# Patient Record
Sex: Female | Born: 1950 | Race: White | Hispanic: No | Marital: Married | Smoking: Former smoker
Health system: Southern US, Community
[De-identification: ages and names within clinical notes are randomized; demographics above are authoritative.]

## PROBLEM LIST (undated history)

## (undated) DIAGNOSIS — J309 Allergic rhinitis, unspecified: Secondary | ICD-10-CM

## (undated) DIAGNOSIS — Z87448 Personal history of other diseases of urinary system: Secondary | ICD-10-CM

## (undated) DIAGNOSIS — N393 Stress incontinence (female) (male): Secondary | ICD-10-CM

## (undated) DIAGNOSIS — F19959 Other psychoactive substance use, unspecified with psychoactive substance-induced psychotic disorder, unspecified: Secondary | ICD-10-CM

## (undated) DIAGNOSIS — K5090 Crohn's disease, unspecified, without complications: Secondary | ICD-10-CM

## (undated) DIAGNOSIS — C801 Malignant (primary) neoplasm, unspecified: Secondary | ICD-10-CM

## (undated) DIAGNOSIS — R112 Nausea with vomiting, unspecified: Secondary | ICD-10-CM

## (undated) DIAGNOSIS — R569 Unspecified convulsions: Secondary | ICD-10-CM

## (undated) DIAGNOSIS — Z8719 Personal history of other diseases of the digestive system: Secondary | ICD-10-CM

## (undated) DIAGNOSIS — M810 Age-related osteoporosis without current pathological fracture: Secondary | ICD-10-CM

## (undated) DIAGNOSIS — Z9889 Other specified postprocedural states: Secondary | ICD-10-CM

## (undated) DIAGNOSIS — M1990 Unspecified osteoarthritis, unspecified site: Secondary | ICD-10-CM

## (undated) DIAGNOSIS — Z8742 Personal history of other diseases of the female genital tract: Secondary | ICD-10-CM

## (undated) DIAGNOSIS — N819 Female genital prolapse, unspecified: Secondary | ICD-10-CM

## (undated) HISTORY — PX: EXPLORATORY LAPAROTOMY: SUR591

## (undated) HISTORY — PX: WISDOM TOOTH EXTRACTION: SHX21

## (undated) HISTORY — PX: OTHER SURGICAL HISTORY: SHX169

---

## 1978-11-03 HISTORY — PX: ABDOMINAL HYSTERECTOMY: SHX81

## 1994-11-03 HISTORY — PX: OTHER SURGICAL HISTORY: SHX169

## 1994-11-03 HISTORY — PX: BREAST EXCISIONAL BIOPSY: SUR124

## 1999-11-21 ENCOUNTER — Ambulatory Visit (HOSPITAL_COMMUNITY): Admission: RE | Admit: 1999-11-21 | Discharge: 1999-11-21 | Payer: Self-pay | Admitting: Gastroenterology

## 2000-01-31 ENCOUNTER — Encounter: Payer: Self-pay | Admitting: Family Medicine

## 2000-01-31 ENCOUNTER — Encounter: Admission: RE | Admit: 2000-01-31 | Discharge: 2000-01-31 | Payer: Self-pay | Admitting: Family Medicine

## 2000-03-20 ENCOUNTER — Encounter: Admission: RE | Admit: 2000-03-20 | Discharge: 2000-03-20 | Payer: Self-pay | Admitting: Obstetrics and Gynecology

## 2000-03-20 ENCOUNTER — Encounter: Payer: Self-pay | Admitting: Obstetrics and Gynecology

## 2000-06-08 ENCOUNTER — Ambulatory Visit (HOSPITAL_COMMUNITY): Admission: RE | Admit: 2000-06-08 | Discharge: 2000-06-08 | Payer: Self-pay | Admitting: Obstetrics and Gynecology

## 2000-06-08 ENCOUNTER — Encounter (INDEPENDENT_AMBULATORY_CARE_PROVIDER_SITE_OTHER): Payer: Self-pay | Admitting: Specialist

## 2000-06-08 HISTORY — PX: OTHER SURGICAL HISTORY: SHX169

## 2001-03-25 ENCOUNTER — Encounter: Payer: Self-pay | Admitting: Internal Medicine

## 2001-03-25 ENCOUNTER — Encounter: Admission: RE | Admit: 2001-03-25 | Discharge: 2001-03-25 | Payer: Self-pay | Admitting: Internal Medicine

## 2002-03-29 ENCOUNTER — Encounter: Payer: Self-pay | Admitting: Internal Medicine

## 2002-03-29 ENCOUNTER — Encounter: Admission: RE | Admit: 2002-03-29 | Discharge: 2002-03-29 | Payer: Self-pay | Admitting: Internal Medicine

## 2003-04-04 ENCOUNTER — Encounter: Payer: Self-pay | Admitting: Internal Medicine

## 2003-04-04 ENCOUNTER — Encounter: Admission: RE | Admit: 2003-04-04 | Discharge: 2003-04-04 | Payer: Self-pay | Admitting: Internal Medicine

## 2003-06-14 ENCOUNTER — Encounter: Payer: Self-pay | Admitting: Internal Medicine

## 2003-06-14 ENCOUNTER — Encounter: Admission: RE | Admit: 2003-06-14 | Discharge: 2003-06-14 | Payer: Self-pay | Admitting: Internal Medicine

## 2004-04-04 ENCOUNTER — Encounter: Admission: RE | Admit: 2004-04-04 | Discharge: 2004-04-04 | Payer: Self-pay | Admitting: Internal Medicine

## 2005-04-07 ENCOUNTER — Encounter: Admission: RE | Admit: 2005-04-07 | Discharge: 2005-04-07 | Payer: Self-pay | Admitting: Internal Medicine

## 2005-05-13 ENCOUNTER — Encounter: Admission: RE | Admit: 2005-05-13 | Discharge: 2005-05-13 | Payer: Self-pay | Admitting: Internal Medicine

## 2005-09-29 ENCOUNTER — Encounter: Admission: RE | Admit: 2005-09-29 | Discharge: 2005-09-29 | Payer: Self-pay | Admitting: Family Medicine

## 2006-04-08 ENCOUNTER — Encounter: Admission: RE | Admit: 2006-04-08 | Discharge: 2006-04-08 | Payer: Self-pay | Admitting: Internal Medicine

## 2007-04-21 ENCOUNTER — Encounter: Admission: RE | Admit: 2007-04-21 | Discharge: 2007-04-21 | Payer: Self-pay | Admitting: *Deleted

## 2008-04-21 ENCOUNTER — Encounter: Admission: RE | Admit: 2008-04-21 | Discharge: 2008-04-21 | Payer: Self-pay | Admitting: Family Medicine

## 2008-06-06 ENCOUNTER — Encounter: Admission: RE | Admit: 2008-06-06 | Discharge: 2008-06-06 | Payer: Self-pay | Admitting: Family Medicine

## 2008-06-20 ENCOUNTER — Encounter: Admission: RE | Admit: 2008-06-20 | Discharge: 2008-06-20 | Payer: Self-pay | Admitting: Family Medicine

## 2009-04-23 ENCOUNTER — Encounter: Admission: RE | Admit: 2009-04-23 | Discharge: 2009-04-23 | Payer: Self-pay | Admitting: Family Medicine

## 2010-01-03 IMAGING — CR DG HIP (WITH OR WITHOUT PELVIS) 2-3V*L*
2 series · 2 of 2 positions shown · non-contrast
Comparison: CT pelvis of 04/21/2008

CLINICAL DATA: Left hip and groin pain, no known injury

LEFT HIP - COMPLETE 2+ VIEW

[view not recorded (1 of 2)]
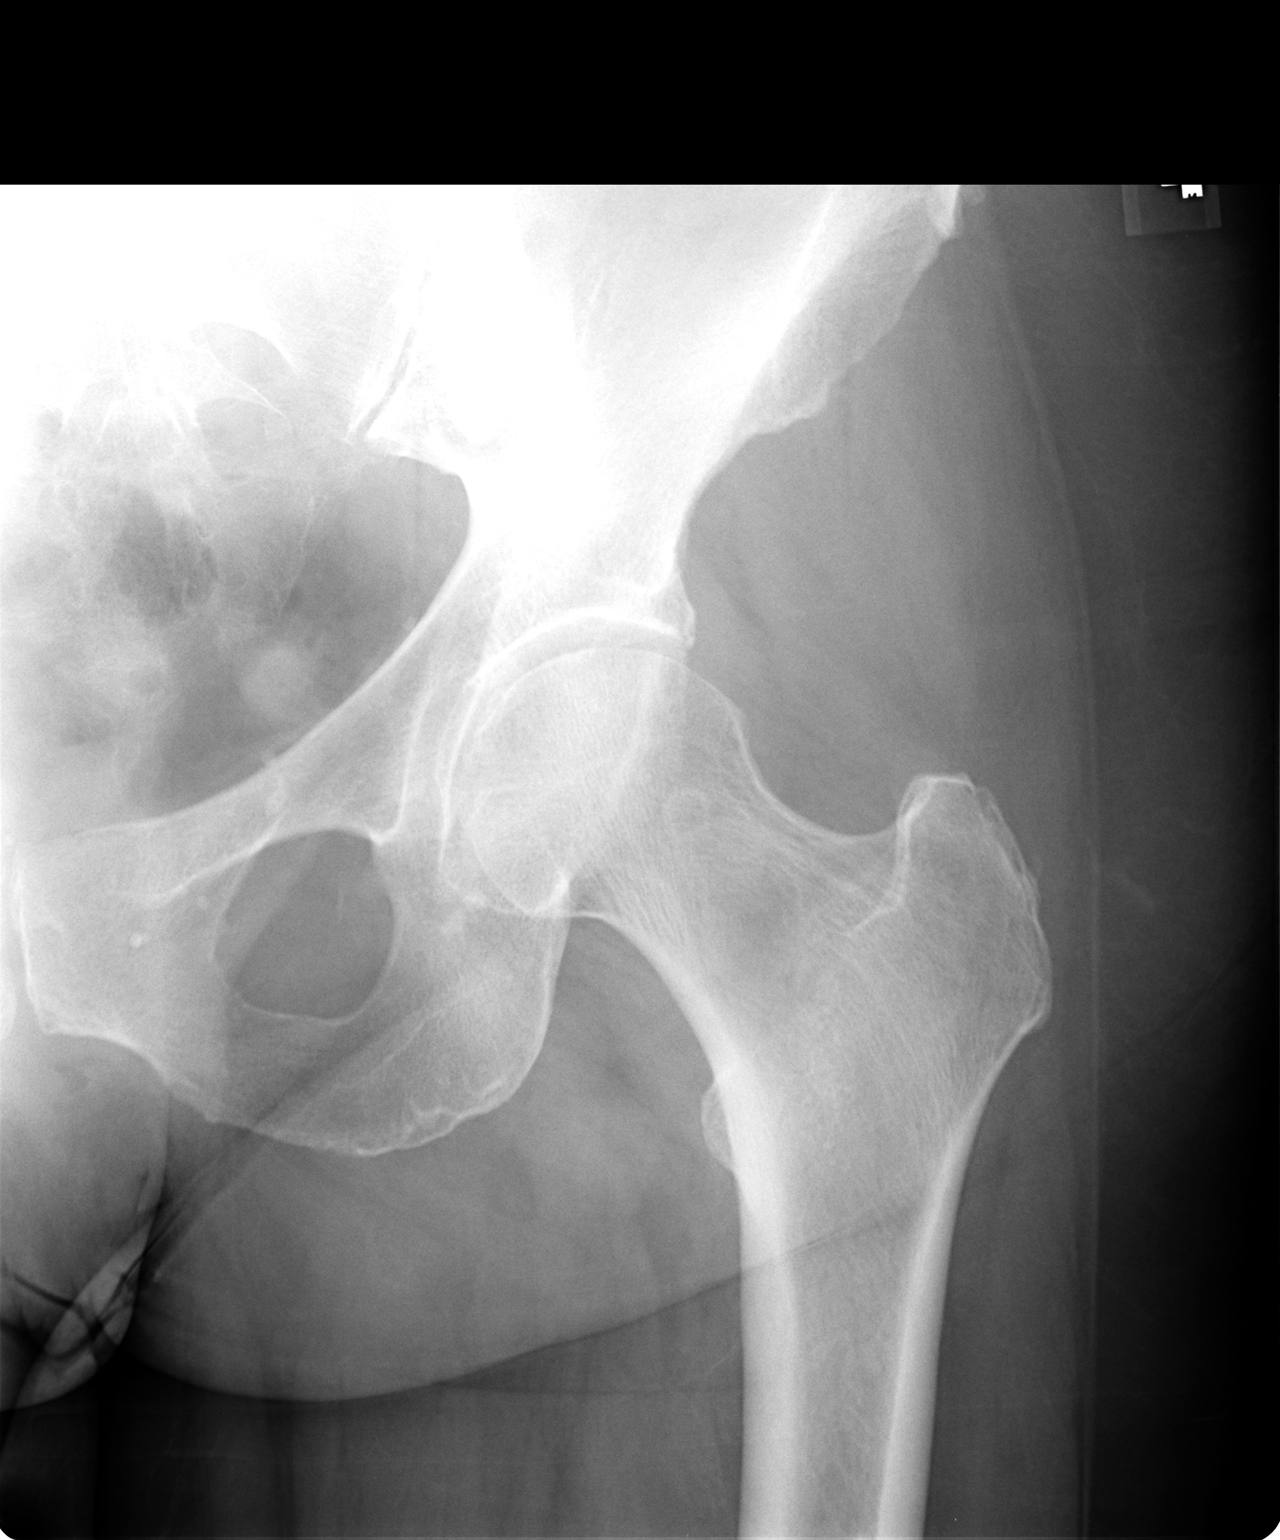

[view not recorded (2 of 2)]
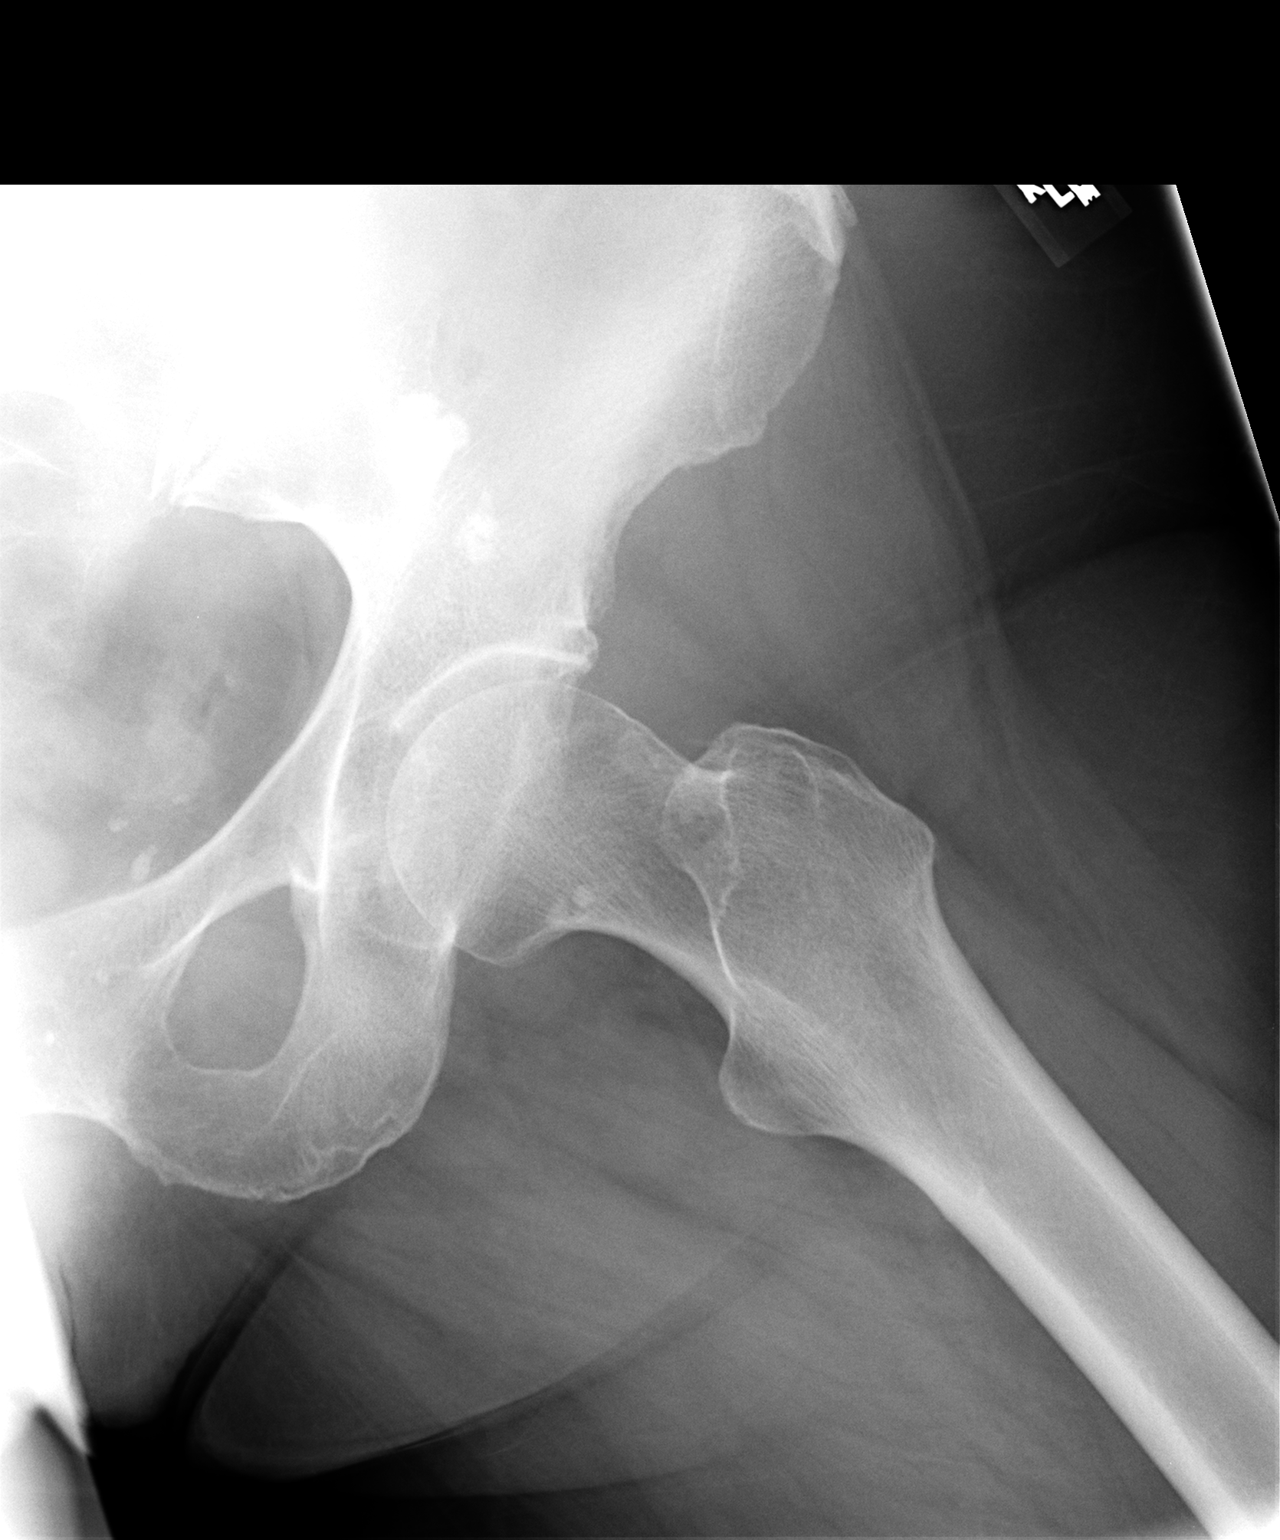

[2 of 2 positions shown; findings below may reference images not displayed]

FINDINGS: The left hip joint space appears normal with no
significant degenerative change.  No acute abnormality is seen.
The left ramus is intact and the left SI joint appears normal.
Calcifications are noted overlying the left iliac bone.  In
correlation with the prior CT of the pelvis, these calcifications
represent calcified left external iliac chain nodes.
IMPRESSION: No significant abnormality.

## 2010-04-24 ENCOUNTER — Encounter: Admission: RE | Admit: 2010-04-24 | Discharge: 2010-04-24 | Payer: Self-pay | Admitting: Family Medicine

## 2010-12-05 ENCOUNTER — Other Ambulatory Visit: Payer: Self-pay | Admitting: Internal Medicine

## 2010-12-05 DIAGNOSIS — Z1231 Encounter for screening mammogram for malignant neoplasm of breast: Secondary | ICD-10-CM

## 2010-12-05 DIAGNOSIS — Z1239 Encounter for other screening for malignant neoplasm of breast: Secondary | ICD-10-CM

## 2010-12-27 ENCOUNTER — Other Ambulatory Visit: Payer: Self-pay | Admitting: Family Medicine

## 2010-12-27 ENCOUNTER — Ambulatory Visit
Admission: RE | Admit: 2010-12-27 | Discharge: 2010-12-27 | Disposition: A | Discharge status: Home / Self Care | Payer: 59 | Source: Ambulatory Visit | Attending: Family Medicine | Admitting: Family Medicine

## 2010-12-27 DIAGNOSIS — R1031 Right lower quadrant pain: Secondary | ICD-10-CM

## 2010-12-27 MED ORDER — IOHEXOL 300 MG/ML  SOLN
100.0000 mL | Freq: Once | INTRAMUSCULAR | Status: AC | PRN
  Administered 2010-12-27: 100 mL via INTRAVENOUS

## 2011-01-08 ENCOUNTER — Emergency Department (INDEPENDENT_AMBULATORY_CARE_PROVIDER_SITE_OTHER): Payer: 59

## 2011-01-08 ENCOUNTER — Emergency Department (HOSPITAL_BASED_OUTPATIENT_CLINIC_OR_DEPARTMENT_OTHER)
Admission: EM | Admit: 2011-01-08 | Discharge: 2011-01-09 | Disposition: A | Discharge status: Nursing Home (Includes ICF) | Payer: 59 | Attending: Emergency Medicine | Admitting: Emergency Medicine

## 2011-01-08 DIAGNOSIS — K56609 Unspecified intestinal obstruction, unspecified as to partial versus complete obstruction: Secondary | ICD-10-CM | POA: Insufficient documentation

## 2011-01-08 DIAGNOSIS — Z79899 Other long term (current) drug therapy: Secondary | ICD-10-CM | POA: Insufficient documentation

## 2011-01-08 DIAGNOSIS — R1110 Vomiting, unspecified: Secondary | ICD-10-CM

## 2011-01-08 DIAGNOSIS — R1033 Periumbilical pain: Secondary | ICD-10-CM | POA: Insufficient documentation

## 2011-01-08 LAB — DIFFERENTIAL
Basophils Absolute: 0 10*3/uL (ref 0.0–0.1)
Basophils Relative: 0 % (ref 0–1)
Lymphocytes Relative: 5 % — ABNORMAL LOW (ref 12–46)
Monocytes Relative: 6 % (ref 3–12)
Neutro Abs: 18.9 10*3/uL — ABNORMAL HIGH (ref 1.7–7.7)

## 2011-01-08 LAB — COMPREHENSIVE METABOLIC PANEL
CO2: 24 mEq/L (ref 19–32)
Chloride: 103 mEq/L (ref 96–112)
GFR calc non Af Amer: >60 mL/min (ref >60)
Sodium: 144 mEq/L (ref 135–145)
Total Bilirubin: 0.8 mg/dL (ref 0.3–1.2)

## 2011-01-08 LAB — URINALYSIS, ROUTINE W REFLEX MICROSCOPIC
Hgb urine dipstick: NEGATIVE
Nitrite: NEGATIVE
Protein, ur: NEGATIVE mg/dL
Specific Gravity, Urine: 1.028 (ref 1.005–1.030)
Urobilinogen, UA: 0.2 mg/dL (ref 0.0–1.0)

## 2011-01-08 LAB — CBC
HCT: 42.3 % (ref 36.0–46.0)
Hemoglobin: 14.6 g/dL (ref 12.0–15.0)
MCV: 86.7 fL (ref 78.0–100.0)
RDW: 13.4 % (ref 11.5–15.5)

## 2011-01-08 MED ORDER — IOHEXOL 300 MG/ML  SOLN
100.0000 mL | Freq: Once | INTRAMUSCULAR | Status: AC | PRN
  Administered 2011-01-08: 100 mL via INTRAVENOUS

## 2011-01-09 ENCOUNTER — Inpatient Hospital Stay (HOSPITAL_COMMUNITY): Payer: 59

## 2011-01-09 ENCOUNTER — Inpatient Hospital Stay (HOSPITAL_COMMUNITY)
Admission: AD | Admit: 2011-01-09 | Discharge: 2011-01-16 | DRG: 390 | Disposition: A | Discharge status: Home / Self Care | Payer: 59 | Source: Other Acute Inpatient Hospital | Attending: Surgery | Admitting: Surgery

## 2011-01-09 DIAGNOSIS — K56609 Unspecified intestinal obstruction, unspecified as to partial versus complete obstruction: Principal | ICD-10-CM | POA: Diagnosis present

## 2011-01-09 DIAGNOSIS — Z87440 Personal history of urinary (tract) infections: Secondary | ICD-10-CM

## 2011-01-09 LAB — BASIC METABOLIC PANEL
Chloride: 105 mEq/L (ref 96–112)
GFR calc non Af Amer: >60 mL/min (ref >60)
Glucose, Bld: 98 mg/dL (ref 70–99)
Potassium: 3.7 mEq/L (ref 3.5–5.1)
Sodium: 139 mEq/L (ref 135–145)

## 2011-01-09 LAB — CBC
HCT: 37.5 % (ref 36.0–46.0)
Hemoglobin: 12.7 g/dL (ref 12.0–15.0)
RDW: 13.8 % (ref 11.5–15.5)

## 2011-01-10 ENCOUNTER — Inpatient Hospital Stay (HOSPITAL_COMMUNITY): Payer: 59

## 2011-01-10 LAB — BASIC METABOLIC PANEL
BUN: 3 mg/dL — ABNORMAL LOW (ref 6–23)
CO2: 28 mEq/L (ref 19–32)
Glucose, Bld: 114 mg/dL — ABNORMAL HIGH (ref 70–99)
Potassium: 3.7 mEq/L (ref 3.5–5.1)
Sodium: 138 mEq/L (ref 135–145)

## 2011-01-10 LAB — CBC
HCT: 37.8 % (ref 36.0–46.0)
Hemoglobin: 12.4 g/dL (ref 12.0–15.0)
MCV: 91.7 fL (ref 78.0–100.0)
WBC: 10.7 10*3/uL — ABNORMAL HIGH (ref 4.0–10.5)

## 2011-01-11 NOTE — H&P (Signed)
NAME:  Diane [name], Diane [name]                ACCOUNT NO.:  [id number]  MEDICAL RECORD NO.:  [id number]           PATIENT TYPE:  I  LOCATION:  5409                         FACILITY:  [facility]  PHYSICIAN:  Stark Klein, MD       DATE OF BIRTH:  01/06/1951  DATE OF ADMISSION:  01/09/2011 DATE OF DISCHARGE:                             HISTORY & PHYSICAL   CHIEF COMPLAINT:  Partial small bowel obstruction.  HISTORY OF PRESENT ILLNESS:  Diane [name] is a 60 year old female who had severe abdominal pain near her umbilicus around 2 weeks ago.  She had nausea, vomiting and was diagnosed with an UTI at that time.  She was placed on Cipro, the pain did resolve, and she felt good for around a week.  She had a CT scan at that time that was to rule out appendicitis and diverticulitis and this was negative.  Last night at 5:00 p.m., she had acute abdominal pain and was on pain medication.  She described this as a stabbing pain that did not get better, no matter what she tried to do.  It was worse with movement.  Since she has gotten several doses of pain medicine and since the NG tube was placed, her pain was significantly better and almost gone.  She has had belching for the last couple of days and no flatus that she is aware of.  She did have a bowel movement today.  Prior to these 2 episodes, she has never experienced pain like this before.  PAST MEDICAL HISTORY:  Significant for UTI recently, also for endometriosis with pelvic pain and left ovarian cyst.  She has had nausea and vomiting with both episodes.  PAST SURGICAL HISTORY:  D and C, hysterectomy and then left salpingo- oophorectomy and lysis adhesions for removal of hydatid cyst.  FAMILY HISTORY:  No significant illnesses that she is aware of.  SOCIAL HISTORY:  She is married and accompanied by her husband.  She does not abuse tobacco, alcohol or drugs, lives in [place].  REVIEW OF SYSTEMS:  Positive for flank pain, otherwise negative  x11 systems.  MEDICATIONS:  She has not been taking anything other than her Zyrtec and Flonase the past couple of days but her normal medications include Zyrtec, multivitamins, Flonase, fish oil, calcium citrate, GLIZONE and RESTASIS.  ALLERGIES:  TOBRADEX, NEOSPORIN, CELEBREX, CHANTIX, and BISPHOSPHONATES.  PHYSICAL EXAMINATION:  GENERAL:  She is sleeping but arousable. VITAL SIGNS:  Temperature is 98.1, heart rate 98, blood pressure 114/52, respiratory rate 20, sats 94% on room air. GENERAL:  Once awakened, she is alert and oriented x3 and does not appear uncomfortable. HEENT:  Normocephalic, atraumatic.  Sclerae are anicteric.  Mucous membranes are moist. NECK:  Supple.  No lymphadenopathy.  No thyromegaly.  Trachea is midline. HEART:  Regular rate and rhythm.  No murmurs. LUNGS:  Clear to auscultation bilaterally. ABDOMEN:  Soft, nondistended, nontender.  Hypoactive bowel sounds are present. EXTREMITIES:  Warm, well perfused without pitting edema. SKIN:  No evidence of rashes. NEURO:  No gross motor sensory deficits. PSYCH:  Mood and affect are normal.  LABORATORY FINDINGS:  UA shows bilirubin and ketones.  Lipase 68, sodium 144, potassium 3.9, chloride 103, CO2 24, BUN 15, creatinine 0.7, glucose 120.  LFTs, alkaline phosphatase is slightly elevated at 118, AST, ALT 25 and 23, albumin 4.5, calcium 10.1.  White count 21.5, hemoglobin and hematocrit 14.6 and 42.3 and platelet count 398,000.  CT scan on February 24 was negative other than some shotty mesenteric lymphadenopathy.  CT on March 8 shows small bowel obstruction with transition point, no free air.  No mass is seen, fecalization of small bowel and thickening of small bowel.  No evidence of free air.  IMPRESSION:  Mr. Diane [name] is a 60 year old female with partial small bowel obstruction.  We will keep her n.p.o., give her NG tube, IV fluids and recheck electrolytes.  She may require exploratory laparotomy if she does  not open up.  I described this plan of care to the patient and her husband.     Stark Klein, MD     FB/MEDQ  D:  01/09/2011  T:  01/09/2011  Job:  [id number]  Electronically Signed by Stark Klein MD on 01/11/2011 06:32:23 AM

## 2011-01-12 ENCOUNTER — Inpatient Hospital Stay (HOSPITAL_COMMUNITY): Payer: 59

## 2011-01-12 LAB — CBC
HCT: 39.4 % (ref 36.0–46.0)
Hemoglobin: 13 g/dL (ref 12.0–15.0)
MCH: 30.2 pg (ref 26.0–34.0)
MCHC: 33 g/dL (ref 30.0–36.0)
MCV: 91.4 fL (ref 78.0–100.0)

## 2011-01-12 LAB — BASIC METABOLIC PANEL
BUN: 5 mg/dL — ABNORMAL LOW (ref 6–23)
CO2: 30 mEq/L (ref 19–32)
Calcium: 9.5 mg/dL (ref 8.4–10.5)
Creatinine, Ser: 0.64 mg/dL (ref 0.4–1.2)
Glucose, Bld: 104 mg/dL — ABNORMAL HIGH (ref 70–99)

## 2011-01-13 LAB — CBC
Hemoglobin: 12.2 g/dL (ref 12.0–15.0)
MCH: 29.9 pg (ref 26.0–34.0)
MCHC: 33 g/dL (ref 30.0–36.0)
Platelets: 297 10*3/uL (ref 150–400)
RDW: 13.5 % (ref 11.5–15.5)

## 2011-01-13 LAB — BASIC METABOLIC PANEL
CO2: 29 mEq/L (ref 19–32)
Calcium: 9 mg/dL (ref 8.4–10.5)
Creatinine, Ser: 0.63 mg/dL (ref 0.4–1.2)
GFR calc Af Amer: >60 mL/min (ref >60)
GFR calc non Af Amer: >60 mL/min (ref >60)

## 2011-01-14 ENCOUNTER — Inpatient Hospital Stay (HOSPITAL_COMMUNITY): Payer: 59

## 2011-01-14 LAB — BASIC METABOLIC PANEL
Calcium: 9.3 mg/dL (ref 8.4–10.5)
Chloride: 100 mEq/L (ref 96–112)
Creatinine, Ser: 0.71 mg/dL (ref 0.4–1.2)
GFR calc Af Amer: >60 mL/min (ref >60)

## 2011-01-14 LAB — CBC
MCH: 29.8 pg (ref 26.0–34.0)
MCHC: 32.7 g/dL (ref 30.0–36.0)
Platelets: 330 10*3/uL (ref 150–400)
RBC: 4.46 MIL/uL (ref 3.87–5.11)

## 2011-01-14 LAB — MAGNESIUM: Magnesium: 1.9 mg/dL (ref 1.5–2.5)

## 2011-01-14 LAB — LIPASE, BLOOD: Lipase: 89 U/L — ABNORMAL HIGH (ref 11–59)

## 2011-01-15 ENCOUNTER — Inpatient Hospital Stay (HOSPITAL_COMMUNITY): Payer: 59

## 2011-01-15 LAB — CBC
Platelets: 290 10*3/uL (ref 150–400)
RDW: 13.7 % (ref 11.5–15.5)
WBC: 9.1 10*3/uL (ref 4.0–10.5)

## 2011-01-15 LAB — DIFFERENTIAL
Basophils Absolute: 0 10*3/uL (ref 0.0–0.1)
Eosinophils Absolute: 0.3 10*3/uL (ref 0.0–0.7)
Eosinophils Relative: 4 % (ref 0–5)

## 2011-01-24 NOTE — Discharge Summary (Signed)
NAME:  Diane [name], Diane [name]                ACCOUNT NO.:  [id number]  MEDICAL RECORD NO.:  [id number]           PATIENT TYPE:  I  LOCATION:  4401                         FACILITY:  [facility]  PHYSICIAN:  Fenton Malling. Lucia Gaskins, M.D.  DATE OF BIRTH:  July 07, 1951  DATE OF ADMISSION:  01/09/2011 DATE OF DISCHARGE:  01/16/2011                        DISCHARGE SUMMARY - REFERRING   ADMISSION DIAGNOSES: 1. Partial small bowel obstruction. 2. Recent urinary tract infection. 3. History of D and C and hysterectomy. 4. History of lysis of adhesions.  DISCHARGE DIAGNOSES: 1. Partial small bowel obstruction. 2. Recent urinary tract infection. 3. History of D and C and hysterectomy. 4. History of lysis of adhesions.  PROCEDURES:  CT of the abdomen and pelvis, January 09, 2011, finding of small bowel obstruction discrete transition point identified at the anterior mid lower  pelvis, no mass was seen in the transition point, small amount of free pelvic fluid, no free air or abscess.  BRIEF HISTORY:  The patient is a 60 year old white female, who presented on January 09, 2011, with abdominal pain near umbilicus started around 2 weeks ago.  She has developed progressive discomfort, nausea, and vomiting.  She is recently diagnosed with UTI and placed on Cipro.  The pain did not resolve.  CT scan was obtained to rule out appendicitis and diverticulitis and this was negative.  On the night prior to admission, January 08, 2011, she developed acute abdominal pain.  She describes as stabbing.  It did not get better.  No matter what she tried, was worse with movement.  She was ultimately seen in the ER here, underwent studies, which led to admission.  PAST MEDICAL HISTORY: 1. Significant for UTI recently 2.Endometriosis with pelvic pain, 3.Left ovarian cyst.  4. She is also reports nausea and vomiting with each of these episodes.  PAST SURGICAL HISTORY:  Includes: 1.D and C, 2. Hysterectomy, left salpingo- oophorectomy with  lysis of adhesions for removal of hydatid cyst.  ALLERGIES:  TOBRADEX, NEOSPORIN, CELEBREX, CHANTIX, AND BIPHOSPHATES.  HOME MEDICATIONS:  Included Zyrtec, Flonase, multivitamin, fish oil, calcium, Glizone, and Restasis.  For further history and physical please see the dictated note.  HOSPITAL COURSE:  The patient was admitted, placed on IVs and bowel rest.  She continued to have problems with the fair amount of drainage over the next several days.  Serial abdominal films were obtained.  The NG was clampped on January 12, 2011, because of the  drainage was diminished.  She subsequently developed further nausea and NG had to be placed back on suction.  Her white count was up slightly on January 13, 2011.  Two-view abdomen was again obtained and the x-ray actually looked better.  She was followed with serial flat plate abdomen, NG was ultimately removed. She developed flatus and then bowel movements.  In the a.m. of January 16, 2011, her symptoms improved.    She was advanced to a regular diet.  She continued to make good improvement and by the evening of January 16, 2011, she was anxious to go home.  She actually had problems with transportation and needed to go tonight or  the evening of January 17, 2011.  It was Dr. Pollie Friar opinion that she was okay to go at this point.  She is to come back if she had any further problems.    We will plan to allow for followup with her primary care, which is [facility].  She sees Ms. Luanne Bras there.  We will discharge her home on her preadmission medicines, which include: Calcitonin nasal spray one spray daily, calcium citrate vitamin D 2 tablets daily, fish oil 1 tablet by mouth, Flonase 2 sprays daily, glucosamine chondroitin one b.i.d., multivitamin one daily, Restasis 1 drop each eye b.i.d., and Zyrtec.  We will have her followup set again with her primary care at [facility].  She can contact Dr. Lucia Gaskins  if she has any further problems.   Lydia Guiles, P.A.   Fenton Malling. Lucia Gaskins, M.D., FACS  WDJ/MEDQ  D:  01/16/2011  T:  01/16/2011  Job:  [id number]  cc:   [facility]  Electronically Signed by Earnstine Regal P.A. on 01/22/2011 12:26:38 PM Electronically Signed by Alphonsa Overall M.D. on 01/24/2011 07:43:06 AM

## 2011-03-21 NOTE — Op Note (Signed)
[facility]  Patient:    Diane [name], Diane [name]                       MRN: [id number] Proc. Date: 06/08/00 Adm. Date:  [id number] Attending:  Gildardo Cranker CCSadie [facility]er [facility] at  [facility].   Operative Report  PREOPERATIVE DIAGNOSIS: 1. Pelvic pain. 2. History of endometriosis. 3. Questionable ovarian cyst.  POSTOPERATIVE DIAGNOSIS: 1. Pelvic pain. 2. Endometriosis. 3. Hydatid cysts of both fallopian tubes. 4. Left ovarian cyst. 5. Pelvic adhesions.  OPERATION: 1. Diagnostic laparoscopy. 2. Laparoscopic lysis of adhesions. 3. Laparoscopic left salpingo-oophorectomy. 4. Laparoscopic removal of right hydatid cyst.  SURGEON:  Eli Hose, M.D.  ANESTHESIA:  General.  DISPOSITION:  Diane [name] is a 60 year old female who presented with pelvic pain. She is status post hysterectomy.  Her pain has made it difficult for her to work on several occasions and she has had several emergency room visits.  An ultrasound showed ovarian cyst.  She understands the indications for her procedure and she accepts the risks of, but not limited to, anesthetic complications, bleeding, infections, and possible damage to the surrounding organs.  She understands that no guarantees can be given concerning the total relief of her discomfort.  FINDINGS:  The uterus was surgically absent.  The right fallopian tube and the right ovary were normal except for two hydatid cysts of Morgagni on the right fallopian tube.  The left fallopian tube contained hydatid cysts.  The left ovary had an ovarian cyst that appeared clear.  The omentum and the bowel were adhered to the left pelvic sidewall and the left posterior cul-de-sac.  There was evidence of endometriosis on the left pelvic sidewall.  The appendix and the bowel appeared normal otherwise.  The liver and the upper abdomen appeared normal.  DESCRIPTION OF PROCEDURE:  The patient was taken to  the operating room where a general anesthetic was given.  The patients abdomen, perineum, and vagina were prepped with Betadine.  A Foley catheter was placed in the bladder. Examination under anesthesia was performed.  The patient was sterilely draped. A padded sponge stick was placed in the vagina.  The subumbilical area was injected with 0.25% Marcaine.  An incision was made and the Veress needle was inserted into the abdominal cavity.  Proper placement was confirmed using the saline drop test.  A pneumoperitoneum was then obtained.  The laparoscopic trocar and then the laparoscope were substituted for the Veress needle.  Two superpubic incisions were made after the skin was injected with 0.25% Marcaine.  Two 5 mm trocars were placed in the lower abdomen.  Pictures were taken of the patients pelvic anatomy.  Using a combination of blunt and sharp dissection, the adhesions between the omentum, the bowel, the left pelvic sidewall, and the posterior cul-de-sac were then lysed.  We were then better able to visualize the pelvis.  There was evidence of endometriosis along the left pelvic side wall by way of chocolate cyst in the adhesions.  The left ureter was identified and followed throughout its course in the pelvis.  We then isolated the left infundibulopelvic ligament using a combination of sharp and blunt dissection.  Two Endoloops of 0 Vicryl were placed around the infundibulopelvic ligament being careful not to damage the ureter or the vital structures underneath.  The ligament was then cut.  The ovary was removed through the umbilical port.  We  then sharply and bluntly removed the hydatid cyst from the right fallopian tube.  The pelvis was irrigated and hemostasis was noted to be adequate throughout.  We then inspected the posterior cul-de-sac and there was no evidence of endometriosis anywhere else.  We were ready to terminate our procedure.  The bowel was carefully inspected and  there was no evidence of damage to the bowel from our trocars.  The pneumoperitoneum was allowed to escape.  A final check was made for hemostasis and hemostasis was adequate.  All instruments were removed.  The incisions were closed using deep and superficial sutures of 4-0 Vicryl.  Sponge, needle and instrument counts were correct on two occasions.  Estimated blood loss for this procedure was 5 cc.  The patient tolerated the procedure well.  She was awakened from her anesthetic without difficulty and taken to the recovery room in stable condition.  The patient was noted to drain clear, yellow urine at the end of this procedure.  FOLLOW-UP:  The patient was given Vicodin 1 to 2 p.o. q.4h. p.r.n. pain.  She will return to see Dr. Raphael Gibney in two to three weeks for follow-up examination.  She was given a copy of the postoperative instruction as prepared by the [place] for patients who have undergone laparoscopy. DD:  06/08/00 TD:  06/08/00 Job: [id number]

## 2011-03-21 NOTE — H&P (Signed)
[place]  Patient:    Diane [name], M[place]                         MRN: [id number] Adm. Date:  05/23/00 Attending:  Eli Hose, M.D. CC:         Landry Corporal, M.D.                         History and Physical  HISTORY OF PRESENT ILLNESS:       The patient is a 60 year old female, para 1-0-0-1, who presents for diagnostic laparoscopy because of abdominal pain. She had a hysterectomy in 1980 because of endometriosis.  She was seen at the local emergency medical care center where she complained of abdominal pain. Evaluation included an ultrasound which showed multiple ovarian cysts on both ovaries with the largest cyst measuring approximately 2 cm.  Her chemistries were within normal limits including an amylase.  Her white blood cell count was 10,300, but the remainder of her CBC was normal.  Her urinalysis was negative.  The patient has had her pain for approximately four months.  In addition, she has severe dyspareunia when her pain is present.  She has had to leave work on two occasions and presented to the emergency department for treatment.  At this time, she is not having menopausal symptoms.  In the past, she has had a D&C and a laparoscopy because of endometriosis.  She denies a history of sexually transmitted infections.  Her most recent Pap smear was in March 2001, and she denies a history of abnormal Pap smears in the past.  Her most recent mammogram was in May 2001, and it was within normal limits.  OBSTETRICAL HISTORY:              The patient has had one vaginal delivery at term.  PAST MEDICAL HISTORY:             The patient says that she was told that she had osteoporosis, and she is currently taking calcium daily along with multivitamins.  She had her wisdom teeth removed in the past.  She has had a breast lumpectomy.  DRUG ALLERGIES:                   None.  SOCIAL HISTORY:                   The patient smokes one-half to  one pack of cigarettes each day.  She denies alcohol use and recreational drug use.  She is married, and she works as a Network engineer.  FAMILY HISTORY:                   The patient has a family history of heart disease, emphysema, rheumatic fever, stroke, and liver problems.  REVIEW OF SYSTEMS:                The patient wears glasses.  PHYSICAL EXAMINATION:  VITAL SIGNS:                      Weight is 153 pounds, height is 5 feet 3 inches.  HEENT:                            Within normal limits.  CHEST:  Clear.  HEART:                            Regular rate and rhythm.  BREASTS:                          Exam deferred on patient request.  BACK:                             Within normal limits.  ABDOMEN:                          Nontender.  No masses appreciated.  EXTREMITIES:                      Within normal limits.  NEUROLOGIC:                       Normal.  PELVIC:                           External genitalia is normal.  Vagina is normal.  Cervix is absent.  Uterus is absent.  Adnexa no masses, nontender. Rectovaginal exam confirms.  ASSESSMENT:                       1. Dyspareunia.                                   2. Abdominal pain.                                   3. Ovarian cyst.  PLAN:                             A long discussion was held with the patient about her above-mentioned diagnoses.  After reviewing her options for management, the patient has decided to proceed with diagnostic laparoscopy. She wishes to maintain her ovaries if at all possible but agrees to a laparoscopic oophorectomy if pathology is suspected.  The patient understands the indications for her procedure, and she accepts the risks of, but not limited to, anesthetic complications, bleeding, infection, and possible damage to the surrounding organs.  She understands that no guarantees can be given concerning relief of her discomfort and her dyspareunia.  She  understands that if her ovaries are removed, that she will need to have hormone replacement therapy.  The risks and benefits of hormone replacement therapy were discussed including the possible risk of breast cancer. DD:  05/20/00 TD:  05/20/00 Job: [id number]

## 2011-03-24 ENCOUNTER — Other Ambulatory Visit: Payer: Self-pay | Admitting: Gastroenterology

## 2011-03-24 ENCOUNTER — Ambulatory Visit
Admission: RE | Admit: 2011-03-24 | Discharge: 2011-03-24 | Disposition: A | Discharge status: Home / Self Care | Payer: 59 | Source: Ambulatory Visit | Attending: Gastroenterology | Admitting: Gastroenterology

## 2011-03-24 MED ORDER — IOHEXOL 300 MG/ML  SOLN
100.0000 mL | Freq: Once | INTRAMUSCULAR | Status: AC | PRN
  Administered 2011-03-24: 100 mL via INTRAVENOUS

## 2011-04-28 ENCOUNTER — Other Ambulatory Visit: Payer: Self-pay | Admitting: Family Medicine

## 2011-04-28 ENCOUNTER — Ambulatory Visit
Admission: RE | Admit: 2011-04-28 | Discharge: 2011-04-28 | Disposition: A | Discharge status: Home / Self Care | Payer: 59 | Source: Ambulatory Visit | Attending: Internal Medicine | Admitting: Internal Medicine

## 2011-04-28 DIAGNOSIS — Z1231 Encounter for screening mammogram for malignant neoplasm of breast: Secondary | ICD-10-CM

## 2011-11-10 ENCOUNTER — Other Ambulatory Visit: Payer: Self-pay | Admitting: Family Medicine

## 2011-11-10 DIAGNOSIS — Z1231 Encounter for screening mammogram for malignant neoplasm of breast: Secondary | ICD-10-CM

## 2012-04-28 ENCOUNTER — Ambulatory Visit
Admission: RE | Admit: 2012-04-28 | Discharge: 2012-04-28 | Disposition: A | Discharge status: Home / Self Care | Payer: 59 | Source: Ambulatory Visit | Attending: Family Medicine | Admitting: Family Medicine

## 2012-04-28 DIAGNOSIS — Z1231 Encounter for screening mammogram for malignant neoplasm of breast: Secondary | ICD-10-CM

## 2012-06-28 ENCOUNTER — Other Ambulatory Visit: Payer: Self-pay | Admitting: Family Medicine

## 2012-06-28 DIAGNOSIS — Z780 Asymptomatic menopausal state: Secondary | ICD-10-CM

## 2012-07-13 ENCOUNTER — Other Ambulatory Visit: Payer: 59

## 2012-08-13 ENCOUNTER — Ambulatory Visit
Admission: RE | Admit: 2012-08-13 | Discharge: 2012-08-13 | Disposition: A | Discharge status: Home / Self Care | Payer: 59 | Source: Ambulatory Visit | Attending: Family Medicine | Admitting: Family Medicine

## 2012-08-13 DIAGNOSIS — Z780 Asymptomatic menopausal state: Secondary | ICD-10-CM

## 2012-11-01 ENCOUNTER — Encounter (HOSPITAL_BASED_OUTPATIENT_CLINIC_OR_DEPARTMENT_OTHER): Payer: Self-pay | Admitting: *Deleted

## 2012-11-01 ENCOUNTER — Emergency Department (HOSPITAL_BASED_OUTPATIENT_CLINIC_OR_DEPARTMENT_OTHER)
Admission: EM | Admit: 2012-11-01 | Discharge: 2012-11-01 | Disposition: A | Discharge status: Home / Self Care | Payer: 59 | Attending: Emergency Medicine | Admitting: Emergency Medicine

## 2012-11-01 ENCOUNTER — Emergency Department (HOSPITAL_BASED_OUTPATIENT_CLINIC_OR_DEPARTMENT_OTHER): Payer: 59

## 2012-11-01 DIAGNOSIS — K529 Noninfective gastroenteritis and colitis, unspecified: Secondary | ICD-10-CM

## 2012-11-01 DIAGNOSIS — F17200 Nicotine dependence, unspecified, uncomplicated: Secondary | ICD-10-CM | POA: Insufficient documentation

## 2012-11-01 DIAGNOSIS — R1110 Vomiting, unspecified: Secondary | ICD-10-CM | POA: Insufficient documentation

## 2012-11-01 DIAGNOSIS — R3915 Urgency of urination: Secondary | ICD-10-CM | POA: Insufficient documentation

## 2012-11-01 DIAGNOSIS — Z79899 Other long term (current) drug therapy: Secondary | ICD-10-CM | POA: Insufficient documentation

## 2012-11-01 DIAGNOSIS — K5289 Other specified noninfective gastroenteritis and colitis: Secondary | ICD-10-CM | POA: Insufficient documentation

## 2012-11-01 LAB — URINALYSIS, ROUTINE W REFLEX MICROSCOPIC
Bilirubin Urine: NEGATIVE
Glucose, UA: NEGATIVE mg/dL
Hgb urine dipstick: NEGATIVE
Ketones, ur: NEGATIVE mg/dL
Leukocytes, UA: NEGATIVE
Nitrite: NEGATIVE
Protein, ur: NEGATIVE mg/dL
Specific Gravity, Urine: 1.017 (ref 1.005–1.030)
Urobilinogen, UA: 0.2 mg/dL (ref 0.0–1.0)
pH: 8 (ref 5.0–8.0)

## 2012-11-01 LAB — COMPREHENSIVE METABOLIC PANEL
CO2: 24 mEq/L (ref 19–32)
Calcium: 8.5 mg/dL (ref 8.4–10.5)
Creatinine, Ser: 0.6 mg/dL (ref 0.50–1.10)
GFR calc Af Amer: >90 mL/min (ref >90)
GFR calc non Af Amer: >90 mL/min (ref >90)
Glucose, Bld: 104 mg/dL — ABNORMAL HIGH (ref 70–99)
Total Bilirubin: 0.3 mg/dL (ref 0.3–1.2)

## 2012-11-01 LAB — CBC WITH DIFFERENTIAL/PLATELET
Eosinophils Relative: 0 % (ref 0–5)
HCT: 39.9 % (ref 36.0–46.0)
Hemoglobin: 13.5 g/dL (ref 12.0–15.0)
Lymphocytes Relative: 15 % (ref 12–46)
Lymphs Abs: 1.7 10*3/uL (ref 0.7–4.0)
MCV: 90.1 fL (ref 78.0–100.0)
Monocytes Absolute: 1.9 10*3/uL — ABNORMAL HIGH (ref 0.1–1.0)
Monocytes Relative: 17 % — ABNORMAL HIGH (ref 3–12)
RBC: 4.43 MIL/uL (ref 3.87–5.11)
WBC: 11.1 10*3/uL — ABNORMAL HIGH (ref 4.0–10.5)

## 2012-11-01 MED ORDER — IOHEXOL 300 MG/ML  SOLN
100.0000 mL | Freq: Once | INTRAMUSCULAR | Status: AC | PRN
  Administered 2012-11-01: 100 mL via INTRAVENOUS

## 2012-11-01 MED ORDER — ONDANSETRON HCL 4 MG PO TABS: 4.0000 mg | ORAL_TABLET | Freq: Four times a day (QID) | ORAL | Status: DC

## 2012-11-01 MED ORDER — HYDROCODONE-ACETAMINOPHEN 5-325 MG PO TABS: 2.0000 | ORAL_TABLET | ORAL | Status: DC | PRN

## 2012-11-01 MED ORDER — CIPROFLOXACIN HCL 500 MG PO TABS: 500.0000 mg | ORAL_TABLET | Freq: Two times a day (BID) | ORAL | Status: DC

## 2012-11-01 MED ORDER — METRONIDAZOLE 500 MG PO TABS: 500.0000 mg | ORAL_TABLET | Freq: Two times a day (BID) | ORAL | Status: DC

## 2012-11-01 MED ORDER — IOHEXOL 300 MG/ML  SOLN
50.0000 mL | Freq: Once | INTRAMUSCULAR | Status: AC | PRN
  Administered 2012-11-01: 50 mL via ORAL

## 2012-11-01 NOTE — ED Provider Notes (Signed)
Medical screening examination/treatment/procedure(s) were performed by non-physician practitioner and as supervising physician I was immediately available for consultation/collaboration.   Saddie Benders. Evva Din, MD 11/01/12 [phone]

## 2012-11-01 NOTE — ED Notes (Signed)
Pt having one month of abdominal pain and has seen numerous doctors believes the GI issues started when she had a prolia shot on 11/22 but has also recently started estrogen

## 2012-11-01 NOTE — ED Notes (Signed)
Alyse Low, PA-C at bedside

## 2012-11-01 NOTE — ED Notes (Signed)
Patient transported to CT ambulatory

## 2012-11-01 NOTE — ED Provider Notes (Signed)
History     CSN: [id number]  Arrival date & time 11/01/12  1052   First MD Initiated Contact with Patient 11/01/12 1213      Chief Complaint  Patient presents with  . Abdominal Pain    (Consider location/radiation/quality/duration/timing/severity/associated sxs/prior treatment) Patient is a 61 y.o. female presenting with abdominal pain. The history is provided by the patient. No language interpreter was used.  Abdominal Pain The primary symptoms of the illness include abdominal pain and vomiting. Episode onset: one month. The onset of the illness was gradual. The problem has been gradually worsening.  Associated with: vomitting once. The patient states that she believes she is currently not pregnant. Risk factors for an acute abdominal problem include a history of abdominal surgery (multiple abdominal surgerys). Additional symptoms associated with the illness include urgency. Symptoms associated with the illness do not include chills. Significant associated medical issues do not include PUD, GERD or inflammatory bowel disease.  Pt reports she recently saw urology because her bladder has dropped,   Pt reports she has pain in right lower abdomen.  Pt reports she noticed pain after an injection of prolia.  Pt unsure is appendix has been removed.  History reviewed. No pertinent past medical history.  Past Surgical History  Procedure Date  . Abdominal hysterectomy     History reviewed. No pertinent family history.  History  Substance Use Topics  . Smoking status: Current Every Day Smoker  . Smokeless tobacco: Not on file  . Alcohol Use: Yes    OB History    Grav Para Term Preterm Abortions TAB SAB Ect Mult Living                  Review of Systems  Constitutional: Negative for chills.  Gastrointestinal: Positive for vomiting and abdominal pain.  Genitourinary: Positive for urgency.  All other systems reviewed and are negative.    Allergies  Actonel; Boniva; Celebrex;  Chantix; Fosamax; Neosporin; and Tobradex  Home Medications   Current Outpatient Rx  Name  Route  Sig  Dispense  Refill  . CETIRIZINE HCL 10 MG PO TABS   Oral   Take 10 mg by mouth daily.         . DENOSUMAB 60 MG/ML [place] SOLN   Subcutaneous   Inject 60 mg into the skin every 6 (six) months. Administer in upper arm, thigh, or abdomen         . ESTROGENS CONJ SYNTHETIC A 0.3 MG PO TABS   Oral   Take 0.3 mg by mouth daily.         Marland Kitchen FLUTICASONE PROPIONATE 50 MCG/ACT NA SUSP   Nasal   Place 2 sprays into the nose daily.           BP 102/60  Pulse 108  Temp 99.1 F (37.3 C) (Oral)  Resp 20  Ht 5' 3"  (1.6 m)  Wt 146 lb (66.225 kg)  BMI 25.86 kg/m2  SpO2 94%  Physical Exam  Nursing note and vitals reviewed. Constitutional: She is oriented to person, place, and time. She appears well-developed and well-nourished.  HENT:  Head: Normocephalic.  Right Ear: External ear normal.  Left Ear: External ear normal.  Eyes: Conjunctivae normal and EOM are normal. Pupils are equal, round, and reactive to light.  Neck: Normal range of motion.  Cardiovascular: Normal rate and normal heart sounds.   Pulmonary/Chest: Effort normal.  Abdominal: Soft. There is tenderness.  Musculoskeletal: Normal range of motion.  Neurological: She is  alert and oriented to person, place, and time.  Skin: Skin is warm.  Psychiatric: She has a normal mood and affect.    ED Course  Procedures (including critical care time)  Labs Reviewed  CBC WITH DIFFERENTIAL - Abnormal; Notable for the following:    WBC 11.1 (*)     Monocytes Relative 17 (*)     Monocytes Absolute 1.9 (*)     All other components within normal limits  COMPREHENSIVE METABOLIC PANEL - Abnormal; Notable for the following:    Glucose, Bld 104 (*)     All other components within normal limits  URINALYSIS, ROUTINE W REFLEX MICROSCOPIC  LIPASE, BLOOD   No results found.   No diagnosis found.    MDM  Pt has slight  elevation of her WBC's on her cbc.   Urine normal.  Ct scan obtained which shows inflammation of the distal ileum on the right side.   I will treat pt with flagyl and cipro.   Pt advised to see her Md for recheck in 2-3 days.  Pt advised to see Gi for complete evaluation       Fransico Meadow, [place] 11/01/12 [place], [place] 11/01/12 [phone]

## 2012-11-01 NOTE — ED Notes (Signed)
Pt is drinking PO contrast

## 2013-02-07 ENCOUNTER — Other Ambulatory Visit: Payer: Self-pay

## 2013-02-07 DIAGNOSIS — Z1231 Encounter for screening mammogram for malignant neoplasm of breast: Secondary | ICD-10-CM

## 2013-04-25 ENCOUNTER — Other Ambulatory Visit: Payer: Self-pay | Admitting: Urology

## 2013-04-29 ENCOUNTER — Ambulatory Visit
Admission: RE | Admit: 2013-04-29 | Discharge: 2013-04-29 | Disposition: A | Discharge status: Home / Self Care | Payer: 59 | Source: Ambulatory Visit

## 2013-04-29 ENCOUNTER — Ambulatory Visit: Payer: 59

## 2013-04-29 DIAGNOSIS — Z1231 Encounter for screening mammogram for malignant neoplasm of breast: Secondary | ICD-10-CM

## 2013-06-03 ENCOUNTER — Encounter (HOSPITAL_BASED_OUTPATIENT_CLINIC_OR_DEPARTMENT_OTHER): Payer: Self-pay | Admitting: *Deleted

## 2013-06-06 ENCOUNTER — Encounter (HOSPITAL_BASED_OUTPATIENT_CLINIC_OR_DEPARTMENT_OTHER): Payer: Self-pay | Admitting: *Deleted

## 2013-06-06 NOTE — Progress Notes (Signed)
NPO AFTER MN. ARRIVES AT 0715. NEEDS HG. WILL DO FLONASE NASAL SPRAY AM OF SURG W/ SIPS OF WATER. REVIEWED [facility] GUIDELINES , WILL BRING MEDS.

## 2013-06-13 ENCOUNTER — Ambulatory Visit (HOSPITAL_BASED_OUTPATIENT_CLINIC_OR_DEPARTMENT_OTHER): Payer: 59 | Admitting: Anesthesiology

## 2013-06-13 ENCOUNTER — Ambulatory Visit (HOSPITAL_BASED_OUTPATIENT_CLINIC_OR_DEPARTMENT_OTHER)
Admission: RE | Admit: 2013-06-13 | Discharge: 2013-06-14 | Disposition: A | Discharge status: Home / Self Care | Payer: 59 | Source: Ambulatory Visit | Attending: Urology | Admitting: Urology

## 2013-06-13 ENCOUNTER — Encounter (HOSPITAL_BASED_OUTPATIENT_CLINIC_OR_DEPARTMENT_OTHER)
Admission: RE | Disposition: A | Discharge status: Home / Self Care | Payer: Self-pay | Source: Ambulatory Visit | Attending: Urology

## 2013-06-13 ENCOUNTER — Encounter (HOSPITAL_BASED_OUTPATIENT_CLINIC_OR_DEPARTMENT_OTHER): Payer: Self-pay

## 2013-06-13 ENCOUNTER — Encounter (HOSPITAL_BASED_OUTPATIENT_CLINIC_OR_DEPARTMENT_OTHER): Payer: Self-pay | Admitting: Anesthesiology

## 2013-06-13 DIAGNOSIS — Z87891 Personal history of nicotine dependence: Secondary | ICD-10-CM | POA: Insufficient documentation

## 2013-06-13 DIAGNOSIS — IMO0002 Reserved for concepts with insufficient information to code with codable children: Secondary | ICD-10-CM

## 2013-06-13 DIAGNOSIS — Z90710 Acquired absence of both cervix and uterus: Secondary | ICD-10-CM | POA: Insufficient documentation

## 2013-06-13 DIAGNOSIS — N393 Stress incontinence (female) (male): Secondary | ICD-10-CM | POA: Insufficient documentation

## 2013-06-13 DIAGNOSIS — N8111 Cystocele, midline: Secondary | ICD-10-CM | POA: Insufficient documentation

## 2013-06-13 DIAGNOSIS — N816 Rectocele: Secondary | ICD-10-CM | POA: Insufficient documentation

## 2013-06-13 DIAGNOSIS — Z79899 Other long term (current) drug therapy: Secondary | ICD-10-CM | POA: Insufficient documentation

## 2013-06-13 HISTORY — DX: Allergic rhinitis, unspecified: J30.9

## 2013-06-13 HISTORY — DX: Nausea with vomiting, unspecified: R11.2

## 2013-06-13 HISTORY — DX: Stress incontinence (female) (male): N39.3

## 2013-06-13 HISTORY — DX: Other specified postprocedural states: Z98.890

## 2013-06-13 HISTORY — DX: Age-related osteoporosis without current pathological fracture: M81.0

## 2013-06-13 HISTORY — PX: PUBOVAGINAL SLING: SHX1035

## 2013-06-13 HISTORY — PX: ANTERIOR AND POSTERIOR REPAIR: SHX5121

## 2013-06-13 HISTORY — DX: Crohn's disease, unspecified, without complications: K50.90

## 2013-06-13 HISTORY — DX: Personal history of other diseases of the female genital tract: Z87.42

## 2013-06-13 HISTORY — PX: CYSTOSCOPY: SHX5120

## 2013-06-13 HISTORY — DX: Unspecified osteoarthritis, unspecified site: M19.90

## 2013-06-13 HISTORY — DX: Personal history of other diseases of the digestive system: Z87.19

## 2013-06-13 HISTORY — DX: Female genital prolapse, unspecified: N81.9

## 2013-06-13 LAB — POCT HEMOGLOBIN-HEMACUE: Hemoglobin: 14.4 g/dL (ref 12.0–15.0)

## 2013-06-13 SURGERY — CREATION, PUBOVAGINAL SLING
Anesthesia: General | Site: Vagina | Wound class: Clean Contaminated

## 2013-06-13 MED ORDER — MEPERIDINE HCL 25 MG/ML IJ SOLN
6.2500 mg | INTRAMUSCULAR | Status: DC | PRN
  Filled 2013-06-13: qty 1

## 2013-06-13 MED ORDER — BUDESONIDE 3 MG PO CP24
9.0000 mg | ORAL_CAPSULE | ORAL | Status: DC
  Filled 2013-06-13: qty 3

## 2013-06-13 MED ORDER — FENTANYL CITRATE 0.05 MG/ML IJ SOLN
25.0000 ug | INTRAMUSCULAR | Status: DC | PRN
  Administered 2013-06-13 (×3): 25 ug via INTRAVENOUS
  Filled 2013-06-13: qty 1

## 2013-06-13 MED ORDER — FENTANYL CITRATE 0.05 MG/ML IJ SOLN
INTRAMUSCULAR | Status: DC | PRN
  Administered 2013-06-13: 25 ug via INTRAVENOUS
  Administered 2013-06-13: 50 ug via INTRAVENOUS
  Administered 2013-06-13: 25 ug via INTRAVENOUS
  Administered 2013-06-13 (×2): 50 ug via INTRAVENOUS

## 2013-06-13 MED ORDER — BISACODYL 10 MG RE SUPP
10.0000 mg | Freq: Every day | RECTAL | Status: DC | PRN
  Filled 2013-06-13: qty 1

## 2013-06-13 MED ORDER — ZOLPIDEM TARTRATE 5 MG PO TABS
5.0000 mg | ORAL_TABLET | Freq: Every evening | ORAL | Status: DC | PRN
  Filled 2013-06-13: qty 1

## 2013-06-13 MED ORDER — STERILE WATER FOR IRRIGATION IR SOLN
Status: DC | PRN
  Administered 2013-06-13: 3000 mL

## 2013-06-13 MED ORDER — SODIUM CHLORIDE 0.45 % IV SOLN
INTRAVENOUS | Status: DC
  Administered 2013-06-13 – 2013-06-14 (×2): via INTRAVENOUS
  Filled 2013-06-13: qty 1000

## 2013-06-13 MED ORDER — DEXAMETHASONE SODIUM PHOSPHATE 4 MG/ML IJ SOLN
INTRAMUSCULAR | Status: DC | PRN
  Administered 2013-06-13: 10 mg via INTRAVENOUS

## 2013-06-13 MED ORDER — INDIGOTINDISULFONATE SODIUM 8 MG/ML IJ SOLN
INTRAMUSCULAR | Status: DC | PRN
  Administered 2013-06-13: 5 mL via INTRAVENOUS

## 2013-06-13 MED ORDER — LORATADINE 10 MG PO TABS
10.0000 mg | ORAL_TABLET | Freq: Every day | ORAL | Status: DC
  Filled 2013-06-13: qty 1

## 2013-06-13 MED ORDER — DIPHENHYDRAMINE HCL 12.5 MG/5ML PO ELIX
12.5000 mg | ORAL_SOLUTION | Freq: Four times a day (QID) | ORAL | Status: DC | PRN
  Filled 2013-06-13: qty 5

## 2013-06-13 MED ORDER — BELLADONNA ALKALOIDS-OPIUM 16.2-60 MG RE SUPP
RECTAL | Status: DC | PRN
  Administered 2013-06-13: 1 via RECTAL

## 2013-06-13 MED ORDER — SODIUM CHLORIDE 0.9 % IJ SOLN
INTRAMUSCULAR | Status: DC | PRN
  Administered 2013-06-13: 50 mL via INTRAVENOUS

## 2013-06-13 MED ORDER — ONDANSETRON HCL 4 MG/2ML IJ SOLN
INTRAMUSCULAR | Status: DC | PRN
  Administered 2013-06-13: 4 mg via INTRAVENOUS

## 2013-06-13 MED ORDER — HYDROMORPHONE HCL PF 1 MG/ML IJ SOLN
0.5000 mg | INTRAMUSCULAR | Status: DC | PRN
  Filled 2013-06-13: qty 1

## 2013-06-13 MED ORDER — CIPROFLOXACIN HCL 500 MG PO TABS
500.0000 mg | ORAL_TABLET | Freq: Two times a day (BID) | ORAL | Status: DC
  Filled 2013-06-13: qty 1

## 2013-06-13 MED ORDER — BUPIVACAINE-EPINEPHRINE 0.5% -1:200000 IJ SOLN
INTRAMUSCULAR | Status: DC | PRN
  Administered 2013-06-13: 30 mL

## 2013-06-13 MED ORDER — GLYCOPYRROLATE 0.2 MG/ML IJ SOLN
INTRAMUSCULAR | Status: DC | PRN
  Administered 2013-06-13: 0.2 mg via INTRAVENOUS

## 2013-06-13 MED ORDER — ACETAMINOPHEN 10 MG/ML IV SOLN
INTRAVENOUS | Status: DC | PRN
  Administered 2013-06-13: 1000 mg via INTRAVENOUS

## 2013-06-13 MED ORDER — ONDANSETRON HCL 4 MG/2ML IJ SOLN
4.0000 mg | INTRAMUSCULAR | Status: DC | PRN
  Filled 2013-06-13: qty 2

## 2013-06-13 MED ORDER — HYDROCODONE-ACETAMINOPHEN 5-325 MG PO TABS
1.0000 | ORAL_TABLET | ORAL | Status: DC | PRN
  Administered 2013-06-13 – 2013-06-14 (×4): 1 via ORAL
  Filled 2013-06-13: qty 2

## 2013-06-13 MED ORDER — PROPOFOL 10 MG/ML IV BOLUS
INTRAVENOUS | Status: DC | PRN
  Administered 2013-06-13: 200 mg via INTRAVENOUS
  Administered 2013-06-13: 40 mg via INTRAVENOUS

## 2013-06-13 MED ORDER — PROPOFOL INFUSION 10 MG/ML OPTIME
INTRAVENOUS | Status: DC | PRN
  Administered 2013-06-13: 100 ug/kg/min via INTRAVENOUS

## 2013-06-13 MED ORDER — DIPHENHYDRAMINE HCL 50 MG/ML IJ SOLN
12.5000 mg | Freq: Four times a day (QID) | INTRAMUSCULAR | Status: DC | PRN
  Filled 2013-06-13: qty 0.25

## 2013-06-13 MED ORDER — ESTRADIOL 0.1 MG/GM VA CREA
TOPICAL_CREAM | VAGINAL | Status: DC | PRN
  Administered 2013-06-13: 1 via VAGINAL

## 2013-06-13 MED ORDER — LACTATED RINGERS IV SOLN
INTRAVENOUS | Status: DC
  Administered 2013-06-13 (×2): via INTRAVENOUS
  Filled 2013-06-13: qty 1000

## 2013-06-13 MED ORDER — LACTATED RINGERS IV SOLN
INTRAVENOUS | Status: DC
  Filled 2013-06-13: qty 1000

## 2013-06-13 MED ORDER — EPHEDRINE SULFATE 50 MG/ML IJ SOLN
INTRAMUSCULAR | Status: DC | PRN
  Administered 2013-06-13: 10 mg via INTRAVENOUS

## 2013-06-13 MED ORDER — OXYBUTYNIN CHLORIDE 5 MG PO TABS
5.0000 mg | ORAL_TABLET | Freq: Three times a day (TID) | ORAL | Status: DC | PRN
  Filled 2013-06-13: qty 1

## 2013-06-13 MED ORDER — LIDOCAINE HCL (CARDIAC) 20 MG/ML IV SOLN
INTRAVENOUS | Status: DC | PRN
  Administered 2013-06-13: 80 mg via INTRAVENOUS

## 2013-06-13 MED ORDER — MIDAZOLAM HCL 5 MG/5ML IJ SOLN
INTRAMUSCULAR | Status: DC | PRN
  Administered 2013-06-13: 2 mg via INTRAVENOUS

## 2013-06-13 MED ORDER — FLUTICASONE PROPIONATE 50 MCG/ACT NA SUSP
2.0000 | Freq: Every morning | NASAL | Status: DC
  Filled 2013-06-13: qty 16

## 2013-06-13 MED ORDER — CEFAZOLIN SODIUM-DEXTROSE 2-3 GM-% IV SOLR
2.0000 g | INTRAVENOUS | Status: AC
  Administered 2013-06-13: 2 g via INTRAVENOUS
  Filled 2013-06-13: qty 50

## 2013-06-13 SURGICAL SUPPLY — 67 items
ADH SKN CLS APL DERMABOND .7 (GAUZE/BANDAGES/DRESSINGS) ×6
BAG URINE DRAINAGE (UROLOGICAL SUPPLIES) ×4 IMPLANT
BLADE SURG 10 STRL SS (BLADE) ×4 IMPLANT
BLADE SURG 15 STRL LF DISP TIS (BLADE) ×3 IMPLANT
BLADE SURG 15 STRL SS (BLADE) ×4
BLADE SURG ROTATE 9660 (MISCELLANEOUS) ×4 IMPLANT
BOOTIES KNEE HIGH SLOAN (MISCELLANEOUS) ×3 IMPLANT
CANISTER SUCTION 1200CC (MISCELLANEOUS) IMPLANT
CANISTER SUCTION 2500CC (MISCELLANEOUS) ×8 IMPLANT
CATH FOLEY 2WAY SLVR  5CC 16FR (CATHETERS) ×1
CATH FOLEY 2WAY SLVR 5CC 16FR (CATHETERS) ×3 IMPLANT
CLOTH BEACON ORANGE TIMEOUT ST (SAFETY) ×4 IMPLANT
COVER MAYO STAND STRL (DRAPES) ×4 IMPLANT
COVER TABLE BACK 60X90 (DRAPES) ×4 IMPLANT
DERMABOND ADVANCED (GAUZE/BANDAGES/DRESSINGS) ×2
DERMABOND ADVANCED .7 DNX12 (GAUZE/BANDAGES/DRESSINGS) IMPLANT
DEVICE CAPIO SLIM BOX (INSTRUMENTS) IMPLANT
DISSECTOR ROUND CHERRY 3/8 STR (MISCELLANEOUS) IMPLANT
DRAPE CAMERA CLOSED 9X96 (DRAPES) ×4 IMPLANT
DRAPE UNDERBUTTOCKS STRL (DRAPE) ×4 IMPLANT
FLOSEAL 10ML (HEMOSTASIS) IMPLANT
GAUZE SPONGE 4X4 16PLY XRAY LF (GAUZE/BANDAGES/DRESSINGS) IMPLANT
GLOVE BIO SURGEON STRL SZ7.5 (GLOVE) ×4 IMPLANT
GLOVE BIOGEL M 6.5 STRL (GLOVE) ×1 IMPLANT
GLOVE BIOGEL M STER SZ 6 (GLOVE) ×1 IMPLANT
GLOVE INDICATOR 7.5 STRL GRN (GLOVE) ×2 IMPLANT
GLOVE SKINSENSE NS SZ7.5 (GLOVE) ×1
GLOVE SKINSENSE STRL SZ7.5 (GLOVE) IMPLANT
GOWN PREVENTION PLUS LG XLONG (DISPOSABLE) ×5 IMPLANT
GOWN STRL REIN XL XLG (GOWN DISPOSABLE) ×4 IMPLANT
NDL 1/2 CIR CATGUT .05X1.09 (NEEDLE) IMPLANT
NEEDLE 1/2 CIR CATGUT .05X1.09 (NEEDLE) IMPLANT
NEEDLE HYPO 22GX1.5 SAFETY (NEEDLE) ×8 IMPLANT
NS IRRIG 500ML POUR BTL (IV SOLUTION) ×1 IMPLANT
PACKING VAGINAL (PACKING) ×4 IMPLANT
PENCIL BUTTON HOLSTER BLD 10FT (ELECTRODE) ×4 IMPLANT
PLUG CATH AND CAP STER (CATHETERS) ×4 IMPLANT
RETRACTOR LONRSTAR 16.6X16.6CM (MISCELLANEOUS) ×3 IMPLANT
RETRACTOR STAY HOOK 5MM (MISCELLANEOUS) ×4 IMPLANT
RETRACTOR STER APS 16.6X16.6CM (MISCELLANEOUS) ×4
SET IRRIG Y TYPE TUR BLADDER L (SET/KITS/TRAYS/PACK) ×4 IMPLANT
SHEET LAVH (DRAPES) ×4 IMPLANT
SLING LYNX SUPRAPUBIC (Sling) ×1 IMPLANT
SLING SOLYX SYSTEM SIS BX (SLING) IMPLANT
SPONGE LAP 4X18 X RAY DECT (DISPOSABLE) ×4 IMPLANT
SUCTION FRAZIER TIP 10 FR DISP (SUCTIONS) ×4 IMPLANT
SUT ABS MONO DBL WITH NDL 48IN (SUTURE) IMPLANT
SUT ETHILON 2 0 PS N (SUTURE) IMPLANT
SUT MON AB 2-0 SH 27 (SUTURE)
SUT MON AB 2-0 SH27 (SUTURE) IMPLANT
SUT NONABSORB MONO DB W/NDL 48 (SUTURE) IMPLANT
SUT PDS AB 3-0 SH 27 (SUTURE) IMPLANT
SUT SILK 3 0 PS 1 (SUTURE) ×4 IMPLANT
SUT VIC AB 0 CT1 36 (SUTURE) IMPLANT
SUT VIC AB 2-0 CT1 27 (SUTURE)
SUT VIC AB 2-0 CT1 TAPERPNT 27 (SUTURE) IMPLANT
SUT VIC AB 2-0 UR6 27 (SUTURE) ×19 IMPLANT
SYR BULB IRRIGATION 50ML (SYRINGE) ×4 IMPLANT
SYR CONTROL 10ML LL (SYRINGE) ×4 IMPLANT
SYRINGE 10CC LL (SYRINGE) ×4 IMPLANT
SYS PRF KIT VAG SUP UPHOLD (Sling) ×1 IMPLANT
TISSUE REPAIR XENFORM 4X7CM (Tissue) ×1 IMPLANT
TRAY DSU PREP LF (CUSTOM PROCEDURE TRAY) ×4 IMPLANT
TUBE CONNECTING 12X1/4 (SUCTIONS) ×8 IMPLANT
WATER STERILE IRR 3000ML UROMA (IV SOLUTION) ×1 IMPLANT
WATER STERILE IRR 500ML POUR (IV SOLUTION) ×7 IMPLANT
YANKAUER SUCT BULB TIP NO VENT (SUCTIONS) IMPLANT

## 2013-06-13 NOTE — Interval H&P Note (Signed)
History and Physical Interval Note:  06/13/2013 8:29 AM  [place]  has presented today for surgery, with the diagnosis of [place]   The various methods of treatment have been discussed with the patient and family. After consideration of risks, benefits and other options for treatment, the patient has consented to  Procedure(s): PELVIC FLOOR REPAIR AND POSSIBLE BIOLOGICAL VERSE UPHOLD LYNX SLING (N/A) as a surgical intervention .  The patient's history has been reviewed, patient examined, no change in status, stable for surgery.  I have reviewed the patient's chart and labs.  Questions were answered to the patient's satisfaction.     Carolan Clines I

## 2013-06-13 NOTE — Progress Notes (Signed)
Urology Progress Note  Day of Surgery   Subjective: Post operative note: post anterior vault repair and posterior high rectocele and Lynx p/v sling.  Pt walking tonight with assistance.     No acute urologic events overnight. Ambulation:   positive Flatus:    positive Bowel movement  negative  Pain: complete resolution  Objective:  Blood pressure 113/58, pulse 107, temperature 97.6 F (36.4 C), temperature source Oral, resp. rate 18, height 5' 2.5" (1.588 m), weight 67.132 kg (148 lb), SpO2 93.00%.  Physical Exam:  General:  No acute distress, awake Extremities: extremities normal, atraumatic, no cyanosis or edema Genitourinary:   Normal BUS Foley: in place. Packing in place.        Recent Labs     06/13/13  0755  HGB  14.4    No results found for this basename: NA, K, CL, CO2, BUN, CREATININE, CALCIUM, MAGNESIUM, GFRNONAA, GFRAA,  in the last 72 hours   No results found for this basename: PT, INR, APTT,  in the last 72 hours   No components found with this basename: ABG,   Assessment/Plan:  Catheter not removed. Continue any current medications.

## 2013-06-13 NOTE — Transfer of Care (Signed)
Immediate Anesthesia Transfer of Care Note  Patient: Diane [name]  Procedure(s) Performed: Procedure(s): PELVIC FLOOR REPAIR  ,  WITH  UPHOLD  , ANTERIOR VAULT WITH KELLY PLICATION WITH LYNX SLING (N/A) CYSTOSCOPY (N/A)  AUGUEMENTED  RECTOCELE REPAIR WITH XENFORM  Patient Location: PACU  Anesthesia Type:General  Level of Consciousness: sedated and responds to stimulation  Airway & Oxygen Therapy: Patient Spontanous Breathing and Patient connected to nasal cannula oxygen  Post-op Assessment: Report given to PACU RN  Post vital signs: Reviewed and stable  Complications: No apparent anesthesia complications

## 2013-06-13 NOTE — Anesthesia Procedure Notes (Signed)
Procedure Name: LMA Insertion Date/Time: 06/13/2013 8:42 AM Performed by: Bethena Roys T Pre-anesthesia Checklist: Patient identified, Emergency Drugs available, Suction available and Patient being monitored Patient Re-evaluated:Patient Re-evaluated prior to inductionOxygen Delivery Method: Circle System Utilized Preoxygenation: Pre-oxygenation with 100% oxygen Intubation Type: IV induction Ventilation: Mask ventilation without difficulty LMA: LMA inserted LMA Size: 4.0 Number of attempts: 1 Airway Equipment and Method: bite block Placement Confirmation: positive ETCO2 Dental Injury: Teeth and Oropharynx as per pre-operative assessment

## 2013-06-13 NOTE — Anesthesia Postprocedure Evaluation (Signed)
  Anesthesia Post-op Note  Patient: Diane [name]  Procedure(s) Performed: Procedure(s) (LRB): PELVIC FLOOR REPAIR  ,  WITH  UPHOLD  , ANTERIOR VAULT WITH KELLY PLICATION WITH LYNX SLING (N/A) CYSTOSCOPY (N/A)  AUGUEMENTED  RECTOCELE REPAIR WITH XENFORM  Patient Location: PACU  Anesthesia Type: General  Level of Consciousness: awake and alert   Airway and Oxygen Therapy: Patient Spontanous Breathing  Post-op Pain: mild  Post-op Assessment: Post-op Vital signs reviewed, Patient's Cardiovascular Status Stable, Respiratory Function Stable, Patent Airway and No signs of Nausea or vomiting  Last Vitals:  Filed Vitals:   06/13/13 1230  BP: 112/68  Pulse: 81  Temp:   Resp: 14    Post-op Vital Signs: stable   Complications: No apparent anesthesia complications

## 2013-06-13 NOTE — Op Note (Signed)
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 Gaynelle Arabian, MD  First assistant:  None  Anesthesia:  General LMA  Preparation:  After appropriate preanesthesia, the patient was brought to the operating room, placed on the operating table in the dorsal supine position where general LMA anesthesia was induced. She was then replaced in the dorsal lithotomy position and the  pubis was prepped with Betadine solution and draped in usual fashion. The arm band was double checked. The history was double checked.  Review history:  Diane [name] is a 62 year old female returns today for a cystoscopy, renal u/s & to review urodynamics. Hx of a 25-pack-year history of smoking, post hysterectomy in the distant past. The patient complains of cough, laugh, sneeze incontinence. She has been treated with a string since December of 2013, but this was removed 3 days after initial placement due to vaginal pain, and switched to Vagifem 10 mcg vaginally twice weekly. The patient complains of pelvic pressure, vaginal odor, discharge. Examination showed grade 3 cystocele. No rectocele is noted. She's now for video urodynamics.  Video urodynamics was accomplished on 03/09/13, in the sitting position. Maximal cystometric capacity is 560 cc, with first sensation at 300 cc normal desire at 331 cc and strong desire at 3 61 cc. The patient has low amplitude instability triggered by cough and Valsalva.  Cough leak point pressure at 200 cc is 81 cm water with moderate leakage. Valsalva leak point pressure at 200 cc a 73 cm of water with moderate urinary leakage. The measurements were prior to inserting  vaginal packing. This appears to be true stress incontinence. The patient appeared to have triggered instability with cough and Valsalva.  Pressure flow study showed a maximum flow rate of 34 cc H20, with detrusor pressure at maximum flow of 14 cmH20.  The patient has a maximum capacity of 560 cc with positive stress urinary incontinence with and without reduction of prolapse.    Statement of  Likelihood of Success: Excellent. TIME-OUT observed.:  Procedure:  Vaginal inspection revealed hypermobile urethra which appeared to be somewhat gaping. In addition, the patient had a stage III cystocele, which was at the vaginal introitus. With reduction of the cystocele, the patient still had a grade 2 rectocele. Scarring was identified at the apex, and this was marked with a 2-0 Vicryl suture.  Using a blue marking pen, the mid urethra was marked. The urethrovesical angle was identified, and an area 1 cm proximal to this area was also marked for vaginal incision. The rectocele  was also marked for incision. The mid urethra was marked, as well as the area 2 fingerbreadths superior and lateral to the pubic tubercle in the midline.  The patient then underwent injection of Marcaine 0.25% with epinephrine 1:: 200,000,  60 cc transvaginally, in order to hydrodissect the tissue, as well as provide postoperative analgesia, and vascular control. A 5 cm horizontal incision is made 1 cm proximal to the urethrovaginal junction, and subcutaneous tissue is dissected above and below the incision. The cystocele was bluntly and sharply dissected. A small Kelly plication is accomplished using 2-0 Vicryl horizontal mattress suture. The pelvic floor is dissected, and the ischial spines are identified bilaterally. This led to identification of the sacrospinous  ligaments. Following this, the uphold light mesh device was placed using the Cappio Slim suture carrier, and sutures were placed in the sacrospinous ligaments bilaterally, one  finger breath medial to the ischial spines bilaterally.  The anterior vault mesh was then sutured in place around the apex with 3 separate 2-0 Vicryl sutures, and distalward with 3 separate 2-0 Vicryl sutures. The lateral wings were then brought through the ligament, and the mesh was brought into the wound and tightened. The wings of the mesh were then brought across the wound, and cut appropriately. Plastic sleeves were removed and sutures removed. Irrigation was accomplished. The epithelial edges were then reapproximated with 2-0 Vicryl suture.  Attention was then directed to the high rectocele. Additional Marcaine and epinephrine was injected, and again triangular incision was made and a perineoplasty was accomplished. Midline proximal dissection was accomplished in the posterior vaginal epithelium was dissected. Minimal bleeding was noted. Because of the very weak vaginal tissue, selected to augment the repair. A 7 cm x 4 cementer portion of Xeroform was then cut to size, and placed in the wound. Horizontal mattress 2-0 Vicryl sutures are then used to perform a plication, leaving the augmented repair in place. The posterior vaginal epithelium was then closed in running and interrupted fashion with 2-0 Vicryl suture.  Attention was then directed to the urethra, and 2 separate incisions were made measuring 1/2 cm each in the suprapubic area, 2 fingerbreadths lateral to the midline, and 2 fingerbreadths above the midline. Midline incision is made above the urethra, and subcutaneous tissue is dissected. Using the [facility] needles, retropubic needles were placed, and cystoscopy was accomplished after giving indigo carmine. Blue contrast was seen from both ureteral orifices. There was no evidence of mesh within the bladder, no evidence of suture within the bladder, and no evidence of needles within the bladder. The bladder neck moved easily.  The scope was removed after both the 12 and 70 lens  examination. Foley catheter was placed, and the bladder drained of fluid. The Ireland sling was placed, and using a right angle clamp behind the midline mesh, the sling was brought through the retropubic space. The plastic sleeves were then cut, and removed. The mesh sleeves were cut subcutaneously bilaterally. The mesh was smooth against the urethra. The wound was closed with running 2-0 Vicryl suture.  The patient received IV Tylenol at the beginning of the procedure, as well as IV antibiotic. She tolerated procedure well. Vaginal packing was placed using Estrace packing. She was awakened, taken to recovery room in good condition.

## 2013-06-13 NOTE — Anesthesia Preprocedure Evaluation (Addendum)
Anesthesia Evaluation  Patient identified by MRN, date of birth, ID band Patient awake    Reviewed: Allergy & Precautions, H&P , NPO status , Patient's Chart, lab work & pertinent test results  History of Anesthesia Complications (+) PONV and PROLONGED EMERGENCE  Airway Mallampati: II TM Distance: >3 FB Neck ROM: Full    Dental no notable dental hx.    Pulmonary neg pulmonary ROS, former smoker,  breath sounds clear to auscultation  Pulmonary exam normal       Cardiovascular negative cardio ROS  Rhythm:Regular Rate:Normal     Neuro/Psych negative neurological ROS  negative psych ROS   GI/Hepatic negative GI ROS, Neg liver ROS, Chron's   Endo/Other  negative endocrine ROS  Renal/GU negative Renal ROS  negative genitourinary   Musculoskeletal negative musculoskeletal ROS (+)   Abdominal   Peds negative pediatric ROS (+)  Hematology negative hematology ROS (+)   Anesthesia Other Findings   Reproductive/Obstetrics negative OB ROS                          Anesthesia Physical Anesthesia Plan  ASA: II  Anesthesia Plan: General   Post-op Pain Management:    Induction: Intravenous  Airway Management Planned: Oral ETT  Additional Equipment:   Intra-op Plan:   Post-operative Plan: Extubation in OR  Informed Consent: I have reviewed the patients History and Physical, chart, labs and discussed the procedure including the risks, benefits and alternatives for the proposed anesthesia with the patient or authorized representative who has indicated his/her understanding and acceptance.   Dental advisory given  Plan Discussed with: CRNA  Anesthesia Plan Comments: (TIVA)        Anesthesia Quick Evaluation

## 2013-06-13 NOTE — H&P (Signed)
hief Complaint  cc: Dr. Tyrone Sage   Reason For Visit  Cystoscopy, Renal u/s & to review urodynamics   Active Problems Problems  1. Chronic Cystitis 595.2 2. Microscopic Hematuria 599.72 3. 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 Urinary Incontinence 788.30 6. Vaginal Wall Prolapse With Midline Cystocele 618.01  History of Present Illness        Diane [name] is a 62 year old female returns today for a cystoscopy, renal u/s & to review urodynamics.  Hx of a 25-pack-year history of smoking, post hysterectomy in the distant past. The patient complains of cough, laugh, sneeze incontinence. She has been treated with a string since December of 2013, but this was removed 3 days after initial placement due to vaginal pain, and switched to Vagifem 10 mcg vaginally twice weekly. The patient complains of pelvic pressure, vaginal odor, discharge. Examination showed grade 3 cystocele. No rectocele is noted. She's now for video urodynamics.  Video urodynamics was accomplished on 03/09/13, in the sitting position. Maximal cystometric capacity is 560 cc, with first sensation at 300 cc normal desire at 331 cc and strong desire at 3 61 cc. The patient has low amplitude instability triggered by cough and Valsalva.  Cough leak point pressure at 200 cc is 81 cm water with moderate leakage. Valsalva leak  point pressure at 200 cc a 73 cm of water with moderate urinary leakage. The measurements were prior to inserting vaginal packing. This appears to be true stress incontinence. The patient appeared to have triggered instability with cough and Valsalva.  Pressure flow study showed a maximum flow rate of 34 cc H20, with detrusor pressure at maximum flow of 14 cmH20.  The patient has a maximum capacity of 560 cc with positive stress urinary incontinence with and without reduction of prolapse.   Past Medical History Problems  1. History of  Acute Colitis 558.9 2. History of  Arthritis  V13.4 3. History of  Crohn's Disease 555.9  Surgical History Problems  1. History of  Breast Surgery Lumpectomy Right 2. History of  Complete Colonoscopy 3. History of  Exploratory Laparotomy 4. History of  Hysterectomy V45.77 5. History of  Oophorectomy V45.77 6. History of  Oral Surgery Tooth Extraction  Current Meds 1. Calcium TABS; Therapy: (Recorded:05Dec2013) to 2. Cranberry 450 MG Oral Tablet; Therapy: (Recorded:11Jun2014) to 3. Flonase 50 MCG/ACT Nasal Suspension; Therapy: (Recorded:05Dec2013) to 4. Probiotic Oral Capsule; Therapy: (Recorded:05Dec2013) to 5. Prolia 60 MG/ML Subcutaneous Solution; Therapy: (Recorded:05Dec2013) to 6. Tylenol TABS; prn; Therapy: (Recorded:11Jun2014) to 7. Vagifem 10 MCG Vaginal Tablet; Insert one tablet vaginally nightly x 2 weeks, then insert one  tablet twice a week thereafter; Therapy: [id number] to (Last Rx:16Dec2013)  Requested for:  17Dec2013 8. Vitamin D 1000 UNIT Oral Tablet; Therapy: (Recorded:11Jun2014) to 9. ZyrTEC Allergy 10 MG Oral Tablet; Therapy: (Recorded:05Dec2013) to  Allergies Medication  1. CeleBREX CAPS 2. Macrobid CAPS 3. Chantix TABS 4. Actonel TABS 5. Boniva TABS 6. Fosamax TABS 7. Neosporin OINT 8. TobraDex ST SUSP  Family History Problems  1. Paternal history of  Alcoholism 2. Maternal history of  Chronic Obstructive Pulmonary Disease 3. Family history of  Family Health Status Number Of Children 1 daughter 4. Family history of  Father Deceased At Age 69 5. Family history of  Mother Deceased At Age 72 6. Fraternal history of  Rheumatic Fever  Social History Problems  1. Alcohol Use 1 per week 2. Caffeine Use 4-6 per day 3. Former Smoker V15.82 1/2 ppd intermittant x 17yr,  quit 4 yrs ago 4. Marital History - Currently Married 5. Occupation: Retired  Electronics engineer, constitutional, skin, eye, otolaryngeal, hematologic/lymphatic, cardiovascular, pulmonary, endocrine,  musculoskeletal, gastrointestinal, neurological and psychiatric system(s) were reviewed and pertinent findings if present are noted.  Genitourinary: nocturia, incontinence, hematuria, dyspareunia, postmenopausal bleeding and bladder pain.  Gastrointestinal: abdominal pain.  Musculoskeletal: joint pain.  Gastrointestinal: bright red blood per rectum.    Vitals Vital Signs [Data Includes: Last 1 Day]  [id number] 03:19PM  Blood Pressure: 104 / 69 Temperature: 98 F Heart Rate: 85  Physical Exam Constitutional: Well nourished and well developed . No acute distress. The patient appears well hydrated.  ENT:. The ears and nose are normal in appearance.  Neck: The appearance of the neck is normal.  Pulmonary: No respiratory distress.  Cardiovascular:. No peripheral edema.  Abdomen: The abdomen is flat. The abdomen is soft and nontender. No masses are palpated. No CVA tenderness. No hernias are palpable. No hepatosplenomegaly noted.  Genitourinary:  Chaperone Present: kim lewis.  Examination of the external genitalia shows normal female external genitalia. The urethra is normal in appearance, not tender, does not appear stenotic and no urethral caruncle. There is no urethral mass. Urethral hypermobility is present. There is no urethral discharge. No periurethral cyst is identified. There is no urethral prolapse. Vaginal exam demonstrates atrophy and the vaginal epithelium to be poorly estrogenized, but no discharge, no tenderness, no uterine prolapse and no mass. No cystocele is identified. No enterocele is identified. No rectocele is identified. There is no evidence of a vesicovaginal fistula. The cervix is is absent. There is no cervical discharge. The uterus is absent, but non tender. The adnexa are palpably normal. The bladder is normal on palpation and non tender. The anus is normal on inspection.  Lymphatics: The femoral and inguinal nodes are not enlarged or tender.  Skin: Normal skin turgor, no  visible rash and no visible skin lesions.  Neuro/Psych:. Mood and affect are appropriate.    Results/Data Urine [Data Includes: Last 1 Day]   [id number]  COLOR YELLOW   APPEARANCE CLEAR   SPECIFIC GRAVITY 1.010   pH 5.5   GLUCOSE NEG mg/dL  BILIRUBIN NEG   KETONE NEG mg/dL  BLOOD TRACE   PROTEIN NEG mg/dL  UROBILINOGEN 0.2 mg/dL  NITRITE NEG   LEUKOCYTE ESTERASE NEG   SQUAMOUS EPITHELIAL/HPF RARE   WBC 0-2 WBC/hpf  RBC 0-2 RBC/hpf  BACTERIA NONE SEEN   CRYSTALS NONE SEEN   CASTS NONE SEEN    Procedure     Renal u/s today:  Rt kidney - 10.18cm x 1.10cm x 4.49cm x 4.56cm.  Extra renal pelvis noted.  Lt kidney - 10.70cm x 1.04cm x 4.92cm x 5.33cm.  Both kidneys appear normal.  PVR - 43.63cc. Positive Marshall and + Q-tip test .   Procedure: Cystoscopy  Chaperone Present: kim lewis.  Indication: Hematuria. Frequent UTI.  Informed Consent: Risks, benefits, and potential adverse events were discussed and informed consent was obtained from the patient.  Prep: The patient was prepped with betadine.  Anesthesia:. Local anesthesia was administered intraurethrally with 2% lidocaine jelly.  Antibiotic prophylaxis: Ciprofloxacin.  Procedure Note:  Urethral meatus:. No abnormalities.  Anterior urethra: No abnormalities.  Prostatic urethra: No abnormalities.  Bladder: Visulization was clear. The ureteral orifices were in the normal anatomic position bilaterally. A systematic survey of the bladder demonstrated no bladder tumors or stones. Examination of the bladder demonstrated no clot within the bladder and no trabeculation no erythematous mucosa, no  edema and no cellules. The patient tolerated the procedure well.  Complications: None.    Assessment Assessed  1. Vaginal Wall Prolapse With Midline Cystocele 618.01   #1 microhematuia and hx of  tobacco use: kidneys normal and cysto normal. No Rx needed.  #2) Stress urinary incontinence: + Marshall test and + Q tip test today. She is  interested in [employer] study, in addition to Ireland sling.   Plan Vaginal Wall Prolapse With Midline Cystocele (618.01)  1. Follow-up Schedule Surgery Office  Follow-up  Requested for: [id number]   1. Lynx sling and possible research for [employer].  2. Rectal bleeding per Dr. Collene Mares.   Signatures

## 2013-06-14 ENCOUNTER — Encounter (HOSPITAL_BASED_OUTPATIENT_CLINIC_OR_DEPARTMENT_OTHER): Payer: Self-pay | Admitting: Urology

## 2013-06-14 MED ORDER — TRAMADOL-ACETAMINOPHEN 37.5-325 MG PO TABS: 1.0000 | ORAL_TABLET | Freq: Four times a day (QID) | ORAL | Status: DC | PRN

## 2013-06-14 MED ORDER — CIPROFLOXACIN HCL 500 MG PO TABS: 500.0000 mg | ORAL_TABLET | Freq: Two times a day (BID) | ORAL | Status: DC

## 2013-06-14 NOTE — Progress Notes (Signed)
Recovery care finished at 12:35

## 2013-06-14 NOTE — Discharge Summary (Signed)
  Physician Discharge Summary  Patient ID: Diane [name] MRN: [id number] DOB/AGE: 02-Feb-1951 62 y.o.  Admit date: 06/13/2013 Discharge date: 06/14/2013  Admission Diagnoses: [place] INCONTINENCE   Discharge Diagnoses:  Same  Discharged Condition: good  Hospital Course: Surgery 8/11. Up walking. + flatus. Eaten x 3. Taken pain med x 2 "for sleep".   Consults: none Significant Diagnostic Studies:  Treatments: Surgery  Discharge Exam: Blood pressure 96/59, pulse 85, temperature 97.6 F (36.4 C), temperature source Oral, resp. rate 16, height 5' 2.5" (1.588 m), weight 67.132 kg (148 lb), SpO2 95.00%. General appearance: alert and cooperative  Disposition: 01-Home or Self Care  Discharge Orders   Future Orders Complete By Expires     Discharge patient  As directed     Discontinue IV  As directed         Medication List         budesonide 3 MG 24 hr capsule  Commonly known as:  ENTOCORT EC  Take 9 mg by mouth every morning.     CALCIUM + D3 PO  Take 2 tablets by mouth daily.     cetirizine 10 MG tablet  Commonly known as:  ZYRTEC  Take 10 mg by mouth every evening.     ciprofloxacin 500 MG tablet  Commonly known as:  CIPRO  Take 1 tablet (500 mg total) by mouth 2 (two) times daily.     denosumab 60 MG/ML Soln injection  Commonly known as:  PROLIA  Inject 60 mg into the skin every 6 (six) months. Administer in upper arm, thigh, or abdomen     estradiol 25 MCG vaginal tablet  Commonly known as:  VAGIFEM  Place 25 mcg vaginally 2 (two) times a week.     fluticasone 50 MCG/ACT nasal spray  Commonly known as:  FLONASE  Place 2 sprays into the nose every morning.     PROBIOTIC DAILY PO  Take 1 tablet by mouth daily. "ULTRAFLORA BRAND"     traMADol-acetaminophen 37.5-325 MG per tablet  Commonly known as:  ULTRACET  Take 1 tablet by mouth every 6 (six) hours as needed for pain.     Vitamin D3 1000 UNITS Caps  Take 1 capsule by mouth  daily.           Follow-up Information   Follow up with Carolan Clines I, MD. (per appointment)    Contact information:   [address], [place] [address] [phone]      RTC for follow-up Miralax 1 table spoon/day Signed: Carolan Clines I 06/14/2013, 12:54 PM

## 2013-08-13 ENCOUNTER — Telehealth: Payer: Self-pay | Admitting: Gastroenterology

## 2013-08-13 NOTE — Telephone Encounter (Signed)
Since starting imuran 3 days ago spouse has developed nausea and vomiting.  She was advised to stop imuran and contact Dr Collene Mares on 08/15/13.

## 2013-08-16 ENCOUNTER — Other Ambulatory Visit: Payer: Self-pay | Admitting: Gastroenterology

## 2013-08-16 DIAGNOSIS — K5289 Other specified noninfective gastroenteritis and colitis: Secondary | ICD-10-CM

## 2013-08-16 DIAGNOSIS — R197 Diarrhea, unspecified: Secondary | ICD-10-CM

## 2013-08-16 DIAGNOSIS — K625 Hemorrhage of anus and rectum: Secondary | ICD-10-CM

## 2013-08-16 DIAGNOSIS — K5000 Crohn's disease of small intestine without complications: Secondary | ICD-10-CM

## 2013-08-19 ENCOUNTER — Ambulatory Visit
Admission: RE | Admit: 2013-08-19 | Discharge: 2013-08-19 | Disposition: A | Discharge status: Home / Self Care | Payer: 59 | Source: Ambulatory Visit | Attending: Gastroenterology | Admitting: Gastroenterology

## 2013-08-19 DIAGNOSIS — K5289 Other specified noninfective gastroenteritis and colitis: Secondary | ICD-10-CM

## 2013-08-19 DIAGNOSIS — R197 Diarrhea, unspecified: Secondary | ICD-10-CM

## 2013-08-19 DIAGNOSIS — K625 Hemorrhage of anus and rectum: Secondary | ICD-10-CM

## 2013-08-19 DIAGNOSIS — K5000 Crohn's disease of small intestine without complications: Secondary | ICD-10-CM

## 2013-11-04 ENCOUNTER — Other Ambulatory Visit: Payer: Self-pay | Admitting: *Deleted

## 2013-11-04 DIAGNOSIS — M79609 Pain in unspecified limb: Secondary | ICD-10-CM

## 2013-11-23 ENCOUNTER — Telehealth: Payer: Self-pay | Admitting: Vascular Surgery

## 2013-11-23 NOTE — Telephone Encounter (Addendum)
Message copied by Gena Fray on Wed Nov 23, 2013  2:26 PM ------      Message from: Merleen Nicely      Created: Wed Nov 23, 2013 12:48 PM      Regarding: cancellation       Maria needs to cancel her appointment on Feb. 4th there is a confict. She is is going to call us back to reschedule this.            Thanks      Ebony Hail ------  11/23/13: Canceled appt. I have left a message with Dr Wolfgang Phoenix medical assistant vm notifying of cancellation, dpm

## 2013-12-07 ENCOUNTER — Encounter: Payer: 59 | Admitting: Vascular Surgery

## 2013-12-07 ENCOUNTER — Encounter (HOSPITAL_COMMUNITY): Payer: 59

## 2014-04-06 ENCOUNTER — Other Ambulatory Visit: Payer: Self-pay

## 2014-04-06 DIAGNOSIS — Z1231 Encounter for screening mammogram for malignant neoplasm of breast: Secondary | ICD-10-CM

## 2014-04-25 ENCOUNTER — Other Ambulatory Visit: Payer: Self-pay | Admitting: Family

## 2014-04-25 ENCOUNTER — Ambulatory Visit
Admission: RE | Admit: 2014-04-25 | Discharge: 2014-04-25 | Disposition: A | Discharge status: Home / Self Care | Payer: 59 | Source: Ambulatory Visit | Attending: Family | Admitting: Family

## 2014-04-25 DIAGNOSIS — R059 Cough, unspecified: Secondary | ICD-10-CM

## 2014-04-25 DIAGNOSIS — R05 Cough: Secondary | ICD-10-CM

## 2014-04-25 DIAGNOSIS — R509 Fever, unspecified: Secondary | ICD-10-CM

## 2014-04-25 DIAGNOSIS — R062 Wheezing: Secondary | ICD-10-CM

## 2014-05-11 ENCOUNTER — Encounter (INDEPENDENT_AMBULATORY_CARE_PROVIDER_SITE_OTHER): Payer: Self-pay

## 2014-05-11 ENCOUNTER — Ambulatory Visit
Admission: RE | Admit: 2014-05-11 | Discharge: 2014-05-11 | Disposition: A | Discharge status: Home / Self Care | Payer: 59 | Source: Ambulatory Visit

## 2014-05-11 DIAGNOSIS — Z1231 Encounter for screening mammogram for malignant neoplasm of breast: Secondary | ICD-10-CM

## 2014-07-21 ENCOUNTER — Other Ambulatory Visit: Payer: Self-pay | Admitting: Gastroenterology

## 2014-07-21 DIAGNOSIS — R1084 Generalized abdominal pain: Secondary | ICD-10-CM

## 2014-07-25 ENCOUNTER — Ambulatory Visit
Admission: RE | Admit: 2014-07-25 | Discharge: 2014-07-25 | Disposition: A | Discharge status: Home / Self Care | Payer: 59 | Source: Ambulatory Visit | Attending: Gastroenterology | Admitting: Gastroenterology

## 2014-07-25 DIAGNOSIS — R1084 Generalized abdominal pain: Secondary | ICD-10-CM

## 2014-07-25 MED ORDER — IOHEXOL 300 MG/ML  SOLN
100.0000 mL | Freq: Once | INTRAMUSCULAR | Status: AC | PRN
  Administered 2014-07-25: 100 mL via INTRAVENOUS

## 2014-07-28 ENCOUNTER — Emergency Department (HOSPITAL_BASED_OUTPATIENT_CLINIC_OR_DEPARTMENT_OTHER)
Admission: EM | Admit: 2014-07-28 | Discharge: 2014-07-28 | Disposition: A | Discharge status: Home / Self Care | Payer: 59 | Attending: Emergency Medicine | Admitting: Emergency Medicine

## 2014-07-28 ENCOUNTER — Emergency Department (HOSPITAL_BASED_OUTPATIENT_CLINIC_OR_DEPARTMENT_OTHER): Payer: 59

## 2014-07-28 ENCOUNTER — Encounter (HOSPITAL_BASED_OUTPATIENT_CLINIC_OR_DEPARTMENT_OTHER): Payer: Self-pay | Admitting: Emergency Medicine

## 2014-07-28 DIAGNOSIS — Z87891 Personal history of nicotine dependence: Secondary | ICD-10-CM | POA: Diagnosis not present

## 2014-07-28 DIAGNOSIS — IMO0002 Reserved for concepts with insufficient information to code with codable children: Secondary | ICD-10-CM | POA: Diagnosis not present

## 2014-07-28 DIAGNOSIS — K5010 Crohn's disease of large intestine without complications: Secondary | ICD-10-CM | POA: Diagnosis not present

## 2014-07-28 DIAGNOSIS — K50119 Crohn's disease of large intestine with unspecified complications: Secondary | ICD-10-CM

## 2014-07-28 DIAGNOSIS — K8020 Calculus of gallbladder without cholecystitis without obstruction: Secondary | ICD-10-CM | POA: Diagnosis not present

## 2014-07-28 DIAGNOSIS — R109 Unspecified abdominal pain: Secondary | ICD-10-CM | POA: Insufficient documentation

## 2014-07-28 DIAGNOSIS — Z8742 Personal history of other diseases of the female genital tract: Secondary | ICD-10-CM | POA: Diagnosis not present

## 2014-07-28 DIAGNOSIS — Z8739 Personal history of other diseases of the musculoskeletal system and connective tissue: Secondary | ICD-10-CM | POA: Diagnosis not present

## 2014-07-28 DIAGNOSIS — Z79899 Other long term (current) drug therapy: Secondary | ICD-10-CM | POA: Diagnosis not present

## 2014-07-28 LAB — URINALYSIS, ROUTINE W REFLEX MICROSCOPIC
Bilirubin Urine: NEGATIVE
GLUCOSE, UA: NEGATIVE mg/dL
HGB URINE DIPSTICK: NEGATIVE
Ketones, ur: NEGATIVE mg/dL
Leukocytes, UA: NEGATIVE
Nitrite: NEGATIVE
PH: 7.5 (ref 5.0–8.0)
Protein, ur: NEGATIVE mg/dL
SPECIFIC GRAVITY, URINE: 1.003 — AB (ref 1.005–1.030)
UROBILINOGEN UA: 0.2 mg/dL (ref 0.0–1.0)

## 2014-07-28 LAB — CBC WITH DIFFERENTIAL/PLATELET
Basophils Absolute: 0 10*3/uL (ref 0.0–0.1)
Basophils Relative: 0 % (ref 0–1)
EOS PCT: 0 % (ref 0–5)
Eosinophils Absolute: 0.1 10*3/uL (ref 0.0–0.7)
HEMATOCRIT: 38.7 % (ref 36.0–46.0)
HEMOGLOBIN: 12.4 g/dL (ref 12.0–15.0)
LYMPHS ABS: 0.8 10*3/uL (ref 0.7–4.0)
LYMPHS PCT: 5 % — AB (ref 12–46)
MCH: 28.2 pg (ref 26.0–34.0)
MCHC: 32 g/dL (ref 30.0–36.0)
MCV: 88.2 fL (ref 78.0–100.0)
MONOS PCT: 3 % (ref 3–12)
Monocytes Absolute: 0.4 10*3/uL (ref 0.1–1.0)
Neutro Abs: 13.4 10*3/uL — ABNORMAL HIGH (ref 1.7–7.7)
Neutrophils Relative %: 92 % — ABNORMAL HIGH (ref 43–77)
PLATELETS: 432 10*3/uL — AB (ref 150–400)
RBC: 4.39 MIL/uL (ref 3.87–5.11)
RDW: 14.5 % (ref 11.5–15.5)
WBC: 14.6 10*3/uL — AB (ref 4.0–10.5)

## 2014-07-28 LAB — COMPREHENSIVE METABOLIC PANEL
ALK PHOS: 85 U/L (ref 39–117)
ALT: 8 U/L (ref 0–35)
AST: 15 U/L (ref 0–37)
Albumin: 3.5 g/dL (ref 3.5–5.2)
Anion gap: 15 (ref 5–15)
BUN: 9 mg/dL (ref 6–23)
CALCIUM: 9.6 mg/dL (ref 8.4–10.5)
CO2: 23 meq/L (ref 19–32)
Chloride: 102 mEq/L (ref 96–112)
Creatinine, Ser: 0.7 mg/dL (ref 0.50–1.10)
GFR, EST NON AFRICAN AMERICAN: 90 mL/min — AB (ref >90)
GLUCOSE: 107 mg/dL — AB (ref 70–99)
Potassium: 4.1 mEq/L (ref 3.7–5.3)
SODIUM: 140 meq/L (ref 137–147)
Total Bilirubin: 0.3 mg/dL (ref 0.3–1.2)
Total Protein: 8 g/dL (ref 6.0–8.3)

## 2014-07-28 LAB — LIPASE, BLOOD: Lipase: 22 U/L (ref 11–59)

## 2014-07-28 MED ORDER — ONDANSETRON HCL 4 MG/2ML IJ SOLN
4.0000 mg | Freq: Once | INTRAMUSCULAR | Status: AC
  Administered 2014-07-28: 4 mg via INTRAVENOUS
  Filled 2014-07-28: qty 2

## 2014-07-28 MED ORDER — MORPHINE SULFATE 4 MG/ML IJ SOLN
4.0000 mg | Freq: Once | INTRAMUSCULAR | Status: AC
  Administered 2014-07-28: 4 mg via INTRAVENOUS
  Filled 2014-07-28: qty 1

## 2014-07-28 MED ORDER — HYDROCODONE-ACETAMINOPHEN 5-325 MG PO TABS: 1.0000 | ORAL_TABLET | Freq: Four times a day (QID) | ORAL | Status: DC | PRN

## 2014-07-28 NOTE — ED Provider Notes (Signed)
CSN: [id number]     Arrival date & time 07/28/14  1103 History   First MD Initiated Contact with Patient 07/28/14 1155     Chief Complaint  Patient presents with  . Abdominal Pain     (Consider location/radiation/quality/duration/timing/severity/associated sxs/prior Treatment) HPI Comments: Pt states that she has had right sided pain over the last couple of weeks. She states that she was seen by Dr. Sarina Ser on the 20th and had a ct scan which showed gallstones and crohns inflammation. Pt states that she started her prednisone for her crohns this morning. She states that he has more ruq pain in the last couple of days so she was told to come here to be evalauted for gallstone. Denies fever, or vomiting. She states that she had diarrhea until 3 days ago and it stopped.  The history is provided by the patient. No language interpreter was used.    Past Medical History  Diagnosis Date  . History of small bowel obstruction     2011--  [facility] --  RESOLVED -- NO SURGICAL INTERVENTION  . SUI (stress urinary incontinence, female)   . Pelvic prolapse   . Osteoporosis   . History of endometriosis   . PONV (postoperative nausea and vomiting)     AND HARD TO WAKE  . Crohn's disease   . Allergic rhinitis   . Arthritis    Past Surgical History  Procedure Laterality Date  . Laparoscopy/ lysis adhesions/ left salpingoophorectomy/ right ovarian cystectomy  06-08-2000  . Abdominal hysterectomy  1980  . Benign excision right breast cyst  1996  . Exploratory laparotomy  EARLY 1970'S    INFERLITITY  . Pubovaginal sling N/A 06/13/2013    Procedure: PELVIC FLOOR REPAIR  ,  WITH  UPHOLD  , ANTERIOR VAULT WITH KELLY PLICATION WITH LYNX SLING;  Surgeon: Ailene Rud, MD;  Location: [facility];  Service: Urology;  Laterality: N/A;  . Cystoscopy N/A 06/13/2013    Procedure: CYSTOSCOPY;  Surgeon: Ailene Rud, MD;  Location: [facility];  Service: Urology;   Laterality: N/A;  . Anterior and posterior repair  06/13/2013    Procedure:  AUGUEMENTED  RECTOCELE REPAIR WITH XENFORM;  Surgeon: Ailene Rud, MD;  Location: [facility];  Service: Urology;;   No family history on file. History  Substance Use Topics  . Smoking status: Former Smoker -- 0.75 packs/day for 40 years    Types: Cigarettes  . Smokeless tobacco: Never Used  . Alcohol Use: 4.2 oz/week    7 Glasses of wine per week   OB History   Grav Para Term Preterm Abortions TAB SAB Ect Mult Living                 Review of Systems  Constitutional: Negative.   HENT: Negative.   Respiratory: Negative.   Cardiovascular: Negative.   All other systems reviewed and are negative.     Allergies  Celebrex; Actonel; Boniva; Chantix; Fosamax; Neosporin; Nitrofuran derivatives; and Tobradex  Home Medications   Prior to Admission medications   Medication Sig Start Date End Date Taking? Authorizing Provider  budesonide (ENTOCORT EC) 3 MG 24 hr capsule Take 9 mg by mouth every morning.    Historical Provider, MD  Calcium Carb-Cholecalciferol (CALCIUM + D3 PO) Take 2 tablets by mouth daily.    Historical Provider, MD  cetirizine (ZYRTEC) 10 MG tablet Take 10 mg by mouth every evening.     Historical Provider, MD  Cholecalciferol (VITAMIN D3) 1000 UNITS CAPS Take 1 capsule by mouth daily.    Historical Provider, MD  ciprofloxacin (CIPRO) 500 MG tablet Take 1 tablet (500 mg total) by mouth 2 (two) times daily. 06/14/13   Ailene Rud, MD  denosumab (PROLIA) 60 MG/ML SOLN injection Inject 60 mg into the skin every 6 (six) months. Administer in upper arm, thigh, or abdomen    Historical Provider, MD  estradiol (VAGIFEM) 25 MCG vaginal tablet Place 25 mcg vaginally 2 (two) times a week.    Historical Provider, MD  fluticasone (FLONASE) 50 MCG/ACT nasal spray Place 2 sprays into the nose every morning.     Historical Provider, MD  Probiotic Product (PROBIOTIC DAILY PO)  Take 1 tablet by mouth daily. "ULTRAFLORA BRAND"    Historical Provider, MD  traMADol-acetaminophen (ULTRACET) 37.5-325 MG per tablet Take 1 tablet by mouth every 6 (six) hours as needed for pain. 06/14/13   Ailene Rud, MD   BP 120/60  Pulse 92  Temp(Src) 98.4 F (36.9 C) (Oral)  Resp 16  Ht 5' 2.5" (1.588 m)  Wt 150 lb (68.04 kg)  BMI 26.98 kg/m2  SpO2 94% Physical Exam  Nursing note and vitals reviewed. Constitutional: She is oriented to person, place, and time. She appears well-developed and well-nourished.  Cardiovascular: Normal rate and regular rhythm.   Pulmonary/Chest: Effort normal.  Abdominal: Soft. There is tenderness in the right upper quadrant.  Musculoskeletal: Normal range of motion.  Neurological: She is alert and oriented to person, place, and time. Coordination normal.  Skin: Skin is warm and dry.  Psychiatric: She has a normal mood and affect.    ED Course  Procedures (including critical care time) Labs Review Labs Reviewed  URINALYSIS, ROUTINE W REFLEX MICROSCOPIC - Abnormal; Notable for the following:    Specific Gravity, Urine 1.003 (*)    All other components within normal limits  CBC WITH DIFFERENTIAL - Abnormal; Notable for the following:    WBC 14.6 (*)    Platelets 432 (*)    Neutrophils Relative % 92 (*)    Neutro Abs 13.4 (*)    Lymphocytes Relative 5 (*)    All other components within normal limits  COMPREHENSIVE METABOLIC PANEL  LIPASE, BLOOD    Imaging Review US Abdomen Complete  07/28/2014   CLINICAL DATA:  Right-sided abdominal discomfort extending into the epigastric region; history of Crohn's disease and gallstones  EXAM: ULTRASOUND ABDOMEN COMPLETE  COMPARISON:  CT scan of the abdomen and pelvis of July 25, 2014.  FINDINGS: Gallbladder:  There is a mobile 5 mm gallstone demonstrating distal shadowing. There is no gallbladder wall thickening, pericholecystic fluid, or positive sonographic Murphy's sign.  Common bile duct:   Diameter: Evaluation of the common bile duct is limited by bowel gas. Where visualized it measures no more than 2 mm in diameter.  Liver:  The echotexture of the liver is mildly increased which suggests fatty infiltrative change. There is no focal mass or ductal dilation.  IVC:  Bowel gas limits evaluation of the IVC.  Pancreas:  Bowel gas obscures much of the pancreas.  Spleen:  Size and appearance within normal limits.  Right Kidney:  Length: 10 cm. Echogenicity within normal limits. No mass or hydronephrosis visualized. There is an extrarenal pelvis on the right.  Left Kidney:  Length: 10.3 cm. Echogenicity within normal limits. No mass or hydronephrosis visualized.  Abdominal aorta:  No aneurysm visualized.  Other findings:  None.  IMPRESSION: 1. There is  a mobile gallstone without evidence of acute cholecystitis. 2. Fatty infiltrative changes of the liver. 3. No acute urinary tract abnormality is demonstrated. 4. Evaluation of the pancreas was limited by bowel gas.   Electronically Signed   By: David  Martinique   On: 07/28/2014 13:52     EKG Interpretation None      MDM   Final diagnoses:  Gallstone  Crohn's colitis, unspecified complication    No acute cholecystitis noted. Likely related to crohns flare. Pt started on prednisone by gi today. Will send home with hydrocodone. Pt instructed on return precautions and is good with plan    Glendell Docker, NP 07/28/14 1436

## 2014-07-28 NOTE — Discharge Instructions (Signed)
Cholelithiasis °Cholelithiasis (also called gallstones) is a form of gallbladder disease in which gallstones form in your gallbladder. The gallbladder is an organ that stores bile made in the liver, which helps digest fats. Gallstones begin as small crystals and slowly grow into stones. Gallstone pain occurs when the gallbladder spasms and a gallstone is blocking the duct. Pain can also occur when a stone passes out of the duct.  °RISK FACTORS °· Being female.   °· Having multiple pregnancies. Health care providers sometimes advise removing diseased gallbladders before future pregnancies.   °· Being obese. °· Eating a diet heavy in fried foods and fat.   °· Being older than 60 years and increasing age.   °· Prolonged use of medicines containing female hormones.   °· Having diabetes mellitus.   °· Rapidly losing weight.   °· Having a family history of gallstones (heredity).   °SYMPTOMS °· Nausea.   °· Vomiting. °· Abdominal pain.   °· Yellowing of the skin (jaundice).   °· Sudden pain. It may persist from several minutes to several hours. °· Fever.   °· Tenderness to the touch.  °In some cases, when gallstones do not move into the bile duct, people have no pain or symptoms. These are called "silent" gallstones.  °TREATMENT °Silent gallstones do not need treatment. In severe cases, emergency surgery may be required. Options for treatment include: °· Surgery to remove the gallbladder. This is the most common treatment. °· Medicines. These do not always work and may take 6-12 months or more to work. °· Shock wave treatment (extracorporeal biliary lithotripsy). In this treatment an ultrasound machine sends shock waves to the gallbladder to break gallstones into smaller pieces that can pass into the intestines or be dissolved by medicine. °HOME CARE INSTRUCTIONS  °· Only take over-the-counter or prescription medicines for pain, discomfort, or fever as directed by your health care provider.   °· Follow a low-fat diet until  seen again by your health care provider. Fat causes the gallbladder to contract, which can result in pain.   °· Follow up with your health care provider as directed. Attacks are almost always recurrent and surgery is usually required for permanent treatment.   °SEEK IMMEDIATE MEDICAL CARE IF:  °· Your pain increases and is not controlled by medicines.   °· You have a fever or persistent symptoms for more than 2-3 days.   °· You have a fever and your symptoms suddenly get worse.   °· You have persistent nausea and vomiting.   °MAKE SURE YOU:  °· Understand these instructions. °· Will watch your condition. °· Will get help right away if you are not doing well or get worse. °Document Released: 10/16/2005 Document Revised: 06/22/2013 Document Reviewed: 04/13/2013 °Exit[employer]® Patient Information ©2015 [employer]. This information is not intended to replace advice given to you by your health care provider. Make sure you discuss any questions you have with your health care provider. ° °

## 2014-07-28 NOTE — ED Provider Notes (Signed)
Medical screening examination/treatment/procedure(s) were performed by non-physician practitioner and as supervising physician I was immediately available for consultation/collaboration.   Houston Siren III, MD 07/28/14 [phone]

## 2014-07-28 NOTE — ED Notes (Signed)
Intense right sided pain. Reports Crohn's and gallstone on CT this week. Due to start prednisone today for Crohn's flare. Denies n/v

## 2014-08-15 ENCOUNTER — Other Ambulatory Visit: Payer: Self-pay | Admitting: Family Medicine

## 2014-08-15 DIAGNOSIS — M810 Age-related osteoporosis without current pathological fracture: Secondary | ICD-10-CM

## 2014-08-28 ENCOUNTER — Encounter (HOSPITAL_BASED_OUTPATIENT_CLINIC_OR_DEPARTMENT_OTHER): Payer: Self-pay | Admitting: Emergency Medicine

## 2014-08-28 ENCOUNTER — Inpatient Hospital Stay (HOSPITAL_BASED_OUTPATIENT_CLINIC_OR_DEPARTMENT_OTHER)
Admission: EM | Admit: 2014-08-28 | Discharge: 2014-08-30 | DRG: 896 | Disposition: A | Discharge status: Home / Self Care | Payer: 59 | Attending: Internal Medicine | Admitting: Internal Medicine

## 2014-08-28 ENCOUNTER — Emergency Department (HOSPITAL_BASED_OUTPATIENT_CLINIC_OR_DEPARTMENT_OTHER): Payer: 59

## 2014-08-28 DIAGNOSIS — W19XXXA Unspecified fall, initial encounter: Secondary | ICD-10-CM

## 2014-08-28 DIAGNOSIS — T380X5A Adverse effect of glucocorticoids and synthetic analogues, initial encounter: Secondary | ICD-10-CM | POA: Diagnosis present

## 2014-08-28 DIAGNOSIS — Z87891 Personal history of nicotine dependence: Secondary | ICD-10-CM

## 2014-08-28 DIAGNOSIS — F1910 Other psychoactive substance abuse, uncomplicated: Secondary | ICD-10-CM | POA: Diagnosis present

## 2014-08-28 DIAGNOSIS — R000 Tachycardia, unspecified: Secondary | ICD-10-CM | POA: Diagnosis present

## 2014-08-28 DIAGNOSIS — G9340 Encephalopathy, unspecified: Secondary | ICD-10-CM | POA: Diagnosis present

## 2014-08-28 DIAGNOSIS — R40243 Glasgow coma scale score 3-8, unspecified time: Secondary | ICD-10-CM

## 2014-08-28 DIAGNOSIS — M1990 Unspecified osteoarthritis, unspecified site: Secondary | ICD-10-CM | POA: Diagnosis present

## 2014-08-28 DIAGNOSIS — Z90710 Acquired absence of both cervix and uterus: Secondary | ICD-10-CM | POA: Diagnosis not present

## 2014-08-28 DIAGNOSIS — K5090 Crohn's disease, unspecified, without complications: Secondary | ICD-10-CM | POA: Diagnosis present

## 2014-08-28 DIAGNOSIS — F15959 Other stimulant use, unspecified with stimulant-induced psychotic disorder, unspecified: Principal | ICD-10-CM | POA: Diagnosis present

## 2014-08-28 DIAGNOSIS — Y92019 Unspecified place in single-family (private) house as the place of occurrence of the external cause: Secondary | ICD-10-CM | POA: Diagnosis not present

## 2014-08-28 DIAGNOSIS — R451 Restlessness and agitation: Secondary | ICD-10-CM | POA: Diagnosis present

## 2014-08-28 DIAGNOSIS — F19959 Other psychoactive substance use, unspecified with psychoactive substance-induced psychotic disorder, unspecified: Secondary | ICD-10-CM | POA: Diagnosis present

## 2014-08-28 DIAGNOSIS — E440 Moderate protein-calorie malnutrition: Secondary | ICD-10-CM

## 2014-08-28 DIAGNOSIS — R569 Unspecified convulsions: Secondary | ICD-10-CM

## 2014-08-28 DIAGNOSIS — G40409 Other generalized epilepsy and epileptic syndromes, not intractable, without status epilepticus: Secondary | ICD-10-CM | POA: Diagnosis present

## 2014-08-28 DIAGNOSIS — R55 Syncope and collapse: Secondary | ICD-10-CM | POA: Diagnosis present

## 2014-08-28 DIAGNOSIS — Z888 Allergy status to other drugs, medicaments and biological substances status: Secondary | ICD-10-CM

## 2014-08-28 DIAGNOSIS — F309 Manic episode, unspecified: Secondary | ICD-10-CM | POA: Diagnosis present

## 2014-08-28 DIAGNOSIS — F19951 Other psychoactive substance use, unspecified with psychoactive substance-induced psychotic disorder with hallucinations: Secondary | ICD-10-CM

## 2014-08-28 DIAGNOSIS — R4182 Altered mental status, unspecified: Secondary | ICD-10-CM | POA: Diagnosis not present

## 2014-08-28 DIAGNOSIS — M810 Age-related osteoporosis without current pathological fracture: Secondary | ICD-10-CM | POA: Diagnosis present

## 2014-08-28 LAB — CBC WITH DIFFERENTIAL/PLATELET
BASOS ABS: 0 10*3/uL (ref 0.0–0.1)
BASOS PCT: 0 % (ref 0–1)
EOS ABS: 0.2 10*3/uL (ref 0.0–0.7)
EOS PCT: 2 % (ref 0–5)
HEMATOCRIT: 37.3 % (ref 36.0–46.0)
Hemoglobin: 11.8 g/dL — ABNORMAL LOW (ref 12.0–15.0)
Lymphocytes Relative: 22 % (ref 12–46)
Lymphs Abs: 2.1 10*3/uL (ref 0.7–4.0)
MCH: 28.6 pg (ref 26.0–34.0)
MCHC: 31.6 g/dL (ref 30.0–36.0)
MCV: 90.3 fL (ref 78.0–100.0)
MONO ABS: 0.6 10*3/uL (ref 0.1–1.0)
Monocytes Relative: 7 % (ref 3–12)
Neutro Abs: 6.3 10*3/uL (ref 1.7–7.7)
Neutrophils Relative %: 69 % (ref 43–77)
Platelets: 353 10*3/uL (ref 150–400)
RBC: 4.13 MIL/uL (ref 3.87–5.11)
RDW: 15.9 % — AB (ref 11.5–15.5)
WBC: 9.2 10*3/uL (ref 4.0–10.5)

## 2014-08-28 LAB — URINALYSIS, ROUTINE W REFLEX MICROSCOPIC
Bilirubin Urine: NEGATIVE
GLUCOSE, UA: NEGATIVE mg/dL
HGB URINE DIPSTICK: NEGATIVE
Ketones, ur: NEGATIVE mg/dL
Leukocytes, UA: NEGATIVE
Nitrite: NEGATIVE
Protein, ur: NEGATIVE mg/dL
Specific Gravity, Urine: 1.016 (ref 1.005–1.030)
Urobilinogen, UA: 0.2 mg/dL (ref 0.0–1.0)
pH: 5.5 (ref 5.0–8.0)

## 2014-08-28 LAB — COMPREHENSIVE METABOLIC PANEL
ALBUMIN: 3.4 g/dL — AB (ref 3.5–5.2)
ALK PHOS: 95 U/L (ref 39–117)
ALT: 64 U/L — ABNORMAL HIGH (ref 0–35)
ALT: 54 U/L — ABNORMAL HIGH (ref 0–35)
ANION GAP: 22 — AB (ref 5–15)
AST: 52 U/L — AB (ref 0–37)
AST: 43 U/L — ABNORMAL HIGH (ref 0–37)
Albumin: 3.2 g/dL — ABNORMAL LOW (ref 3.5–5.2)
Alkaline Phosphatase: 103 U/L (ref 39–117)
Anion gap: 20 — ABNORMAL HIGH (ref 5–15)
BUN: 12 mg/dL (ref 6–23)
BUN: 8 mg/dL (ref 6–23)
CALCIUM: 9.5 mg/dL (ref 8.4–10.5)
CHLORIDE: 98 meq/L (ref 96–112)
CO2: 22 mEq/L (ref 19–32)
CO2: 19 mEq/L (ref 19–32)
CREATININE: 0.7 mg/dL (ref 0.50–1.10)
CREATININE: 0.61 mg/dL (ref 0.50–1.10)
Calcium: 8.8 mg/dL (ref 8.4–10.5)
Chloride: 96 mEq/L (ref 96–112)
GFR calc Af Amer: >90 mL/min (ref >90)
GFR calc non Af Amer: 90 mL/min — ABNORMAL LOW (ref >90)
GLUCOSE: 98 mg/dL (ref 70–99)
Glucose, Bld: 110 mg/dL — ABNORMAL HIGH (ref 70–99)
POTASSIUM: 3.2 meq/L — AB (ref 3.7–5.3)
Potassium: 3.3 mEq/L — ABNORMAL LOW (ref 3.7–5.3)
Sodium: 138 mEq/L (ref 137–147)
Sodium: 139 mEq/L (ref 137–147)
TOTAL PROTEIN: 7 g/dL (ref 6.0–8.3)
Total Bilirubin: 0.5 mg/dL (ref 0.3–1.2)
Total Bilirubin: 0.5 mg/dL (ref 0.3–1.2)
Total Protein: 6.6 g/dL (ref 6.0–8.3)

## 2014-08-28 LAB — CBC
HCT: 35 % — ABNORMAL LOW (ref 36.0–46.0)
Hemoglobin: 11.2 g/dL — ABNORMAL LOW (ref 12.0–15.0)
MCH: 28.1 pg (ref 26.0–34.0)
MCHC: 32 g/dL (ref 30.0–36.0)
MCV: 87.7 fL (ref 78.0–100.0)
PLATELETS: 290 10*3/uL (ref 150–400)
RBC: 3.99 MIL/uL (ref 3.87–5.11)
RDW: 15.8 % — AB (ref 11.5–15.5)
WBC: 7.1 10*3/uL (ref 4.0–10.5)

## 2014-08-28 LAB — RAPID URINE DRUG SCREEN, HOSP PERFORMED
Amphetamines: NOT DETECTED
Barbiturates: NOT DETECTED
Benzodiazepines: NOT DETECTED
Cocaine: NOT DETECTED
OPIATES: NOT DETECTED
TETRAHYDROCANNABINOL: NOT DETECTED

## 2014-08-28 LAB — CBG MONITORING, ED: Glucose-Capillary: 85 mg/dL (ref 70–99)

## 2014-08-28 LAB — PRO B NATRIURETIC PEPTIDE: PRO B NATRI PEPTIDE: 86.3 pg/mL (ref 0–125)

## 2014-08-28 LAB — ETHANOL: Alcohol, Ethyl (B): <11 mg/dL (ref 0–11)

## 2014-08-28 LAB — SALICYLATE LEVEL

## 2014-08-28 LAB — TROPONIN I: Troponin I: <0.3 ng/mL (ref <0.30)

## 2014-08-28 LAB — ACETAMINOPHEN LEVEL: Acetaminophen (Tylenol), Serum: <15 ug/mL (ref 10–30)

## 2014-08-28 MED ORDER — MERCAPTOPURINE 50 MG PO TABS
50.0000 mg | ORAL_TABLET | Freq: Every day | ORAL | Status: DC
  Administered 2014-08-29 – 2014-08-30 (×2): 50 mg via ORAL
  Filled 2014-08-28 (×2): qty 1

## 2014-08-28 MED ORDER — PREDNISOLONE 5 MG PO TABS: 5.0000 mg | ORAL_TABLET | Freq: Every day | ORAL | Status: DC

## 2014-08-28 MED ORDER — PREDNISOLONE SODIUM PHOSPHATE 10 MG PO TBDP: 5.0000 mg | ORAL_TABLET | Freq: Every day | ORAL | Status: DC

## 2014-08-28 MED ORDER — MESALAMINE ER 250 MG PO CPCR
1500.0000 mg | ORAL_CAPSULE | Freq: Every day | ORAL | Status: DC
  Administered 2014-08-29 – 2014-08-30 (×2): 1500 mg via ORAL
  Filled 2014-08-28 (×2): qty 6

## 2014-08-28 MED ORDER — FESOTERODINE FUMARATE ER 8 MG PO TB24
8.0000 mg | ORAL_TABLET | Freq: Every day | ORAL | Status: DC
  Administered 2014-08-29: 8 mg via ORAL
  Filled 2014-08-28 (×3): qty 1

## 2014-08-28 MED ORDER — PREDNISOLONE 5 MG PO TABS
10.0000 mg | ORAL_TABLET | Freq: Every day | ORAL | Status: DC
  Administered 2014-08-29 – 2014-08-30 (×2): 10 mg via ORAL
  Filled 2014-08-28 (×2): qty 2

## 2014-08-28 MED ORDER — HEPARIN SODIUM (PORCINE) 5000 UNIT/ML IJ SOLN
5000.0000 [IU] | Freq: Three times a day (TID) | INTRAMUSCULAR | Status: DC
  Administered 2014-08-29 – 2014-08-30 (×5): 5000 [IU] via SUBCUTANEOUS
  Filled 2014-08-28 (×8): qty 1

## 2014-08-28 MED ORDER — SODIUM CHLORIDE 0.9 % IJ SOLN
3.0000 mL | Freq: Two times a day (BID) | INTRAMUSCULAR | Status: DC
  Administered 2014-08-28 – 2014-08-29 (×3): 3 mL via INTRAVENOUS

## 2014-08-28 MED ORDER — LORAZEPAM 2 MG/ML IJ SOLN
4.0000 mg | Freq: Once | INTRAMUSCULAR | Status: AC
  Administered 2014-08-28: 2 mg via INTRAVENOUS
  Filled 2014-08-28: qty 2

## 2014-08-28 MED ORDER — LORAZEPAM 2 MG/ML IJ SOLN: 1.0000 mg | Freq: Four times a day (QID) | INTRAMUSCULAR | Status: DC | PRN

## 2014-08-28 MED ORDER — HYDROCODONE-ACETAMINOPHEN 5-325 MG PO TABS: 1.0000 | ORAL_TABLET | Freq: Three times a day (TID) | ORAL | Status: DC | PRN

## 2014-08-28 NOTE — ED Provider Notes (Signed)
CSN: [id number]     Arrival date & time 08/28/14  1520 History  This chart was scribed for Pamella Pert, MD by Molli Posey, ED Scribe. This patient was seen in room MH06/MH06 and the patient's care was started 3:49 PM.      Chief Complaint  Patient presents with  . Altered Mental Status    Patient is a 64 y.o. female presenting with altered mental status. The history is provided by the patient and the spouse. No language interpreter was used.  Altered Mental Status Presenting symptoms: behavior changes and confusion   Severity:  Moderate Most recent episode:  More than 2 days ago Duration:  3 days Timing:  Constant Progression:  Unchanged Context: recent change in medication   Associated symptoms: agitation and hallucinations   Associated symptoms: no abdominal pain, no headaches, no rash and no seizures    HPI Comments: Diane [name] is a 63 y.o. female who presents to the Emergency Department complaining of altered mental status that has worsened the past 3 days. Her husband reports that pt has not slept in 3 days. Her husband reports that sleeping aids have provided no relief to her symtpoms. She reports associated agitation, confusion, visual disturbances and hallucinations. Her husband states she has been on prednisone for the past 2 weeks for her chron's flare up about 1 month ago. Pt states that she is currently on 19 medications. Husband reports she stopped taking her prednisone for 2 days this past week. Pt describes her hallucinations that she is seeing things, and thought her hair was dyed and falling out.   PCP FULP   Past Medical History  Diagnosis Date  . History of small bowel obstruction     2011--  [facility] --  RESOLVED -- NO SURGICAL INTERVENTION  . SUI (stress urinary incontinence, female)   . Pelvic prolapse   . Osteoporosis   . History of endometriosis   . PONV (postoperative nausea and vomiting)     AND HARD TO WAKE  . Crohn's disease   . Allergic  rhinitis   . Arthritis    Past Surgical History  Procedure Laterality Date  . Laparoscopy/ lysis adhesions/ left salpingoophorectomy/ right ovarian cystectomy  06-08-2000  . Abdominal hysterectomy  1980  . Benign excision right breast cyst  1996  . Exploratory laparotomy  EARLY 1970'S    INFERLITITY  . Pubovaginal sling N/A 06/13/2013    Procedure: PELVIC FLOOR REPAIR  ,  WITH  UPHOLD  , ANTERIOR VAULT WITH KELLY PLICATION WITH LYNX SLING;  Surgeon: Ailene Rud, MD;  Location: [facility];  Service: Urology;  Laterality: N/A;  . Cystoscopy N/A 06/13/2013    Procedure: CYSTOSCOPY;  Surgeon: Ailene Rud, MD;  Location: [facility];  Service: Urology;  Laterality: N/A;  . Anterior and posterior repair  06/13/2013    Procedure:  AUGUEMENTED  RECTOCELE REPAIR WITH XENFORM;  Surgeon: Ailene Rud, MD;  Location: [facility];  Service: Urology;;   No family history on file. History  Substance Use Topics  . Smoking status: Former Smoker -- 0.75 packs/day for 40 years    Types: Cigarettes  . Smokeless tobacco: Never Used  . Alcohol Use: 4.2 oz/week    7 Glasses of wine per week   OB History   Grav Para Term Preterm Abortions TAB SAB Ect Mult Living                 Review of  Systems  Constitutional: Negative for appetite change and fatigue.  HENT: Negative for congestion, ear discharge and sinus pressure.   Eyes: Positive for visual disturbance. Negative for discharge.  Respiratory: Negative for cough.   Cardiovascular: Negative for chest pain.  Gastrointestinal: Negative for abdominal pain and diarrhea.  Genitourinary: Negative for frequency and hematuria.  Musculoskeletal: Negative for back pain.  Skin: Negative for rash.  Neurological: Negative for seizures and headaches.  Psychiatric/Behavioral: Positive for hallucinations, confusion, sleep disturbance and agitation. The patient is nervous/anxious.   All other  systems reviewed and are negative.     Allergies  Celebrex; Actonel; Boniva; Chantix; Fosamax; Neosporin; Nitrofuran derivatives; and Tobradex  Home Medications   Prior to Admission medications   Medication Sig Start Date End Date Taking? Authorizing Provider  budesonide (ENTOCORT EC) 3 MG 24 hr capsule Take 9 mg by mouth every morning.    Historical Provider, MD  Calcium Carb-Cholecalciferol (CALCIUM + D3 PO) Take 2 tablets by mouth daily.    Historical Provider, MD  cetirizine (ZYRTEC) 10 MG tablet Take 10 mg by mouth every evening.     Historical Provider, MD  Cholecalciferol (VITAMIN D3) 1000 UNITS CAPS Take 1 capsule by mouth daily.    Historical Provider, MD  ciprofloxacin (CIPRO) 500 MG tablet Take 1 tablet (500 mg total) by mouth 2 (two) times daily. 06/14/13   Ailene Rud, MD  denosumab (PROLIA) 60 MG/ML SOLN injection Inject 60 mg into the skin every 6 (six) months. Administer in upper arm, thigh, or abdomen    Historical Provider, MD  estradiol (VAGIFEM) 25 MCG vaginal tablet Place 25 mcg vaginally 2 (two) times a week.    Historical Provider, MD  fluticasone (FLONASE) 50 MCG/ACT nasal spray Place 2 sprays into the nose every morning.     Historical Provider, MD  HYDROcodone-acetaminophen (NORCO/VICODIN) 5-325 MG per tablet Take 1-2 tablets by mouth every 6 (six) hours as needed. 07/28/14   Glendell Docker, NP  Probiotic Product (PROBIOTIC DAILY PO) Take 1 tablet by mouth daily. "ULTRAFLORA BRAND"    Historical Provider, MD  traMADol-acetaminophen (ULTRACET) 37.5-325 MG per tablet Take 1 tablet by mouth every 6 (six) hours as needed for pain. 06/14/13   Ailene Rud, MD   BP 133/68  Pulse 97  Temp(Src) 98.3 F (36.8 C) (Oral)  Resp 18  Ht 5' 2"  (1.575 m)  Wt 140 lb (63.504 kg)  BMI 25.60 kg/m2  SpO2 98% Physical Exam  Nursing note and vitals reviewed. Constitutional: She appears well-developed and well-nourished.  HENT:  Head: Normocephalic and  atraumatic.  Mouth/Throat: Oropharynx is clear and moist.  Eyes: Conjunctivae and EOM are normal. Pupils are equal, round, and reactive to light.  Neck: Normal range of motion. Neck supple.  Cardiovascular: Normal rate, regular rhythm and normal heart sounds.  Exam reveals no gallop and no friction rub.   No murmur heard. Pulmonary/Chest: Effort normal and breath sounds normal. No respiratory distress. She has no wheezes. She has no rales.  Abdominal: Soft. Bowel sounds are normal. She exhibits no distension. There is no tenderness. There is no rebound and no guarding.  Musculoskeletal: Normal range of motion. She exhibits edema.  Mild pitting edema in bilaterally distal LE's. No focal tenderness in the LE's.   Neurological: She is alert. No cranial nerve deficit. She exhibits normal muscle tone. Coordination normal.  alert, oriented x2, thought it was 2014 speech: normal in context and clarity memory: intact grossly cranial nerves II-XII: intact motor strength: full  proximally and distally no involuntary movements or tremors sensation: intact to light touch diffusely  cerebellar: finger-to-nose and heel-to-shin intact gait: normal forwards and backwards  Skin: Skin is warm and dry.  Psychiatric: Her mood appears anxious. Her speech is tangential.    ED Course  Procedures  DIAGNOSTIC STUDIES: Oxygen Saturation is 98% on RA, normal by my interpretation.    COORDINATION OF CARE: 4:09 PM Discussed treatment plan with pt at bedside and pt agreed to plan.   Labs Review Labs Reviewed  CBC WITH DIFFERENTIAL - Abnormal; Notable for the following:    Hemoglobin 11.8 (*)    RDW 15.9 (*)    All other components within normal limits  COMPREHENSIVE METABOLIC PANEL - Abnormal; Notable for the following:    Potassium 3.3 (*)    Glucose, Bld 110 (*)    Albumin 3.4 (*)    AST 52 (*)    ALT 64 (*)    GFR calc non Af Amer 90 (*)    Anion gap 20 (*)    All other components within normal  limits  SALICYLATE LEVEL - Abnormal; Notable for the following:    Salicylate Lvl <9.6 (*)    All other components within normal limits  URINE CULTURE  PRO B NATRIURETIC PEPTIDE  URINALYSIS, ROUTINE W REFLEX MICROSCOPIC  ACETAMINOPHEN LEVEL  ETHANOL  URINE RAPID DRUG SCREEN (HOSP PERFORMED)  TROPONIN I  CBG MONITORING, ED    Imaging Review Dg Chest 2 View  08/28/2014   CLINICAL DATA:  Altered mental status. Patient has been on multiple medications in the last month and has not slept in 3 days. No complaints of chest pain or shortness of breath.  EXAM: CHEST  2 VIEW  COMPARISON:  04/25/2014.  FINDINGS: Cardiac silhouette is normal in size. Normal mediastinal and hilar contours. Clear lungs. No pleural effusion or pneumothorax.  Bony thorax is intact.  IMPRESSION: No active cardiopulmonary disease.   Electronically Signed   By: Lajean Manes M.D.   On: 08/28/2014 17:19   Ct Head Wo Contrast  08/28/2014   CLINICAL DATA:  Fall, injury, altered mental status, personal history of Crohn's disease  EXAM: CT HEAD WITHOUT CONTRAST  CT CERVICAL SPINE WITHOUT CONTRAST  TECHNIQUE: Multidetector CT imaging of the head and cervical spine was performed following the standard protocol without intravenous contrast. Multiplanar CT image reconstructions of the cervical spine were also generated.  COMPARISON:  None  FINDINGS: CT HEAD FINDINGS  Normal ventricular morphology.  No midline shift or mass effect.  Normal appearance of brain parenchyma.  No intracranial hemorrhage, mass lesion, or evidence acute infarction.  No extra-axial fluid collections.  Bones and sinuses unremarkable.  CT CERVICAL SPINE FINDINGS  Visualized skullbase intact.  Lung apices clear.  Prevertebral soft tissues normal thickness.  Disc space narrowing with endplate spur formation at C4-C5 through C6-C7.  Vertebral body heights maintained without fracture or bone destruction.  Minimal retrolisthesis at C5-C6 and C6-C7.  Multilevel facet  degenerative changes of the cervical spine.  Uncovertebral spurs encroach upon the cervical neural foramina bilaterally at C5-C6 and to a lesser degree C6-C7.  Soft tissues unremarkable.  IMPRESSION: No acute intracranial abnormalities.  Multilevel degenerative disc and facet disease changes of the cervical spine as above.  No acute cervical spine abnormalities.   Electronically Signed   By: Lavonia Dana M.D.   On: 08/28/2014 17:18   Ct Cervical Spine Wo Contrast  08/28/2014   CLINICAL DATA:  Fall, injury, altered mental status,  personal history of Crohn's disease  EXAM: CT HEAD WITHOUT CONTRAST  CT CERVICAL SPINE WITHOUT CONTRAST  TECHNIQUE: Multidetector CT imaging of the head and cervical spine was performed following the standard protocol without intravenous contrast. Multiplanar CT image reconstructions of the cervical spine were also generated.  COMPARISON:  None  FINDINGS: CT HEAD FINDINGS  Normal ventricular morphology.  No midline shift or mass effect.  Normal appearance of brain parenchyma.  No intracranial hemorrhage, mass lesion, or evidence acute infarction.  No extra-axial fluid collections.  Bones and sinuses unremarkable.  CT CERVICAL SPINE FINDINGS  Visualized skullbase intact.  Lung apices clear.  Prevertebral soft tissues normal thickness.  Disc space narrowing with endplate spur formation at C4-C5 through C6-C7.  Vertebral body heights maintained without fracture or bone destruction.  Minimal retrolisthesis at C5-C6 and C6-C7.  Multilevel facet degenerative changes of the cervical spine.  Uncovertebral spurs encroach upon the cervical neural foramina bilaterally at C5-C6 and to a lesser degree C6-C7.  Soft tissues unremarkable.  IMPRESSION: No acute intracranial abnormalities.  Multilevel degenerative disc and facet disease changes of the cervical spine as above.  No acute cervical spine abnormalities.   Electronically Signed   By: Lavonia Dana M.D.   On: 08/28/2014 17:18     EKG  Interpretation   Date/Time:  Monday August 28 2014 16:42:08 EDT Ventricular Rate:  103 PR Interval:  134 QRS Duration: 88 QT Interval:  342 QTC Calculation: 448 R Axis:   43 Text Interpretation:  Sinus tachycardia Otherwise normal ECG no previous  Confirmed by Kilea Mccarey  MD, Gaberiel Youngblood ([id number]) on 08/28/2014 4:48:21 PM      MDM   Final diagnoses:  Altered mental status  Syncope, unspecified syncope type   4:49 PM 63 y.o. female w/ hx of crohns on prednisone who presents with worsening agitation, insomnia, and mild confusion which has worsened over the last 3-4 days. She denies any fevers or pain. She is alert and oriented 3 with a normal neurologic exam. She does note that she is having some mild visual hallucinations of her hair changing colors. 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 We'll get screening labs and imaging.  I was alerted that the patient had a syncopal episode in the bathroom. She does not remember what happened but was found on the ground. She has some mild redness to the left maxillary area and left periorbital area. She has no cervical tenderness to palpation. She does not have any significant face tenderness. She has a mild abrasion to her left upper inner lip but no other obvious signs of trauma.  Will admit to hospitalist.      I personally performed the services described in this documentation, which was scribed in my presence. The recorded information has been reviewed and is accurate.      Pamella Pert, MD 08/29/14 [phone]

## 2014-08-28 NOTE — ED Notes (Signed)
Patient transported to CT 

## 2014-08-28 NOTE — ED Notes (Signed)
Pt sts she has been taking 19 different meds since 07/28/14 and her husband sts she has become confused.

## 2014-08-28 NOTE — ED Notes (Addendum)
MD at bedside.  Cervical collar removed by MD.

## 2014-08-28 NOTE — H&P (Signed)
[facility] History and Physical  Diane [name] GUR:[id number] DOB: Aug 30, 1951 DOA: 08/28/2014  Referring physician: EDP PCP: Antony Blackbird, MD   Chief Complaint: AMS   HPI: Diane [name] is a 63 y.o. female presenting to the ED at [facility] with AMS.  Patient has had agitation, hallucinations, and behavioral changes including not sleeping in the past 3 days.  Sleeping aids have not helped her symptoms.  She has been having some degree of symptoms since being started on prednisone for the past 2 weeks for her Crohn's flare up which started 1 month ago.  Because of symptoms she called her doctor and was switched from 54m prednisone daily to a taper of prednisolone.  She has been on 256mprednisolone for the past 3 days.  [facility] for 4-5 mins.  CT head after that episode was negative.  [facility] my [facility], the patient had a grand-mal seizure.  Review of Systems: Systems reviewed.  As above, otherwise negative  Past Medical History  Diagnosis Date  . History of small bowel obstruction     2011--  [facility] --  RESOLVED -- NO SURGICAL INTERVENTION  . SUI (stress urinary incontinence, female)   . Pelvic prolapse   . Osteoporosis   . History of endometriosis   . PONV (postoperative nausea and vomiting)     AND HARD TO WAKE  . Crohn's disease   . Allergic rhinitis   . Arthritis    Past Surgical History  Procedure Laterality Date  . Laparoscopy/ lysis adhesions/ left salpingoophorectomy/ right ovarian cystectomy  06-08-2000  . Abdominal hysterectomy  1980  . Benign excision right breast cyst  1996  . Exploratory laparotomy  EARLY 1970'S    INFERLITITY  . Pubovaginal sling N/A 06/13/2013    Procedure: PELVIC FLOOR REPAIR  ,  WITH  UPHOLD  , ANTERIOR VAULT WITH KELLY PLICATION WITH LYNX SLING;  Surgeon: SiAilene RudMD;  [facility];   Laterality: N/A;  . Cystoscopy N/A 06/13/2013    Procedure: CYSTOSCOPY;  Surgeon: SiAilene RudMD;  [facility];  Laterality: N/A;  . Anterior and posterior repair  06/13/2013    Procedure:  AUGUEMENTED  RECTOCELE REPAIR WITH XENFORM;  Surgeon: SiAilene RudMD;  [facility];;   Social History:  reports that she has quit smoking. Her smoking use included Cigarettes. She has a 30 pack-year smoking history. She has never used smokeless tobacco. She reports that she drinks about 4.2 ounces of alcohol per week. She reports that she does not use illicit drugs.  Allergies  Allergen Reactions  . Celebrex [Celecoxib] Anaphylaxis  . Actonel [Risedronate Sodium] Other (See Comments)    GI UPSET  . Boniva [Ibandronic Acid] Other (See Comments)    GI UPSET  . Chantix [Varenicline] Other (See Comments)    MOOD CHANGE  . Fosamax [Alendronate Sodium] Other (See Comments)    GI UPSET  . Neosporin [Neomycin-Bacitracin Zn-Polymyx] Swelling and Other (See Comments)    REDDNESS  . Nitrofuran Derivatives Other (See Comments)    CHILLS AND FEVER  . Tobradex [Tobramycin-Dexamethasone] Other (See Comments)    SEVERE EYE IRRIATION    No family history on file.   Prior to Admission medications   Medication Sig Start Date End Date Taking? Authorizing Provider  Calcium  Carb-Cholecalciferol (CALCIUM + D3 PO) Take 2 tablets by mouth daily.   Yes Historical Provider, MD  cetirizine (ZYRTEC) 10 MG tablet Take 10 mg by mouth every evening.    Yes Historical Provider, MD  Cholecalciferol (VITAMIN D3) 1000 UNITS CAPS Take 1 capsule by mouth daily.   Yes Historical Provider, MD  denosumab (PROLIA) 60 MG/ML SOLN injection Inject 60 mg into the skin every 6 (six) months. Administer in upper arm, thigh, or abdomen   Yes Historical Provider, MD  estradiol (VAGIFEM) 25 MCG vaginal tablet Place 25 mcg vaginally 2 (two)  times a week.   Yes Historical Provider, MD  fesoterodine (TOVIAZ) 8 MG TB24 tablet Take 8 mg by mouth at bedtime.    Yes Historical Provider, MD  fluticasone (FLONASE) 50 MCG/ACT nasal spray Place 2 sprays into the nose every morning.    Yes Historical Provider, MD  folic acid (FOLVITE) 1 MG tablet Take 1 mg by mouth daily.   Yes Historical Provider, MD  HYDROcodone-acetaminophen (NORCO/VICODIN) 5-325 MG per tablet Take 1 tablet by mouth every 8 (eight) hours as needed for moderate pain.   Yes Historical Provider, MD  LORazepam (ATIVAN) 1 MG tablet Take 0.5-1 mg by mouth at bedtime as needed for sleep.  08/15/14  Yes Historical Provider, MD  mercaptopurine (PURINETHOL) 50 MG tablet Take 50 mg by mouth daily. Give on an empty stomach 1 hour before or 2 hours after meals. Caution: Chemotherapy.   Yes Historical Provider, MD  mesalamine (APRISO) 0.375 G 24 hr capsule Take 1,500 mg by mouth every morning.   Yes Historical Provider, MD  Polyethyl Glycol-Propyl Glycol (SYSTANE OP) Place 2 drops into both eyes 4 (four) times daily as needed (dry eyes).   Yes Historical Provider, MD  prednisoLONE (ORAPRED ODT) 10 MG disintegrating tablet Take 5-20 mg by mouth daily. Take 2 tablets daily for 3 days, take 1 tablet daily for 3 days, then 1/2 tablet daily for 3 days. 08/25/14  Yes Historical Provider, MD  Probiotic Product (PROBIOTIC DAILY PO) Take 1 tablet by mouth daily. "ULTRAFLORA BRAND"   Yes Historical Provider, MD   Physical Exam: Filed Vitals:   08/28/14 2045  BP: 104/48  Pulse: 90  Temp: 98.4 F (36.9 C)  Resp: 18    BP 104/48  Pulse 90  Temp(Src) 98.4 F (36.9 C) (Oral)  Resp 18  Ht 5' 2"  (1.575 m)  Wt 63.866 kg (140 lb 12.8 oz)  BMI 25.75 kg/m2  SpO2 100%  General Appearance:    Alert, oriented, no distress, appears stated age, appears hypomanic or possibly manic at this point.  Head:    Normocephalic, atraumatic  Eyes:    PERRL, EOMI, sclera non-icteric        Nose:   Nares  without drainage or epistaxis. Mucosa, turbinates normal  Throat:   Moist mucous membranes. Oropharynx without erythema or exudate.  Neck:   Supple. No carotid bruits.  No thyromegaly.  No lymphadenopathy.   Back:     No CVA tenderness, no spinal tenderness  Lungs:     Clear to auscultation bilaterally, without wheezes, rhonchi or rales  Chest wall:    No tenderness to palpitation  Heart:    Regular rate and rhythm without murmurs, gallops, rubs  Abdomen:     Soft, non-tender, nondistended, normal bowel sounds, no organomegaly  Genitalia:    deferred  Rectal:    deferred  Extremities:   No clubbing, cyanosis or edema.  Pulses:   2+  and symmetric all extremities  Skin:   Skin color, texture, turgor normal, no rashes or lesions  Lymph nodes:   Cervical, supraclavicular, and axillary nodes normal  Neurologic:   CNII-XII intact. Normal strength, sensation and reflexes      throughout    Labs on Admission:  Basic Metabolic Panel:  Recent Labs Lab 08/28/14 1630  NA 138  K 3.3*  CL 96  CO2 22  GLUCOSE 110*  BUN 12  CREATININE 0.70  CALCIUM 9.5   Liver Function Tests:  Recent Labs Lab 08/28/14 1630  AST 52*  ALT 64*  ALKPHOS 103  BILITOT 0.5  PROT 7.0  ALBUMIN 3.4*   No results found for this basename: LIPASE, AMYLASE,  in the last 168 hours No results found for this basename: AMMONIA,  in the last 168 hours CBC:  Recent Labs Lab 08/28/14 1630  WBC 9.2  NEUTROABS 6.3  HGB 11.8*  HCT 37.3  MCV 90.3  PLT 353   Cardiac Enzymes:  Recent Labs Lab 08/28/14 1703  TROPONINI <0.30    BNP (last 3 results)  Recent Labs  08/28/14 1630  PROBNP 86.3   CBG:  Recent Labs Lab 08/28/14 1648  GLUCAP 85    Radiological Exams on Admission: Dg Chest 2 View  08/28/2014   CLINICAL DATA:  Altered mental status. Patient has been on multiple medications in the last month and has not slept in 3 days. No complaints of chest pain or shortness of breath.  EXAM: CHEST  2  VIEW  COMPARISON:  04/25/2014.  FINDINGS: Cardiac silhouette is normal in size. Normal mediastinal and hilar contours. Clear lungs. No pleural effusion or pneumothorax.  Bony thorax is intact.  IMPRESSION: No active cardiopulmonary disease.   Electronically Signed   By: Lajean Manes M.D.   On: 08/28/2014 17:19   Ct Head Wo Contrast  08/28/2014   CLINICAL DATA:  Fall, injury, altered mental status, personal history of Crohn's disease  EXAM: CT HEAD WITHOUT CONTRAST  CT CERVICAL SPINE WITHOUT CONTRAST  TECHNIQUE: Multidetector CT imaging of the head and cervical spine was performed following the standard protocol without intravenous contrast. Multiplanar CT image reconstructions of the cervical spine were also generated.  COMPARISON:  None  FINDINGS: CT HEAD FINDINGS  Normal ventricular morphology.  No midline shift or mass effect.  Normal appearance of brain parenchyma.  No intracranial hemorrhage, mass lesion, or evidence acute infarction.  No extra-axial fluid collections.  Bones and sinuses unremarkable.  CT CERVICAL SPINE FINDINGS  Visualized skullbase intact.  Lung apices clear.  Prevertebral soft tissues normal thickness.  Disc space narrowing with endplate spur formation at C4-C5 through C6-C7.  Vertebral body heights maintained without fracture or bone destruction.  Minimal retrolisthesis at C5-C6 and C6-C7.  Multilevel facet degenerative changes of the cervical spine.  Uncovertebral spurs encroach upon the cervical neural foramina bilaterally at C5-C6 and to a lesser degree C6-C7.  Soft tissues unremarkable.  IMPRESSION: No acute intracranial abnormalities.  Multilevel degenerative disc and facet disease changes of the cervical spine as above.  No acute cervical spine abnormalities.   Electronically Signed   By: Lavonia Dana M.D.   On: 08/28/2014 17:18   Ct Cervical Spine Wo Contrast  08/28/2014   CLINICAL DATA:  Fall, injury, altered mental status, personal history of Crohn's disease  EXAM: CT HEAD  WITHOUT CONTRAST  CT CERVICAL SPINE WITHOUT CONTRAST  TECHNIQUE: Multidetector CT imaging of the head and cervical spine was performed following the standard  protocol without intravenous contrast. Multiplanar CT image reconstructions of the cervical spine were also generated.  COMPARISON:  None  FINDINGS: CT HEAD FINDINGS  Normal ventricular morphology.  No midline shift or mass effect.  Normal appearance of brain parenchyma.  No intracranial hemorrhage, mass lesion, or evidence acute infarction.  No extra-axial fluid collections.  Bones and sinuses unremarkable.  CT CERVICAL SPINE FINDINGS  Visualized skullbase intact.  Lung apices clear.  Prevertebral soft tissues normal thickness.  Disc space narrowing with endplate spur formation at C4-C5 through C6-C7.  Vertebral body heights maintained without fracture or bone destruction.  Minimal retrolisthesis at C5-C6 and C6-C7.  Multilevel facet degenerative changes of the cervical spine.  Uncovertebral spurs encroach upon the cervical neural foramina bilaterally at C5-C6 and to a lesser degree C6-C7.  Soft tissues unremarkable.  IMPRESSION: No acute intracranial abnormalities.  Multilevel degenerative disc and facet disease changes of the cervical spine as above.  No acute cervical spine abnormalities.   Electronically Signed   By: Lavonia Dana M.D.   On: 08/28/2014 17:18    EKG: Independently reviewed.  Assessment/Plan Principal Problem:   Steroid-induced psychosis with complication Active Problems:   Syncope   Mania   Grand mal seizure   1. Steroid induced psychosis with complication - patient is very classically manic / hypomanic at this point.  Has not slept in 3 days. 1. Continue to taper patient off of steroids! 2. DDx includes underlying unmasked psychiatric disorder but am fairly sure psych would want [place] to taper her off of steroids first given the known association and potential side effect between steroids and mania / hypomania. 2. Grand mal  seizure - patient had grand mal seizure after my evaluation while I was still on floor, and rapid response was called 1. Neuro called for consult have spoken with Dr. Doy Mince 2. Will see if they want any repeat imaging 3. Probably seizure is related to no sleep for past 3 days 4. Ativan 48m given at rapid response with apparent lysis of seizure, patient now post ictal, movements are purposeful in B extremities now so do not think she has ongoing status. 5. Further Ativan ordered for anxiety 6. Further seizure meds per neuro consult. 7. Transfer to SDU    Code Status: Full Code  Family Communication: Family at bedside Disposition Plan: Admit to SDU   Time spent: 70 min  Joell Buerger M. [facility] Pager [phone] If 7AM-7PM, please contact the day team taking care of the patient [email or web address] Password [email or web address] 08/28/2014, 10:58 PM

## 2014-08-28 NOTE — ED Notes (Addendum)
Pt went to bathroom and was going to collect a urine specimen. Pt had not been having any difficulty standing or walking.  Walking with brisk gait.  After a few minutes nurse asked pt through the bathroom door if she needed any help.  Pt stated No.  After a few minutes another nurse went in the bathroom to check on her and pt was lying on the floor with minimal responsiveness.  C-collar placed on pt.  Pt lifted onto bed with backboard.  Backboard removed from pt once back in room by 2 PA's and an EMT.  Pt does not remember the fall or what happened.  Only visible injury at this time is where pt appears to have bit her upper lip.  Pt does not have any complaints of pain at this time.  Pt is crying when pressed for answers.  Husband at bedside.   Redness noted to left cheek and eyebrow.  Pt denies tenderness on palpation.  Skin intact.

## 2014-08-28 NOTE — ED Notes (Signed)
Pt remains in radiology.

## 2014-08-28 NOTE — Code Documentation (Signed)
  Patient Name: Diane [name]   MRN: [id number]   Date of Birth/ Sex: 1951-01-14 , female      Admission Date: 08/28/2014  Attending Provider: Phillips Grout, MD  Primary Diagnosis: <principal problem not specified>   Indication: Pt was in her usual state of health until this PM, when she was noted to be unresponsive after a seizure. Code blue was subsequently called. At the time of arrival on scene, ACLS protocol was underway.   Technical Description:  - CPR performance duration:  none  - Was defibrillation or cardioversion used? No   - Was external pacer placed? No  - Was patient intubated pre/post CPR? No   Medications Administered: Y = Yes; Blank = No Amiodarone    Atropine    Calcium    Epinephrine    Lidocaine    Magnesium    Norepinephrine    Phenylephrine    Sodium bicarbonate    Vasopressin    Ativan 31m   Post CPR evaluation:  - Final Status - Was patient successfully resuscitated ? Yes - What is current rhythm? Sinus tachycardia - What is current hemodynamic status? stable  Miscellaneous Information:  - Labs sent, including: No  - Primary team notified?  Yes  - Family Notified? Yes  - Additional notes/ transfer status:  Stable with primary team physician present     JJacques Earthly MD  08/28/2014, 10:35 PM

## 2014-08-28 NOTE — ED Notes (Signed)
[facility] has been notified of transfer to room [phone]

## 2014-08-28 NOTE — ED Notes (Signed)
MD at bedside. 

## 2014-08-29 ENCOUNTER — Inpatient Hospital Stay (HOSPITAL_COMMUNITY): Payer: 59

## 2014-08-29 DIAGNOSIS — R569 Unspecified convulsions: Secondary | ICD-10-CM

## 2014-08-29 DIAGNOSIS — E440 Moderate protein-calorie malnutrition: Secondary | ICD-10-CM | POA: Insufficient documentation

## 2014-08-29 DIAGNOSIS — R4182 Altered mental status, unspecified: Secondary | ICD-10-CM

## 2014-08-29 DIAGNOSIS — F309 Manic episode, unspecified: Secondary | ICD-10-CM

## 2014-08-29 LAB — BASIC METABOLIC PANEL
Anion gap: 14 (ref 5–15)
BUN: 7 mg/dL (ref 6–23)
CO2: 23 mEq/L (ref 19–32)
Calcium: 8.5 mg/dL (ref 8.4–10.5)
Chloride: 101 mEq/L (ref 96–112)
Creatinine, Ser: 0.54 mg/dL (ref 0.50–1.10)
Glucose, Bld: 75 mg/dL (ref 70–99)
Potassium: 3.9 mEq/L (ref 3.7–5.3)
SODIUM: 138 meq/L (ref 137–147)

## 2014-08-29 LAB — MRSA PCR SCREENING: MRSA by PCR: NEGATIVE

## 2014-08-29 LAB — URINE CULTURE
Colony Count: NO GROWTH
Culture: NO GROWTH

## 2014-08-29 LAB — AMMONIA: AMMONIA: 42 umol/L (ref 11–60)

## 2014-08-29 LAB — TSH: TSH: 0.761 u[IU]/mL (ref 0.350–4.500)

## 2014-08-29 MED ORDER — BOOST PLUS PO LIQD
237.0000 mL | Freq: Three times a day (TID) | ORAL | Status: DC
  Administered 2014-08-30: 237 mL via ORAL
  Filled 2014-08-29 (×7): qty 237

## 2014-08-29 MED ORDER — GADOBENATE DIMEGLUMINE 529 MG/ML IV SOLN
13.0000 mL | Freq: Once | INTRAVENOUS | Status: AC | PRN
  Administered 2014-08-29: 13 mL via INTRAVENOUS

## 2014-08-29 MED ORDER — POTASSIUM CHLORIDE 10 MEQ/100ML IV SOLN
10.0000 meq | INTRAVENOUS | Status: AC
  Administered 2014-08-29 (×3): 10 meq via INTRAVENOUS
  Filled 2014-08-29 (×3): qty 100

## 2014-08-29 MED FILL — Medication: Qty: 1 | Status: AC

## 2014-08-29 NOTE — Progress Notes (Signed)
EEG completed; results pending.    

## 2014-08-29 NOTE — Procedures (Signed)
History: 63 yo F with suspected seizure  Sedation: Ativan previous night.   Technique: This is a 17 channel routine scalp EEG performed at the bedside with bipolar and monopolar montages arranged in accordance to the international 10/20 system of electrode placement. One channel was dedicated to EKG recording.    Background: There is a marked increase in beta activity in the background giving a sharp appearance to normal structures. There is a posterior dominant rhythm that attenuates with eye opening of 11 HZ. There is sleep recorded as evidenced by sleep spindles. There are positive occipital sharp transientes of sleep(POSTS) which are a benign variant.    Photic stimulation: Physiologic driving is present  EEG Abnormalities: 1) Excessive beta activity.   Clinical Interpretation: This  EEG is recorded in the waking and sleep state. The excessive beta activity is likely due to medication effect and could theoretically lower sensitivity for isolated sharp waves, but no clear epileptiform activity that interrupted the background was seen.   There was no seizure or seizure predisposition recorded on this study. Please note that a normal EEG does not preclude the diagnosis of epilepsy.   Roland Rack, MD [facility] [phone]  If 7pm- 7am, please page neurology on call as listed in [place].

## 2014-08-29 NOTE — Consult Note (Signed)
Reason for Consult:Seizure Referring Physician: Alcario Drought  CC: Seizure  HPI: Diane [name] is an 63 y.o. female presenting to the ED at [facility] with AMS. Patient has had agitation, hallucinations, and behavioral changes including not sleeping in the past 3 days. Sleeping aids have not helped her symptoms. She has been having some degree of symptoms since being started on prednisone for the past 2 weeks for her Crohn's flare up which started 1 month ago. They worsened significantly about 3 days ago.  Because of symptoms she stopped her Prednisone abruptly and took none for about 2 days.  She called her doctor and was given something to take as a substitute.  She has continued to have an altered mental state though.  Fever has not been noted.    Earlier this evening while at [facility] she fell and hit her head while in the bathroom.  Patient was amnestic of the event.  The event was not witnessed.  When found patient had minimal responsiveness.  CT head after that episode was negative.   Past Medical History  Diagnosis Date  . History of small bowel obstruction     2011--  [facility] --  RESOLVED -- NO SURGICAL INTERVENTION  . SUI (stress urinary incontinence, female)   . Pelvic prolapse   . Osteoporosis   . History of endometriosis   . PONV (postoperative nausea and vomiting)     AND HARD TO WAKE  . Crohn's disease   . Allergic rhinitis   . Arthritis     Past Surgical History  Procedure Laterality Date  . Laparoscopy/ lysis adhesions/ left salpingoophorectomy/ right ovarian cystectomy  06-08-2000  . Abdominal hysterectomy  1980  . Benign excision right breast cyst  1996  . Exploratory laparotomy  EARLY 1970'S    INFERLITITY  . Pubovaginal sling N/A 06/13/2013    Procedure: PELVIC FLOOR REPAIR  ,  WITH  UPHOLD  , ANTERIOR VAULT WITH KELLY PLICATION WITH LYNX SLING;  Surgeon: Ailene Rud, MD;  Location: [facility];  Service: Urology;  Laterality: N/A;  . Cystoscopy N/A  06/13/2013    Procedure: CYSTOSCOPY;  Surgeon: Ailene Rud, MD;  Location: [facility];  Service: Urology;  Laterality: N/A;  . Anterior and posterior repair  06/13/2013    Procedure:  AUGUEMENTED  RECTOCELE REPAIR WITH XENFORM;  Surgeon: Ailene Rud, MD;  Location: [facility];  Service: Urology;;    Family history: Patient poorly responsive and unable to obtain  Social History:  reports that she has quit smoking. Her smoking use included Cigarettes. She has a 30 pack-year smoking history. She has never used smokeless tobacco. She reports that she drinks about 4.2 ounces of alcohol per week. She reports that she does not use illicit drugs.  Allergies  Allergen Reactions  . Celebrex [Celecoxib] Anaphylaxis  . Actonel [Risedronate Sodium] Other (See Comments)    GI UPSET  . Boniva [Ibandronic Acid] Other (See Comments)    GI UPSET  . Chantix [Varenicline] Other (See Comments)    MOOD CHANGE  . Fosamax [Alendronate Sodium] Other (See Comments)    GI UPSET  . Neosporin [Neomycin-Bacitracin Zn-Polymyx] Swelling and Other (See Comments)    REDDNESS  . Nitrofuran Derivatives Other (See Comments)    CHILLS AND FEVER  . Tobradex [Tobramycin-Dexamethasone] Other (See Comments)    SEVERE EYE IRRIATION    Medications:  I have reviewed the patient's current medications. Prior to Admission:  Prescriptions prior to admission  Medication Sig Dispense Refill  . Calcium Carb-Cholecalciferol (CALCIUM + D3 PO) Take 2 tablets by mouth daily.      . cetirizine (ZYRTEC) 10 MG tablet Take 10 mg by mouth every evening.       . Cholecalciferol (VITAMIN D3) 1000 UNITS CAPS Take 1 capsule by mouth daily.      Marland Kitchen denosumab (PROLIA) 60 MG/ML SOLN injection Inject 60 mg into the skin every 6 (six) months. Administer in upper arm, thigh, or abdomen      . estradiol (VAGIFEM) 25 MCG vaginal tablet Place 25 mcg vaginally 2 (two) times a week.      . fesoterodine  (TOVIAZ) 8 MG TB24 tablet Take 8 mg by mouth at bedtime.       . fluticasone (FLONASE) 50 MCG/ACT nasal spray Place 2 sprays into the nose every morning.       . folic acid (FOLVITE) 1 MG tablet Take 1 mg by mouth daily.      Marland Kitchen HYDROcodone-acetaminophen (NORCO/VICODIN) 5-325 MG per tablet Take 1 tablet by mouth every 8 (eight) hours as needed for moderate pain.      Marland Kitchen LORazepam (ATIVAN) 1 MG tablet Take 0.5-1 mg by mouth at bedtime as needed for sleep.       Marland Kitchen mercaptopurine (PURINETHOL) 50 MG tablet Take 50 mg by mouth daily. Give on an empty stomach 1 hour before or 2 hours after meals. Caution: Chemotherapy.      . mesalamine (APRISO) 0.375 G 24 hr capsule Take 1,500 mg by mouth every morning.      Vladimir Faster Glycol-Propyl Glycol (SYSTANE OP) Place 2 drops into both eyes 4 (four) times daily as needed (dry eyes).      . prednisoLONE (ORAPRED ODT) 10 MG disintegrating tablet Take 5-20 mg by mouth daily. Take 2 tablets daily for 3 days, take 1 tablet daily for 3 days, then 1/2 tablet daily for 3 days.      . Probiotic Product (PROBIOTIC DAILY PO) Take 1 tablet by mouth daily. "ULTRAFLORA BRAND"       Scheduled: . fesoterodine  8 mg Oral QHS  . heparin  5,000 Units Subcutaneous 3 times per day  . mercaptopurine  50 mg Oral Daily  . mesalamine  1,500 mg Oral Daily  . potassium chloride  10 mEq Intravenous Q1 Hr x 3  . prednisoLONE  10 mg Oral Daily   Followed by  . [START ON 09/01/2014] prednisoLONE  5 mg Oral Daily  . sodium chloride  3 mL Intravenous Q12H    ROS: History obtained from chart review  General ROS: negative for - chills, fatigue, fever, night sweats, weight gain or weight loss Psychological ROS: behavioral disorder, hallucinations Ophthalmic ROS: negative for - blurry vision, double vision, eye pain or loss of vision ENT ROS: negative for - epistaxis, nasal discharge, oral lesions, sore throat, tinnitus or vertigo Allergy and Immunology ROS: negative for - hives or  itchy/watery eyes Hematological and Lymphatic ROS: negative for - bleeding problems, bruising or swollen lymph nodes Endocrine ROS: negative for - galactorrhea, hair pattern changes, polydipsia/polyuria or temperature intolerance Respiratory ROS: negative for - cough, hemoptysis, shortness of breath or wheezing Cardiovascular ROS: leg swelling Gastrointestinal ROS: diarrhea Genito-Urinary ROS: negative for - dysuria, hematuria, incontinence or urinary frequency/urgency Musculoskeletal ROS: negative for - joint swelling or muscular weakness Neurological ROS: as noted in HPI Dermatological ROS: red rash on legs  Physical Examination: Blood pressure 99/47, pulse 85, temperature 97.9 F (36.6 C), temperature  source Oral, resp. rate 13, height 5' 2"  (1.575 m), weight 63.866 kg (140 lb 12.8 oz), SpO2 95.00%.  Neurologic Examination Mental Status: Patient groans with verbal stimuli.  Localizes to pain with deep sternal rub using both upper extremities.  Does not follow commands.  No verbalizations noted other than moaning.  Cranial Nerves: II: patient does not respond confrontation bilaterally, pupils right 3 mm, left 3 mm,and reactive bilaterally III,IV,VI: doll's response absent bilaterally.  V,VII: corneal reflex present bilaterally  VIII: Responds to verbal stimuli IX,X: gag reflex present, XI: trapezius strength unable to test bilaterally XII: tongue strength unable to test Motor: Patient moves all extremities against gravity Sensory: Responds to noxious stimuli in all extremities. Deep Tendon Reflexes:  2+ throughout. Plantars: upgoing bilaterally Cerebellar: Unable to perform    Laboratory Studies:   Basic Metabolic Panel:  Recent Labs Lab 08/28/14 1630 08/28/14 2250  NA 138 139  K 3.3* 3.2*  CL 96 98  CO2 22 19  GLUCOSE 110* 98  BUN 12 8  CREATININE 0.70 0.61  CALCIUM 9.5 8.8    Liver Function Tests:  Recent Labs Lab 08/28/14 1630 08/28/14 2250  AST 52*  43*  ALT 64* 54*  ALKPHOS 103 95  BILITOT 0.5 0.5  PROT 7.0 6.6  ALBUMIN 3.4* 3.2*   No results found for this basename: LIPASE, AMYLASE,  in the last 168 hours No results found for this basename: AMMONIA,  in the last 168 hours  CBC:  Recent Labs Lab 08/28/14 1630 08/28/14 2250  WBC 9.2 7.1  NEUTROABS 6.3  --   HGB 11.8* 11.2*  HCT 37.3 35.0*  MCV 90.3 87.7  PLT 353 290    Cardiac Enzymes:  Recent Labs Lab 08/28/14 1703  TROPONINI <0.30    BNP: No components found with this basename: POCBNP,   CBG:  Recent Labs Lab 08/28/14 1648  GLUCAP 85    Microbiology: No results found for this or any previous visit.  Coagulation Studies: No results found for this basename: LABPROT, INR,  in the last 72 hours  Urinalysis:  Recent Labs Lab 08/28/14 1729  COLORURINE YELLOW  LABSPEC 1.016  PHURINE 5.5  [place]  UROBILINOGEN 0.2  NITRITE NEGATIVE  LEUKOCYTESUR NEGATIVE    Lipid Panel:  No results found for this basename: chol, trig, hdl, cholhdl, vldl, ldlcalc    HgbA1C:  No results found for this basename: HGBA1C    Urine Drug Screen:     Component Value Date/Time   LABOPIA NONE DETECTED 08/28/2014 1729   COCAINSCRNUR NONE DETECTED 08/28/2014 1729   LABBENZ NONE DETECTED 08/28/2014 1729   AMPHETMU NONE DETECTED 08/28/2014 1729   THCU NONE DETECTED 08/28/2014 1729   LABBARB NONE DETECTED 08/28/2014 1729    Alcohol Level:  Recent Labs Lab 08/28/14 [place] <11    Other results: EKG: sinus tachycardia at 103 bpm.  Imaging: Dg Chest 2 View  08/28/2014   CLINICAL DATA:  Altered mental status. Patient has been on multiple medications in the last month and has not slept in 3 days. No complaints of chest pain or shortness of breath.  EXAM: CHEST  2 VIEW  COMPARISON:  04/25/2014.  FINDINGS: Cardiac silhouette is normal in size. Normal mediastinal and hilar  contours. Clear lungs. No pleural effusion or pneumothorax.  Bony thorax is intact.  IMPRESSION: No active cardiopulmonary disease.   Electronically Signed   By: Lajean Manes  M.D.   On: 08/28/2014 17:19   Ct Head Wo Contrast  08/28/2014   CLINICAL DATA:  Fall, injury, altered mental status, personal history of Crohn's disease  EXAM: CT HEAD WITHOUT CONTRAST  CT CERVICAL SPINE WITHOUT CONTRAST  TECHNIQUE: Multidetector CT imaging of the head and cervical spine was performed following the standard protocol without intravenous contrast. Multiplanar CT image reconstructions of the cervical spine were also generated.  COMPARISON:  None  FINDINGS: CT HEAD FINDINGS  Normal ventricular morphology.  No midline shift or mass effect.  Normal appearance of brain parenchyma.  No intracranial hemorrhage, mass lesion, or evidence acute infarction.  No extra-axial fluid collections.  Bones and sinuses unremarkable.  CT CERVICAL SPINE FINDINGS  Visualized skullbase intact.  Lung apices clear.  Prevertebral soft tissues normal thickness.  Disc space narrowing with endplate spur formation at C4-C5 through C6-C7.  Vertebral body heights maintained without fracture or bone destruction.  Minimal retrolisthesis at C5-C6 and C6-C7.  Multilevel facet degenerative changes of the cervical spine.  Uncovertebral spurs encroach upon the cervical neural foramina bilaterally at C5-C6 and to a lesser degree C6-C7.  Soft tissues unremarkable.  IMPRESSION: No acute intracranial abnormalities.  Multilevel degenerative disc and facet disease changes of the cervical spine as above.  No acute cervical spine abnormalities.   Electronically Signed   By: Lavonia Dana M.D.   On: 08/28/2014 17:18   Ct Cervical Spine Wo Contrast  08/28/2014   CLINICAL DATA:  Fall, injury, altered mental status, personal history of Crohn's disease  EXAM: CT HEAD WITHOUT CONTRAST  CT CERVICAL SPINE WITHOUT CONTRAST  TECHNIQUE: Multidetector CT imaging of the head and  cervical spine was performed following the standard protocol without intravenous contrast. Multiplanar CT image reconstructions of the cervical spine were also generated.  COMPARISON:  None  FINDINGS: CT HEAD FINDINGS  Normal ventricular morphology.  No midline shift or mass effect.  Normal appearance of brain parenchyma.  No intracranial hemorrhage, mass lesion, or evidence acute infarction.  No extra-axial fluid collections.  Bones and sinuses unremarkable.  CT CERVICAL SPINE FINDINGS  Visualized skullbase intact.  Lung apices clear.  Prevertebral soft tissues normal thickness.  Disc space narrowing with endplate spur formation at C4-C5 through C6-C7.  Vertebral body heights maintained without fracture or bone destruction.  Minimal retrolisthesis at C5-C6 and C6-C7.  Multilevel facet degenerative changes of the cervical spine.  Uncovertebral spurs encroach upon the cervical neural foramina bilaterally at C5-C6 and to a lesser degree C6-C7.  Soft tissues unremarkable.  IMPRESSION: No acute intracranial abnormalities.  Multilevel degenerative disc and facet disease changes of the cervical spine as above.  No acute cervical spine abnormalities.   Electronically Signed   By: Lavonia Dana M.D.   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 While being evaluated had a witnessed seizure.  Unclear if the patient may have had a seizure at [facility] as well.  Seizure likely provoked.  Patient has had many medication issues recently and has also been sleep deprived.  Would like to rule out other possibilities as well.    Recommendations: 1.  Seizure precautions 2.  EEG 3.  Would not start anticonvulsant therapy at this time.  Ativan prn.   4.  MR of the brain with and without contrast.   5.  Serum ammonia, TSH  Alexis Goodell, MD [facility] [phone] 08/29/2014, 12:37 AM

## 2014-08-29 NOTE — Progress Notes (Signed)
INITIAL NUTRITION ASSESSMENT  DOCUMENTATION CODES Per approved criteria  -Non-severe (moderate) malnutrition in the context of acute illness or injury   Pt meets criteria for non-severe (moderate) MALNUTRITION in the context of acute illness as evidenced by 7% weight loss within one month and mild depletion of muscle mass.  INTERVENTION:  Boost Plus PO TID, each supplement provides 360 kcals, 14 gm protein.  NUTRITION DIAGNOSIS: Malnutrition related to inadequate oral intake as evidenced by 7% weight loss within one month and mild depletion of muscle mass.   Goal: Intake to meet >90% of estimated nutrition needs.  Monitor:  PO intake, labs, weight trend.  Reason for Assessment: MST  63 y.o. female  Admitting Dx: Steroid-induced psychosis with complication  ASSESSMENT: Patient presented to the ED at [facility] with AMS. Patient has had agitation, hallucinations, and behavioral changes including not sleeping in the past 3 days. Sleeping aids have not helped her symptoms. She has been having some degree of symptoms since being started on prednisone for the past 2 weeks for her Crohn's flare up which started 1 month ago.   Spoke with patient and her family. She has been eating poorly for the past few weeks since starting steroids for Crohn's attack. She has been avoiding fried chicken in her diet. She reports that she lost 10 lbs in one week.  Nutrition Focused Physical Exam:  Subcutaneous Fat:  Orbital Region: WNL Upper Arm Region: WNL Thoracic and Lumbar Region: WNL  Muscle:  Temple Region: WNL Clavicle Bone Region: mild depletion Clavicle and Acromion Bone Region: mild depletion Scapular Bone Region: WNL Dorsal Hand: WNL Patellar Region: mild depletion Anterior Thigh Region: WNL Posterior Calf Region: WNL  Edema: none   Height: Ht Readings from Last 1 Encounters:  08/28/14 5' 2"  (1.575 m)    Weight: Wt Readings from Last 1 Encounters:  08/28/14 140 lb 12.8 oz  (63.866 kg)    Ideal Body Weight: 50 kg  % Ideal Body Weight: 128%  Wt Readings from Last 10 Encounters:  08/28/14 140 lb 12.8 oz (63.866 kg)  07/28/14 150 lb (68.04 kg)  06/13/13 148 lb (67.132 kg)  06/13/13 148 lb (67.132 kg)  11/01/12 146 lb (66.225 kg)    Usual Body Weight: 150 lb  % Usual Body Weight: 93%  BMI:  Body mass index is 25.75 kg/(m^2).  Estimated Nutritional Needs: Kcal: 1600-1800 Protein: 80-95 gm Fluid: 1.8 L  Skin: wound on back  Diet Order: Criss Rosales  EDUCATION NEEDS: -Education needs addressed   Intake/Output Summary (Last 24 hours) at 08/29/14 1543 Last data filed at 08/29/14 1300  Gross per 24 hour  Intake    300 ml  Output   1250 ml  Net   -950 ml    Last BM: 10/26   Labs:   Recent Labs Lab 08/28/14 1630 08/28/14 2250 08/29/14 0235  NA 138 139 138  K 3.3* 3.2* 3.9  CL 96 98 101  CO2 22 19 23   BUN 12 8 7   CREATININE 0.70 0.61 0.54  CALCIUM 9.5 8.8 8.5  GLUCOSE 110* 98 75    CBG (last 3)   Recent Labs  08/28/14 1648  GLUCAP 85    Scheduled Meds: . fesoterodine  8 mg Oral QHS  . heparin  5,000 Units Subcutaneous 3 times per day  . mercaptopurine  50 mg Oral Daily  . mesalamine  1,500 mg Oral Daily  . prednisoLONE  10 mg Oral Daily   Followed by  . [START ON 09/01/2014]  prednisoLONE  5 mg Oral Daily  . sodium chloride  3 mL Intravenous Q12H    Continuous Infusions:   Past Medical History  Diagnosis Date  . History of small bowel obstruction     2011--  [facility] --  RESOLVED -- NO SURGICAL INTERVENTION  . SUI (stress urinary incontinence, female)   . Pelvic prolapse   . Osteoporosis   . History of endometriosis   . PONV (postoperative nausea and vomiting)     AND HARD TO WAKE  . Crohn's disease   . Allergic rhinitis   . Arthritis     Past Surgical History  Procedure Laterality Date  . Laparoscopy/ lysis adhesions/ left salpingoophorectomy/ right ovarian cystectomy  06-08-2000  . Abdominal hysterectomy   1980  . Benign excision right breast cyst  1996  . Exploratory laparotomy  EARLY 1970'S    INFERLITITY  . Pubovaginal sling N/A 06/13/2013    Procedure: PELVIC FLOOR REPAIR  ,  WITH  UPHOLD  , ANTERIOR VAULT WITH KELLY PLICATION WITH LYNX SLING;  Surgeon: Ailene Rud, MD;  Location: [facility];  Service: Urology;  Laterality: N/A;  . Cystoscopy N/A 06/13/2013    Procedure: CYSTOSCOPY;  Surgeon: Ailene Rud, MD;  Location: [facility];  Service: Urology;  Laterality: N/A;  . Anterior and posterior repair  06/13/2013    Procedure:  AUGUEMENTED  RECTOCELE REPAIR WITH XENFORM;  Surgeon: Ailene Rud, MD;  Location: [facility];  Service: Urology;;    Molli Barrows, [place], [place], [place] Pager [phone] After Hours Pager [phone]

## 2014-08-29 NOTE — Progress Notes (Signed)
Utilization review completed.  

## 2014-08-29 NOTE — Progress Notes (Signed)
Patient Demographics  Diane [name], is a 63 y.o. female, DOB - Aug 15, 1951, IRS:[id number]  Admit date - 08/28/2014   Admitting Physician Phillips Grout, MD  Outpatient Primary MD for the patient is FULP, CAMMIE, MD  LOS - 1   Chief Complaint  Patient presents with  . Altered Mental Status      Brief narrative: Diane [name] is a 63 y.o. female presenting to the ED at [facility] with AMS. Patient has had agitation, hallucinations, and behavioral changes including not sleeping in the past 3 days. Sleeping aids have not helped her symptoms. She has been having some degree of symptoms since being started on prednisone for the past 2 weeks for her Crohn's flare up which started 1 month ago. Because of symptoms she called her doctor and was switched from 85m prednisone daily to a taper of prednisolone. She has been on 274mprednisolone for the past 3 days.  [facility] for 4-5 mins. CT head after that episode was negative.  [facility], the patient had a grand-mal seizure.   Subjective:   BaAilyn [name] has, No headache, No chest pain, No abdominal pain - No Nausea, No new weakness tingling or numbness, No Cough - SOB.   Assessment & Plan    Principal Problem:   Steroid-induced psychosis with complication Active Problems:   Syncope   Mania   Grand mal seizure  Acute encephalopathy -Currently patient much improved, most likely related to steroids induced psychosis with complication, as well polypharmacy. -Proceed with steroid taper.  Seizures -Neurology consult appreciated. -Follow on MRI brain, EEG  History of Crohn's disease  -Continue with the steroid taper. -Continue with mesalamine    Code Status: Full  Family Communication: Daughter at bedside  10/27  Disposition Plan: Continue to monitor and stepdown   Procedures  None  Consults   Neurology   Medications  Scheduled Meds: . fesoterodine  8 mg Oral QHS  . heparin  5,000 Units Subcutaneous 3 times per day  . mercaptopurine  50 mg Oral Daily  . mesalamine  1,500 mg Oral Daily  . prednisoLONE  10 mg Oral Daily   Followed by  . [START ON 09/01/2014] prednisoLONE  5 mg Oral Daily  . sodium chloride  3 mL Intravenous Q12H   Continuous Infusions:  PRN Meds:.HYDROcodone-acetaminophen, LORazepam  DVT ProphylaxisHeparin - SCDs   Lab Results  Component Value Date   PLT 290 08/28/2014    Antibiotics   Anti-infectives   None          Objective:   Filed Vitals:   08/29/14 0300 08/29/14 0700 08/29/14 0806 08/29/14 1141  BP: 131/68  98/59   Pulse: 89  85   Temp: 98.4 F (36.9 C) 97.5 F (36.4 C)  98.8 F (37.1 C)  TempSrc: Oral Axillary  Oral  Resp: 21  27   Height:      Weight:      SpO2: 100%  100%     Wt Readings from Last 3 Encounters:  08/28/14 63.866 kg (140 lb 12.8 oz)  07/28/14 68.04 kg (150 lb)  06/13/13 67.132 kg (148 lb)  Intake/Output Summary (Last 24 hours) at 08/29/14 1155 Last data filed at 08/29/14 0318  Gross per 24 hour  Intake    300 ml  Output      0 ml  Net    300 ml     Physical Exam  Awake Alert, Oriented X 3, No new F.N deficits, sleepy Moran.AT,PERRAL Supple Neck,No JVD, No cervical lymphadenopathy appriciated.  Symmetrical Chest wall movement, Good air movement bilaterally, CTAB RRR,No Gallops,Rubs or new Murmurs, No Parasternal Heave +ve B.Sounds, Abd Soft, No tenderness, No organomegaly appriciated, No rebound - guarding or rigidity. No Cyanosis, Clubbing or edema, No new Rash or bruise    Data Review   Micro Results Recent Results (from the past 240 hour(s))  MRSA PCR SCREENING     Status: None   Collection Time    08/29/14 12:38 AM      Result Value Ref Range Status   MRSA by PCR NEGATIVE  NEGATIVE  Final   Comment:            The GeneXpert MRSA Assay (FDA     approved for NASAL specimens     only), is one component of a     comprehensive MRSA colonization     surveillance program. It is not     intended to diagnose MRSA     infection nor to guide or     monitor treatment for     MRSA infections.    Radiology Reports Dg Chest 2 View  08/28/2014   CLINICAL DATA:  Altered mental status. Patient has been on multiple medications in the last month and has not slept in 3 days. No complaints of chest pain or shortness of breath.  EXAM: CHEST  2 VIEW  COMPARISON:  04/25/2014.  FINDINGS: Cardiac silhouette is normal in size. Normal mediastinal and hilar contours. Clear lungs. No pleural effusion or pneumothorax.  Bony thorax is intact.  IMPRESSION: No active cardiopulmonary disease.   Electronically Signed   By: Lajean Manes M.D.   On: 08/28/2014 17:19   Ct Head Wo Contrast  08/28/2014   CLINICAL DATA:  Fall, injury, altered mental status, personal history of Crohn's disease  EXAM: CT HEAD WITHOUT CONTRAST  CT CERVICAL SPINE WITHOUT CONTRAST  TECHNIQUE: Multidetector CT imaging of the head and cervical spine was performed following the standard protocol without intravenous contrast. Multiplanar CT image reconstructions of the cervical spine were also generated.  COMPARISON:  None  FINDINGS: CT HEAD FINDINGS  Normal ventricular morphology.  No midline shift or mass effect.  Normal appearance of brain parenchyma.  No intracranial hemorrhage, mass lesion, or evidence acute infarction.  No extra-axial fluid collections.  Bones and sinuses unremarkable.  CT CERVICAL SPINE FINDINGS  Visualized skullbase intact.  Lung apices clear.  Prevertebral soft tissues normal thickness.  Disc space narrowing with endplate spur formation at C4-C5 through C6-C7.  Vertebral body heights maintained without fracture or bone destruction.  Minimal retrolisthesis at C5-C6 and C6-C7.  Multilevel facet degenerative changes of  the cervical spine.  Uncovertebral spurs encroach upon the cervical neural foramina bilaterally at C5-C6 and to a lesser degree C6-C7.  Soft tissues unremarkable.  IMPRESSION: No acute intracranial abnormalities.  Multilevel degenerative disc and facet disease changes of the cervical spine as above.  No acute cervical spine abnormalities.   Electronically Signed   By: Lavonia Dana M.D.   On: 08/28/2014 17:18   Ct Cervical Spine Wo Contrast  08/28/2014   CLINICAL DATA:  Fall,  injury, altered mental status, personal history of Crohn's disease  EXAM: CT HEAD WITHOUT CONTRAST  CT CERVICAL SPINE WITHOUT CONTRAST  TECHNIQUE: Multidetector CT imaging of the head and cervical spine was performed following the standard protocol without intravenous contrast. Multiplanar CT image reconstructions of the cervical spine were also generated.  COMPARISON:  None  FINDINGS: CT HEAD FINDINGS  Normal ventricular morphology.  No midline shift or mass effect.  Normal appearance of brain parenchyma.  No intracranial hemorrhage, mass lesion, or evidence acute infarction.  No extra-axial fluid collections.  Bones and sinuses unremarkable.  CT CERVICAL SPINE FINDINGS  Visualized skullbase intact.  Lung apices clear.  Prevertebral soft tissues normal thickness.  Disc space narrowing with endplate spur formation at C4-C5 through C6-C7.  Vertebral body heights maintained without fracture or bone destruction.  Minimal retrolisthesis at C5-C6 and C6-C7.  Multilevel facet degenerative changes of the cervical spine.  Uncovertebral spurs encroach upon the cervical neural foramina bilaterally at C5-C6 and to a lesser degree C6-C7.  Soft tissues unremarkable.  IMPRESSION: No acute intracranial abnormalities.  Multilevel degenerative disc and facet disease changes of the cervical spine as above.  No acute cervical spine abnormalities.   Electronically Signed   By: Lavonia Dana M.D.   On: 08/28/2014 17:18    CBC  Recent Labs Lab 08/28/14 1630  08/28/14 2250  WBC 9.2 7.1  HGB 11.8* 11.2*  HCT 37.3 35.0*  PLT 353 290  MCV 90.3 87.7  MCH 28.6 28.1  MCHC 31.6 32.0  RDW 15.9* 15.8*  LYMPHSABS 2.1  --   MONOABS 0.6  --   EOSABS 0.2  --   BASOSABS 0.0  --     Chemistries   Recent Labs Lab 08/28/14 1630 08/28/14 2250 08/29/14 0235  NA 138 139 138  K 3.3* 3.2* 3.9  CL 96 98 101  CO2 22 19 23   GLUCOSE 110* 98 75  BUN 12 8 7   CREATININE 0.70 0.61 0.54  CALCIUM 9.5 8.8 8.5  AST 52* 43*  --   ALT 64* 54*  --   ALKPHOS 103 95  --   BILITOT 0.5 0.5  --    ------------------------------------------------------------------------------------------------------------------ estimated creatinine clearance is 62.4 ml/min (by C-G formula based on Cr of 0.54). ------------------------------------------------------------------------------------------------------------------ No results found for this basename: HGBA1C,  in the last 72 hours ------------------------------------------------------------------------------------------------------------------ No results found for this basename: CHOL, HDL, LDLCALC, TRIG, CHOLHDL, LDLDIRECT,  in the last 72 hours ------------------------------------------------------------------------------------------------------------------  Recent Labs  08/29/14 0335  TSH 0.761   ------------------------------------------------------------------------------------------------------------------ No results found for this basename: VITAMINB12, FOLATE, FERRITIN, TIBC, IRON, RETICCTPCT,  in the last 72 hours  Coagulation profile No results found for this basename: INR, PROTIME,  in the last 168 hours  No results found for this basename: DDIMER,  in the last 72 hours  Cardiac Enzymes  Recent Labs Lab 08/28/14 1703  TROPONINI <0.30   ------------------------------------------------------------------------------------------------------------------ No components found with this basename: POCBNP,       Time Spent in minutes   30 minutes   Maitlyn Penza M.D on 08/29/2014 at 11:55 AM  Between 7am to 7pm - Pager - [phone]  After 7pm go to [email or web address] - password [email or web address]  And look for the night coverage person covering for me after hours  [facility] Office  [phone]   **Disclaimer: This note may have been dictated with voice recognition software. Similar sounding words can inadvertently be transcribed and this note may contain transcription errors which may not have been corrected  upon publication of note.**

## 2014-08-29 NOTE — Progress Notes (Signed)
Called into the room and found the patient being assisted by fellow RN; she had already begun having a seizure.  She had been placed on her side by RN.  Code was called.  Patient unresponsive, maintained a pulse, was turned on her back and airway maintained, patient was bagged.  No intubation, maintaining gag reflex and 100% O2 sats on 4L. Ativan 75m IV given.  Patient is being transferred to step-down per Dr. GAlcario Drought  Neuro was consulted.  MDallie Piles LWynetta Emery RN

## 2014-08-30 DIAGNOSIS — G40409 Other generalized epilepsy and epileptic syndromes, not intractable, without status epilepticus: Secondary | ICD-10-CM

## 2014-08-30 DIAGNOSIS — F19951 Other psychoactive substance use, unspecified with psychoactive substance-induced psychotic disorder with hallucinations: Secondary | ICD-10-CM

## 2014-08-30 LAB — BASIC METABOLIC PANEL
Anion gap: 12 (ref 5–15)
BUN: 8 mg/dL (ref 6–23)
CALCIUM: 8.7 mg/dL (ref 8.4–10.5)
CO2: 25 mEq/L (ref 19–32)
Chloride: 104 mEq/L (ref 96–112)
Creatinine, Ser: 0.55 mg/dL (ref 0.50–1.10)
GFR calc Af Amer: >90 mL/min (ref >90)
Glucose, Bld: 100 mg/dL — ABNORMAL HIGH (ref 70–99)
Potassium: 3.6 mEq/L — ABNORMAL LOW (ref 3.7–5.3)
SODIUM: 141 meq/L (ref 137–147)

## 2014-08-30 LAB — CBC
HCT: 32.7 % — ABNORMAL LOW (ref 36.0–46.0)
HEMOGLOBIN: 10.4 g/dL — AB (ref 12.0–15.0)
MCH: 28.1 pg (ref 26.0–34.0)
MCHC: 31.8 g/dL (ref 30.0–36.0)
MCV: 88.4 fL (ref 78.0–100.0)
Platelets: 254 10*3/uL (ref 150–400)
RBC: 3.7 MIL/uL — ABNORMAL LOW (ref 3.87–5.11)
RDW: 15.8 % — AB (ref 11.5–15.5)
WBC: 7.1 10*3/uL (ref 4.0–10.5)

## 2014-08-30 MED ORDER — CETIRIZINE HCL 10 MG PO TABS: 10.0000 mg | ORAL_TABLET | Freq: Every day | ORAL | Status: DC | PRN

## 2014-08-30 MED ORDER — PREDNISONE 5 MG PO TABS: 5.0000 mg | ORAL_TABLET | Freq: Every day | ORAL | Status: DC

## 2014-08-30 MED ORDER — FLUTICASONE PROPIONATE 50 MCG/ACT NA SUSP: 2.0000 | Freq: Every day | NASAL | Status: DC | PRN

## 2014-08-30 NOTE — Evaluation (Signed)
Alben Deeds, [place] DPT  [phone]

## 2014-08-30 NOTE — Discharge Instructions (Signed)
Follow with Primary MD FULP, CAMMIE, MD in 5 days   Get CBC, CMP, 2 view Chest X ray checked  by Primary MD next visit.    Activity: As tolerated with Full fall precautions use walker/cane & assistance as needed   Disposition Home    Diet: Heart Healthy  , with feeding assistance and aspiration precautions as needed.  For Heart failure patients - Check your Weight same time everyday, if you gain over 2 pounds, or you develop in leg swelling, experience more shortness of breath or chest pain, call your Primary MD immediately. Follow Cardiac Low Salt Diet and 1.8 lit/day fluid restriction.   On your next visit with your primary care physician please Get Medicines reviewed and adjusted.   Please request your Prim.MD to go over all Hospital Tests and Procedure/Radiological results at the follow up, please get all Hospital records sent to your Prim MD by signing hospital release before you go home.   If you experience worsening of your admission symptoms, develop shortness of breath, life threatening emergency, suicidal or homicidal thoughts you must seek medical attention immediately by calling 911 or calling your MD immediately  if symptoms less severe.  You Must read complete instructions/literature along with all the possible adverse reactions/side effects for all the Medicines you take and that have been prescribed to you. Take any new Medicines after you have completely understood and accpet all the possible adverse reactions/side effects.   Do not drive, operating heavy machinery, perform activities at heights, swimming or participation in water activities or provide baby sitting services if your were admitted for syncope or siezures until you have seen by Primary MD or a Neurologist and advised to do so again.  Do not drive when taking Pain medications.    Do not take more than prescribed Pain, Sleep and Anxiety Medications  Special Instructions: If you have smoked or chewed  Tobacco  in the last 2 yrs please stop smoking, stop any regular Alcohol  and or any Recreational drug use.  Wear Seat belts while driving.   Please note  You were cared for by a hospitalist during your hospital stay. If you have any questions about your discharge medications or the care you received while you were in the hospital after you are discharged, you can call the unit and asked to speak with the hospitalist on call if the hospitalist that took care of you is not available. Once you are discharged, your primary care physician will handle any further medical issues. Please note that NO REFILLS for any discharge medications will be authorized once you are discharged, as it is imperative that you return to your primary care physician (or establish a relationship with a primary care physician if you do not have one) for your aftercare needs so that they can reassess your need for medications and monitor your lab values.

## 2014-08-30 NOTE — Progress Notes (Signed)
Patient discharge instructions reviewed. Prescription given to patient. Patient and husband verbalize understanding. Patient noted to be in no acute distress,  vital signs stable. Will be discharged via wheelchair to their home.   Radonna Ricker, RN

## 2014-08-30 NOTE — Discharge Summary (Addendum)
Diane [name], [place] y.o., DOB 11-Nov-1950, MRN [id number]. Admission date: 08/28/2014 Discharge Date 08/30/2014 Primary MD Antony Blackbird, MD Admitting Physician Phillips Grout, MD  Admission Diagnosis  Altered mental status [R41.82] Fall [W19.XXXA] Syncope, unspecified syncope type [R55]  Discharge Diagnosis   Principal Problem:   Steroid-induced psychosis with complication Active Problems:   Syncope   Mania   Grand mal seizure   Malnutrition of moderate degree      Past Medical History  Diagnosis Date  . History of small bowel obstruction     2011--  PARITAL --  RESOLVED -- NO SURGICAL INTERVENTION  . SUI (stress urinary incontinence, female)   . Pelvic prolapse   . Osteoporosis   . History of endometriosis   . PONV (postoperative nausea and vomiting)     AND HARD TO WAKE  . Crohn's disease   . Allergic rhinitis   . Arthritis     Past Surgical History  Procedure Laterality Date  . Laparoscopy/ lysis adhesions/ left salpingoophorectomy/ right ovarian cystectomy  06-08-2000  . Abdominal hysterectomy  1980  . Benign excision right breast cyst  1996  . Exploratory laparotomy  EARLY 1970'S    INFERLITITY  . Pubovaginal sling N/A 06/13/2013    Procedure: PELVIC FLOOR REPAIR  ,  WITH  UPHOLD  , ANTERIOR VAULT WITH KELLY PLICATION WITH LYNX SLING;  Surgeon: Ailene Rud, MD;  Location: [facility];  Service: Urology;  Laterality: N/A;  . Cystoscopy N/A 06/13/2013    Procedure: CYSTOSCOPY;  Surgeon: Ailene Rud, MD;  Location: [facility];  Service: Urology;  Laterality: N/A;  . Anterior and posterior repair  06/13/2013    Procedure:  AUGUEMENTED  RECTOCELE REPAIR WITH XENFORM;  Surgeon: Ailene Rud, MD;  Location: [facility];  Service: Urology;;   Brief narrative:  Diane [name] is a 63 y.o. female presenting to the ED at [facility] with AMS. Patient has had agitation, hallucinations, and behavioral changes including  not sleeping in the past 3 days. Sleeping aids have not helped her symptoms. She has been having some degree of symptoms since being started on prednisone for the past 2 weeks for her Crohn's flare up which started 1 month ago. Because of symptoms she called her doctor and was switched from 93m prednisone daily to a taper of prednisolone. She has been on 233mprednisolone for the past 3 days.  [facility] head with LOC for 4-5 mins. CT head after that episode was negative.  [facility] my evaluation, the patient had a grand-mal seizure. Patient was admitted with altered mental status, it was felt most likely due to polypharmacy, and prednisone hallucinations/psychosis , and urology were consulted, and they did not recommend any new medication for seizures asSeizure likely provoked. As she had many medication issues recently and has also been sleep deprived. Returned to baseline mental status at this time. MRI and EEG overall unremarkable. Exam non-focal.       Hospital Course See H&P, Labs, Consult and Test reports for all details in brief, patient was admitted for   Principal Problem:   Steroid-induced psychosis with complication Active Problems:   Syncope   Mania   Grand mal seizure   Malnutrition of moderate degree  Acute encephalopathy  -Resolved, back to baseline most likely related to steroids induced psychosis with complication, as well polypharmacy.  -Proceed with steroid taper.  We will continue 3 days of oral 5 mg prednisone as an outpatient then DC. -Patient medication where reconciled upon discharge.  Seizures  -Neurology consult appreciated.  - MRI brain, EEG overall unremarkable --no further neurological workup at this time  -would hold on starting an antiepileptic as seizure episode likely provoked  -follow up with outpatient neurology upon discharge    History of Crohn's disease  -Continue with the  steroid taper.  -Continue with mesalamine and mercaptopurine.   Consults   Neurology   Significant Tests:  See full reports for all details    Dg Chest 2 View  08/28/2014   CLINICAL DATA:  Altered mental status. Patient has been on multiple medications in the last month and has not slept in 3 days. No complaints of chest pain or shortness of breath.  EXAM: CHEST  2 VIEW  COMPARISON:  04/25/2014.  FINDINGS: Cardiac silhouette is normal in size. Normal mediastinal and hilar contours. Clear lungs. No pleural effusion or pneumothorax.  Bony thorax is intact.  IMPRESSION: No active cardiopulmonary disease.   Electronically Signed   By: Lajean Manes M.D.   On: 08/28/2014 17:19   Ct Head Wo Contrast  08/28/2014   CLINICAL DATA:  Fall, injury, altered mental status, personal history of Crohn's disease  EXAM: CT HEAD WITHOUT CONTRAST  CT CERVICAL SPINE WITHOUT CONTRAST  TECHNIQUE: Multidetector CT imaging of the head and cervical spine was performed following the standard protocol without intravenous contrast. Multiplanar CT image reconstructions of the cervical spine were also generated.  COMPARISON:  None  FINDINGS: CT HEAD FINDINGS  Normal ventricular morphology.  No midline shift or mass effect.  Normal appearance of brain parenchyma.  No intracranial hemorrhage, mass lesion, or evidence acute infarction.  No extra-axial fluid collections.  Bones and sinuses unremarkable.  CT CERVICAL SPINE FINDINGS  Visualized skullbase intact.  Lung apices clear.  Prevertebral soft tissues normal thickness.  Disc space narrowing with endplate spur formation at C4-C5 through C6-C7.  Vertebral body heights maintained without fracture or bone destruction.  Minimal retrolisthesis at C5-C6 and C6-C7.  Multilevel facet degenerative changes of the cervical spine.  Uncovertebral spurs encroach upon the cervical neural foramina bilaterally at C5-C6 and to a lesser degree C6-C7.  Soft tissues unremarkable.  IMPRESSION: No acute  intracranial abnormalities.  Multilevel degenerative disc and facet disease changes of the cervical spine as above.  No acute cervical spine abnormalities.   Electronically Signed   By: Lavonia Dana M.D.   On: 08/28/2014 17:18   Ct Cervical Spine Wo Contrast  08/28/2014   CLINICAL DATA:  Fall, injury, altered mental status, personal history of Crohn's disease  EXAM: CT HEAD WITHOUT CONTRAST  CT CERVICAL SPINE WITHOUT CONTRAST  TECHNIQUE: Multidetector CT imaging of the head and cervical spine was performed following the standard protocol without intravenous contrast. Multiplanar CT image reconstructions of the cervical spine were also generated.  COMPARISON:  None  FINDINGS: CT HEAD FINDINGS  Normal ventricular morphology.  No midline shift or mass effect.  Normal appearance of brain parenchyma.  No intracranial hemorrhage, mass lesion, or evidence acute infarction.  No extra-axial fluid collections.  Bones and sinuses unremarkable.  CT CERVICAL SPINE FINDINGS  Visualized skullbase intact.  Lung apices clear.  Prevertebral soft tissues normal thickness.  Disc space narrowing with endplate spur formation at C4-C5 through C6-C7.  Vertebral body heights maintained without fracture or bone destruction.  Minimal retrolisthesis at C5-C6 and C6-C7.  Multilevel facet degenerative changes of the  cervical spine.  Uncovertebral spurs encroach upon the cervical neural foramina bilaterally at C5-C6 and to a lesser degree C6-C7.  Soft tissues unremarkable.  IMPRESSION: No acute intracranial abnormalities.  Multilevel degenerative disc and facet disease changes of the cervical spine as above.  No acute cervical spine abnormalities.   Electronically Signed   By: Lavonia Dana M.D.   On: 08/28/2014 17:18   Mr Jeri Cos UQ Contrast  08/29/2014   CLINICAL DATA:  New onset seizure. Altered mental status, agitation, hallucinations, and behavioral changes for the past 3 days. Recent fall with head injury and loss of consciousness  followed by a grand mal seizure.  EXAM: MRI HEAD WITHOUT AND WITH CONTRAST  TECHNIQUE: Multiplanar, multiecho pulse sequences of the brain and surrounding structures were obtained without and with intravenous contrast.  CONTRAST:  34m MULTIHANCE GADOBENATE DIMEGLUMINE 529 MG/ML IV SOLN  COMPARISON:  Head CT 08/28/2014  FINDINGS: Dedicated thin section imaging through the temporal lobes demonstrates normal volume and signal of the hippocampi. There is no evidence of heterotopia.  There is no evidence of acute infarct, intracranial hemorrhage, mass, midline shift, or extra-axial fluid collection. Ventricles and sulci are within normal limits for age. There are a few small foci of T2 hyperintensity in the periventricular white matter bilaterally. There is no abnormal enhancement.  Orbits are unremarkable. Paranasal sinuses and mastoid air cells are clear. Major intracranial vascular flow voids are preserved.  IMPRESSION: 1. No acute intracranial abnormality, mass, or etiology of seizures identified. 2. Small foci of periventricular white matter T2 signal abnormality, nonspecific but may reflect minimal chronic small vessel ischemic disease.   Electronically Signed   By: ALogan Bores  On: 08/29/2014 20:18     Today   Subjective:   BHayzlee Mcsorleytoday has no headache,no chest abdominal pain,no new weakness tingling or numbness, feels much better wants to go home today.   Objective:   Blood pressure 106/61, pulse 87, temperature 97.8 F (36.6 C), temperature source Oral, resp. rate 21, height 5' 2"  (1.575 m), weight 63.866 kg (140 lb 12.8 oz), SpO2 98.00%.  Intake/Output Summary (Last 24 hours) at 08/30/14 1316 Last data filed at 08/30/14 1000  Gross per 24 hour  Intake    240 ml  Output    850 ml  Net   -610 ml    Exam Awake Alert, Oriented *3, No new F.N deficits, Normal affect [place].AT,PERRAL Supple Neck,No JVD, No cervical lymphadenopathy appriciated.  Symmetrical Chest wall movement, Good air  movement bilaterally, CTAB RRR,No Gallops,Rubs or new Murmurs, No Parasternal Heave +ve B.Sounds, Abd Soft, Non tender, No organomegaly appriciated, No rebound -guarding or rigidity. No Cyanosis, Clubbing or edema, No new Rash or bruise  Data Review    CBC w Diff: Lab Results  Component Value Date   WBC 7.1 08/30/2014   HGB 10.4* 08/30/2014   HCT 32.7* 08/30/2014   PLT 254 08/30/2014   LYMPHOPCT 22 08/28/2014   MONOPCT 7 08/28/2014   EOSPCT 2 08/28/2014   BASOPCT 0 08/28/2014   CMP: Lab Results  Component Value Date   NA 141 08/30/2014   K 3.6* 08/30/2014   CL 104 08/30/2014   CO2 25 08/30/2014   BUN 8 08/30/2014   CREATININE 0.55 08/30/2014   PROT 6.6 08/28/2014   ALBUMIN 3.2* 08/28/2014   BILITOT 0.5 08/28/2014   ALKPHOS 95 08/28/2014   AST 43* 08/28/2014   ALT 54* 08/28/2014  .  Micro Results Recent Results (from the past 240  hour(s))  URINE CULTURE     Status: None   Collection Time    08/28/14  5:29 PM      Result Value Ref Range Status   Specimen Description URINE, CATHETERIZED   Final   Special Requests NONE   Final   Culture  Setup Time     Final   Value: 08/28/2014 21:05     Performed at [facility]     Final   Value: NO GROWTH     Performed at [facility]   Culture     Final   Value: NO GROWTH     Performed at [facility]   Report Status 08/29/2014 FINAL   Final  MRSA PCR SCREENING     Status: None   Collection Time    08/29/14 12:38 AM      Result Value Ref Range Status   MRSA by PCR NEGATIVE  NEGATIVE Final   Comment:            The GeneXpert MRSA Assay (FDA     approved for NASAL specimens     only), is one component of a     comprehensive MRSA colonization     surveillance program. It is not     intended to diagnose MRSA     infection nor to guide or     monitor treatment for     MRSA infections.     Discharge Instructions     Follow up with neurology as an outpatient.      Follow-up  Information   Follow up with FULP, CAMMIE, MD. Schedule an appointment as soon as possible for a visit in 1 week.   Specialty:  Family Medicine   Contact information:   [address] [place] [id number] [phone]       Follow up with [facility]. Schedule an appointment as soon as possible for a visit in 2 weeks.   Contact information:   [address] [place] [id number] [phone]      Discharge Medications     Medication List    STOP taking these medications       denosumab 60 MG/ML Soln injection  Commonly known as:  PROLIA     estradiol 25 MCG vaginal tablet  Commonly known as:  VAGIFEM     HYDROcodone-acetaminophen 5-325 MG per tablet  Commonly known as:  NORCO/VICODIN     LORazepam 1 MG tablet  Commonly known as:  ATIVAN     prednisoLONE 10 MG disintegrating tablet  Commonly known as:  ORAPRED ODT     TOVIAZ 8 MG Tb24 tablet  Generic drug:  fesoterodine      TAKE these medications       CALCIUM + D3 PO  Take 2 tablets by mouth daily.     cetirizine 10 MG tablet  Commonly known as:  ZYRTEC  Take 1 tablet (10 mg total) by mouth daily as needed for allergies.     fluticasone 50 MCG/ACT nasal spray  Commonly known as:  FLONASE  Place 2 sprays into both nostrils daily as needed for allergies or rhinitis.     folic acid 1 MG tablet  Commonly known as:  FOLVITE  Take 1 mg by mouth daily.     mercaptopurine 50 MG tablet  Commonly known as:  PURINETHOL  Take 50 mg by mouth daily. Give on an empty stomach 1 hour  before or 2 hours after meals. Caution: Chemotherapy.     mesalamine 0.375 G 24 hr capsule  Commonly known as:  APRISO  Take 1,500 mg by mouth every morning.     predniSONE 5 MG tablet  Commonly known as:  DELTASONE  Take 1 tablet (5 mg total) by mouth daily with breakfast.  Start taking on:  08/31/2014     PROBIOTIC DAILY PO  Take 1 tablet by mouth daily. "ULTRAFLORA BRAND"      SYSTANE OP  Place 2 drops into both eyes 4 (four) times daily as needed (dry eyes).     Vitamin D3 1000 UNITS Caps  Take 1 capsule by mouth daily.         Total Time in preparing paper work, data evaluation and todays exam - 35 minutes  Shemuel Harkleroad M.D on 08/30/2014 at 1:16 PM  [place]  [phone]

## 2014-08-30 NOTE — Evaluation (Signed)
Reviewed assessment and agree with POC.  Alben Deeds, [place] DPT  [phone]

## 2014-08-30 NOTE — Evaluation (Addendum)
Physical Therapy Evaluation Patient Details Name: Diane [name] MRN: [id number] DOB: [birth year] Today's Date: 08/30/2014   History of Present Illness  Pt is a 63 y.o. female admitted for steriod-induced psychosis with syncope, mania, and Grand mal seizure. Hx of SBO, SUI, pelvic prolapse, osteoporosis, Crohn's disease and arthrisis.  Clinical Impression  Pt is able to perform transfers and bed mobility independently. Pt requires min assist for ambulation and stairs with hand hold assist for stability--no physical assist required. Per conversation with pt, she is at baseline. POC and d/c plan discussed with pt. No further PT recommended. Recommend home with supervision for mobility.     Follow Up Recommendations Supervision for mobility/OOB    Equipment Recommendations  None recommended by PT    Recommendations for Other Services       Precautions / Restrictions Precautions Precautions: Fall Precaution Comments: pt with some mild instability during ambulation Restrictions Weight Bearing Restrictions: No      Mobility  Bed Mobility Overal bed mobility: Independent             General bed mobility comments: no assistance. HOB flat, no use of rails  Transfers Overall transfer level: Independent Equipment used: None Transfers: Sit to/from Stand Sit to Stand: Independent         General transfer comment: Pt able to power up without assist. no cues required  Ambulation/Gait Ambulation/Gait assistance: Min guard;Min assist Ambulation Distance (Feet): 200 Feet Assistive device: 1 person hand held assist Gait Pattern/deviations: Step-through pattern;Wide base of support;Drifts right/left   Gait velocity interpretation: at or above normal speed for age/gender General Gait Details: No physical assist required, min assist for stability only. Pt with some instability during ambulation. pt able to amb <30' without hand held assist, but demonstrates some instability. use of  hand hold for longer distance to provide stability.   Stairs Stairs: Yes Stairs assistance: Min assist Stair Management: No rails;Forwards;Alternating pattern (hand hold assist) Number of Stairs: 4 General stair comments: pt able to navigate stairs with hand hold assist for stability. no other physical assist required  Wheelchair Mobility    Modified Rankin (Stroke Patients Only)       Balance Overall balance assessment: Needs assistance   Sitting balance-Leahy Scale: Normal Sitting balance - Comments: no deficits   Standing balance support: No upper extremity supported Standing balance-Leahy Scale: Good Standing balance comment: no instability noted with static standing or during weight shifting. instability increase with ambulation.                             Pertinent Vitals/Pain Pain Assessment: No/denies pain    Home Living Family/patient expects to be discharged to:: Private residence Living Arrangements: Spouse/significant other Available Help at Discharge: Family;Available PRN/intermittently Type of Home: House Home Access: Stairs to enter Entrance Stairs-Rails: None Entrance Stairs-Number of Steps: 3 Home Layout: One level Home Equipment: None Additional Comments: pt reports husband is planning to install rail for stairs to enter home    Prior Function Level of Independence: Independent               Hand Dominance        Extremity/Trunk Assessment   Upper Extremity Assessment: Overall WFL for tasks assessed           Lower Extremity Assessment: Overall WFL for tasks assessed      Cervical / Trunk Assessment: Normal  Communication   Communication: No difficulties  Cognition Arousal/Alertness: Awake/alert  Behavior During Therapy: WFL for tasks assessed/performed Overall Cognitive Status: Within Functional Limits for tasks assessed                      General Comments General comments (skin integrity, edema,  etc.): d/c plan discussed with pt, pt agreeable. pt reports that daughters and husband can be around at d/c to provide assist and supervision as needed. Pt instructed on safety with stair navigation without rails. Pt reports her husband is planning to install rails if needed, encouraged pt to have husband install them. Pt interested in starting to exercise at gym.     Exercises        Assessment/Plan    PT Assessment Patent does not need any further PT services  PT Diagnosis Difficulty walking   PT Problem List    PT Treatment Interventions     PT Goals (Current goals can be found in the Care Plan section) Acute Rehab PT Goals Patient Stated Goal: to go home PT Goal Formulation: All assessment and education complete, DC therapy    Frequency     Barriers to discharge        Co-evaluation               End of Session Equipment Utilized During Treatment: Gait belt Activity Tolerance: Patient tolerated treatment well Patient left: in chair;with call bell/phone within reach Nurse Communication: Mobility status         Time: 1130-1145 PT Time Calculation (min): 15 min   Charges:         PT G Codes:          Hameed Kolar 08/30/2014, 12:40 PM Levonne Hubert, SPT

## 2014-08-30 NOTE — Progress Notes (Signed)
Subjective:  Currently resting comfortably. Feels she is back to her baseline.  Diane [name] is an 63 y.o. female presenting to the ED at [facility] with AMS. Patient has had agitation, hallucinations, and behavioral changes including not sleeping in the past 3 days. Sleeping aids have not helped her symptoms. She has been having some degree of symptoms since being started on prednisone for the past 2 weeks for her Crohn's flare up which started 1 month ago. They worsened significantly about 3 days ago. Because of symptoms she stopped her Prednisone abruptly and took none for about 2 days. She called her doctor and was given something to take as a substitute. She has continued to have an altered mental state though. Fever has not been noted.  Earlier this evening while at [facility] she fell and hit her head while in the bathroom. Patient was amnestic of the event. The event was not witnessed. When found patient had minimal responsiveness. CT head after that episode was negative.  Objective: Current vital signs: BP 106/61  Pulse 80  Temp(Src) 97.8 F (36.6 C) (Oral)  Resp 15  Ht 5' 2"  (1.575 m)  Wt 63.866 kg (140 lb 12.8 oz)  BMI 25.75 kg/m2  SpO2 100% Vital signs in last 24 hours: Temp:  [97.6 F (36.4 C)-98.8 F (37.1 C)] 97.8 F (36.6 C) (10/28 0459) Pulse Rate:  [80-96] 80 (10/28 0459) Resp:  [14-27] 15 (10/28 0459) BP: (90-106)/(53-69) 106/61 mmHg (10/28 0459) SpO2:  [97 %-100 %] 100 % (10/28 0459)  Intake/Output from previous day: 10/27 0701 - 10/28 0700 In: -  Out: 2100 [Urine:2100] Intake/Output this shift:   Nutritional status: Bland  Neurologic Exam: Mental Status:  Alert, responsive, oriented x 3, follows simple commands.  Cranial Nerves:  PERRL, EOMI, V1-V3 intact to LT, face symmetric Motor:  Patient moves all extremities against gravity and light resistance, no focal weakness Sensory:  LT intact in all extremities Deep Tendon Reflexes:  2+ throughout.  Plantars:   upgoing bilaterally    Lab Results: Basic Metabolic Panel:  Recent Labs Lab 08/28/14 1630 08/28/14 2250 08/29/14 0235 08/30/14 0242  NA 138 139 138 141  K 3.3* 3.2* 3.9 3.6*  CL 96 98 101 104  CO2 22 19 23 25   GLUCOSE 110* 98 75 100*  BUN 12 8 7 8   CREATININE 0.70 0.61 0.54 0.55  CALCIUM 9.5 8.8 8.5 8.7    Liver Function Tests:  Recent Labs Lab 08/28/14 1630 08/28/14 2250  AST 52* 43*  ALT 64* 54*  ALKPHOS 103 95  BILITOT 0.5 0.5  PROT 7.0 6.6  ALBUMIN 3.4* 3.2*   No results found for this basename: LIPASE, AMYLASE,  in the last 168 hours  Recent Labs Lab 08/29/14 0335  AMMONIA 42    CBC:  Recent Labs Lab 08/28/14 1630 08/28/14 2250 08/30/14 0242  WBC 9.2 7.1 7.1  NEUTROABS 6.3  --   --   HGB 11.8* 11.2* 10.4*  HCT 37.3 35.0* 32.7*  MCV 90.3 87.7 88.4  PLT 353 290 254    Cardiac Enzymes:  Recent Labs Lab 08/28/14 1703  TROPONINI <0.30    Lipid Panel: No results found for this basename: CHOL, TRIG, HDL, CHOLHDL, VLDL, LDLCALC,  in the last 168 hours  CBG:  Recent Labs Lab 08/28/14 [place] 85    Microbiology: Results for orders placed during the hospital encounter of 08/28/14  URINE CULTURE     Status: None   Collection Time  08/28/14  5:29 PM      Result Value Ref Range Status   Specimen Description URINE, CATHETERIZED   Final   Special Requests NONE   Final   Culture  Setup Time     Final   Value: 08/28/2014 21:05     Performed at [place]     Final   Value: NO GROWTH     Performed at [facility]   Culture     Final   Value: NO GROWTH     Performed at [facility]   Report Status 08/29/2014 FINAL   Final  MRSA PCR SCREENING     Status: None   Collection Time    08/29/14 12:38 AM      Result Value Ref Range Status   MRSA by PCR NEGATIVE  NEGATIVE Final   Comment:            The GeneXpert MRSA Assay (FDA     approved for NASAL specimens     only), is one component  of a     comprehensive MRSA colonization     surveillance program. It is not     intended to diagnose MRSA     infection nor to guide or     monitor treatment for     MRSA infections.    Coagulation Studies: No results found for this basename: LABPROT, INR,  in the last 72 hours  Imaging: Dg Chest 2 View  08/28/2014   CLINICAL DATA:  Altered mental status. Patient has been on multiple medications in the last month and has not slept in 3 days. No complaints of chest pain or shortness of breath.  EXAM: CHEST  2 VIEW  COMPARISON:  04/25/2014.  FINDINGS: Cardiac silhouette is normal in size. Normal mediastinal and hilar contours. Clear lungs. No pleural effusion or pneumothorax.  Bony thorax is intact.  IMPRESSION: No active cardiopulmonary disease.   Electronically Signed   By: Lajean Manes M.D.   On: 08/28/2014 17:19   Ct Head Wo Contrast  08/28/2014   CLINICAL DATA:  Fall, injury, altered mental status, personal history of Crohn's disease  EXAM: CT HEAD WITHOUT CONTRAST  CT CERVICAL SPINE WITHOUT CONTRAST  TECHNIQUE: Multidetector CT imaging of the head and cervical spine was performed following the standard protocol without intravenous contrast. Multiplanar CT image reconstructions of the cervical spine were also generated.  COMPARISON:  None  FINDINGS: CT HEAD FINDINGS  Normal ventricular morphology.  No midline shift or mass effect.  Normal appearance of brain parenchyma.  No intracranial hemorrhage, mass lesion, or evidence acute infarction.  No extra-axial fluid collections.  Bones and sinuses unremarkable.  CT CERVICAL SPINE FINDINGS  Visualized skullbase intact.  Lung apices clear.  Prevertebral soft tissues normal thickness.  Disc space narrowing with endplate spur formation at C4-C5 through C6-C7.  Vertebral body heights maintained without fracture or bone destruction.  Minimal retrolisthesis at C5-C6 and C6-C7.  Multilevel facet degenerative changes of the cervical spine.  Uncovertebral  spurs encroach upon the cervical neural foramina bilaterally at C5-C6 and to a lesser degree C6-C7.  Soft tissues unremarkable.  IMPRESSION: No acute intracranial abnormalities.  Multilevel degenerative disc and facet disease changes of the cervical spine as above.  No acute cervical spine abnormalities.   Electronically Signed   By: Lavonia Dana M.D.   On: 08/28/2014 17:18   Ct Cervical Spine Wo Contrast  08/28/2014   CLINICAL DATA:  Fall,  injury, altered mental status, personal history of Crohn's disease  EXAM: CT HEAD WITHOUT CONTRAST  CT CERVICAL SPINE WITHOUT CONTRAST  TECHNIQUE: Multidetector CT imaging of the head and cervical spine was performed following the standard protocol without intravenous contrast. Multiplanar CT image reconstructions of the cervical spine were also generated.  COMPARISON:  None  FINDINGS: CT HEAD FINDINGS  Normal ventricular morphology.  No midline shift or mass effect.  Normal appearance of brain parenchyma.  No intracranial hemorrhage, mass lesion, or evidence acute infarction.  No extra-axial fluid collections.  Bones and sinuses unremarkable.  CT CERVICAL SPINE FINDINGS  Visualized skullbase intact.  Lung apices clear.  Prevertebral soft tissues normal thickness.  Disc space narrowing with endplate spur formation at C4-C5 through C6-C7.  Vertebral body heights maintained without fracture or bone destruction.  Minimal retrolisthesis at C5-C6 and C6-C7.  Multilevel facet degenerative changes of the cervical spine.  Uncovertebral spurs encroach upon the cervical neural foramina bilaterally at C5-C6 and to a lesser degree C6-C7.  Soft tissues unremarkable.  IMPRESSION: No acute intracranial abnormalities.  Multilevel degenerative disc and facet disease changes of the cervical spine as above.  No acute cervical spine abnormalities.   Electronically Signed   By: Lavonia Dana M.D.   On: 08/28/2014 17:18   Mr Jeri Cos NO Contrast  08/29/2014   CLINICAL DATA:  New onset seizure.  Altered mental status, agitation, hallucinations, and behavioral changes for the past 3 days. Recent fall with head injury and loss of consciousness followed by a grand mal seizure.  EXAM: MRI HEAD WITHOUT AND WITH CONTRAST  TECHNIQUE: Multiplanar, multiecho pulse sequences of the brain and surrounding structures were obtained without and with intravenous contrast.  CONTRAST:  82m MULTIHANCE GADOBENATE DIMEGLUMINE 529 MG/ML IV SOLN  COMPARISON:  Head CT 08/28/2014  FINDINGS: Dedicated thin section imaging through the temporal lobes demonstrates normal volume and signal of the hippocampi. There is no evidence of heterotopia.  There is no evidence of acute infarct, intracranial hemorrhage, mass, midline shift, or extra-axial fluid collection. Ventricles and sulci are within normal limits for age. There are a few small foci of T2 hyperintensity in the periventricular white matter bilaterally. There is no abnormal enhancement.  Orbits are unremarkable. Paranasal sinuses and mastoid air cells are clear. Major intracranial vascular flow voids are preserved.  IMPRESSION: 1. No acute intracranial abnormality, mass, or etiology of seizures identified. 2. Small foci of periventricular white matter T2 signal abnormality, nonspecific but may reflect minimal chronic small vessel ischemic disease.   Electronically Signed   By: ALogan Bores  On: 08/29/2014 20:18    Medications:  Scheduled: . fesoterodine  8 mg Oral QHS  . heparin  5,000 Units Subcutaneous 3 times per day  . lactose free nutrition  237 mL Oral TID WC  . mercaptopurine  50 mg Oral Daily  . mesalamine  1,500 mg Oral Daily  . prednisoLONE  10 mg Oral Daily   Followed by  . [START ON 09/01/2014] prednisoLONE  5 mg Oral Daily  . 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 While being evaluated had a witnessed seizure. [facility] Seizure likely provoked. Patient has had many medication issues recently and has also been sleep deprived. Returned to baseline mental status at this time. MRI and EEG overall unremarkable. Exam non-focal.  -no  further neurological workup at this time -would hold on starting an antiepileptic as seizure episode likely provoked -follow up with outpatient neurology upon discharge     LOS: 2 days   Jim Like, DO [facility] [phone]  If 7pm- 7am, please page neurology on call as listed in [place]. 08/30/2014  7:50 AM

## 2014-09-01 ENCOUNTER — Encounter (HOSPITAL_COMMUNITY): Payer: Self-pay | Admitting: Emergency Medicine

## 2014-09-01 ENCOUNTER — Emergency Department (EMERGENCY_DEPARTMENT_HOSPITAL)
Admission: EM | Admit: 2014-09-01 | Discharge: 2014-09-05 | Disposition: A | Discharge status: Other Acute Inpatient Hospital | Payer: 59 | Source: Home / Self Care | Attending: Emergency Medicine | Admitting: Emergency Medicine

## 2014-09-01 DIAGNOSIS — Z79899 Other long term (current) drug therapy: Secondary | ICD-10-CM | POA: Insufficient documentation

## 2014-09-01 DIAGNOSIS — G478 Other sleep disorders: Secondary | ICD-10-CM

## 2014-09-01 DIAGNOSIS — Z8709 Personal history of other diseases of the respiratory system: Secondary | ICD-10-CM | POA: Insufficient documentation

## 2014-09-01 DIAGNOSIS — Z7952 Long term (current) use of systemic steroids: Secondary | ICD-10-CM | POA: Insufficient documentation

## 2014-09-01 DIAGNOSIS — Z87891 Personal history of nicotine dependence: Secondary | ICD-10-CM

## 2014-09-01 DIAGNOSIS — Z8739 Personal history of other diseases of the musculoskeletal system and connective tissue: Secondary | ICD-10-CM

## 2014-09-01 DIAGNOSIS — Z8719 Personal history of other diseases of the digestive system: Secondary | ICD-10-CM

## 2014-09-01 DIAGNOSIS — Z87448 Personal history of other diseases of urinary system: Secondary | ICD-10-CM

## 2014-09-01 DIAGNOSIS — R4182 Altered mental status, unspecified: Secondary | ICD-10-CM | POA: Diagnosis not present

## 2014-09-01 DIAGNOSIS — Z8742 Personal history of other diseases of the female genital tract: Secondary | ICD-10-CM | POA: Insufficient documentation

## 2014-09-01 DIAGNOSIS — T380X5A Adverse effect of glucocorticoids and synthetic analogues, initial encounter: Secondary | ICD-10-CM | POA: Diagnosis not present

## 2014-09-01 DIAGNOSIS — I959 Hypotension, unspecified: Secondary | ICD-10-CM

## 2014-09-01 DIAGNOSIS — F29 Unspecified psychosis not due to a substance or known physiological condition: Secondary | ICD-10-CM

## 2014-09-01 LAB — RAPID URINE DRUG SCREEN, HOSP PERFORMED
AMPHETAMINES: NOT DETECTED
Barbiturates: NOT DETECTED
Benzodiazepines: NOT DETECTED
COCAINE: NOT DETECTED
OPIATES: NOT DETECTED
TETRAHYDROCANNABINOL: NOT DETECTED

## 2014-09-01 LAB — CBC WITH DIFFERENTIAL/PLATELET
BASOS ABS: 0 10*3/uL (ref 0.0–0.1)
Basophils Relative: 0 % (ref 0–1)
EOS ABS: 0.1 10*3/uL (ref 0.0–0.7)
Eosinophils Relative: 1 % (ref 0–5)
HEMATOCRIT: 35.6 % — AB (ref 36.0–46.0)
HEMOGLOBIN: 11.6 g/dL — AB (ref 12.0–15.0)
Lymphocytes Relative: 16 % (ref 12–46)
Lymphs Abs: 1.1 10*3/uL (ref 0.7–4.0)
MCH: 28.8 pg (ref 26.0–34.0)
MCHC: 32.6 g/dL (ref 30.0–36.0)
MCV: 88.3 fL (ref 78.0–100.0)
MONO ABS: 0.5 10*3/uL (ref 0.1–1.0)
MONOS PCT: 8 % (ref 3–12)
Neutro Abs: 5.3 10*3/uL (ref 1.7–7.7)
Neutrophils Relative %: 75 % (ref 43–77)
Platelets: 288 10*3/uL (ref 150–400)
RBC: 4.03 MIL/uL (ref 3.87–5.11)
RDW: 16 % — ABNORMAL HIGH (ref 11.5–15.5)
WBC: 7 10*3/uL (ref 4.0–10.5)

## 2014-09-01 LAB — URINALYSIS, ROUTINE W REFLEX MICROSCOPIC
Bilirubin Urine: NEGATIVE
GLUCOSE, UA: NEGATIVE mg/dL
Hgb urine dipstick: NEGATIVE
KETONES UR: NEGATIVE mg/dL
LEUKOCYTES UA: NEGATIVE
Nitrite: NEGATIVE
PH: 7 (ref 5.0–8.0)
Protein, ur: NEGATIVE mg/dL
Specific Gravity, Urine: 1.004 — ABNORMAL LOW (ref 1.005–1.030)
Urobilinogen, UA: 0.2 mg/dL (ref 0.0–1.0)

## 2014-09-01 LAB — COMPREHENSIVE METABOLIC PANEL
ALT: 42 U/L — ABNORMAL HIGH (ref 0–35)
ANION GAP: 15 (ref 5–15)
AST: 30 U/L (ref 0–37)
Albumin: 3.5 g/dL (ref 3.5–5.2)
Alkaline Phosphatase: 97 U/L (ref 39–117)
BILIRUBIN TOTAL: 0.4 mg/dL (ref 0.3–1.2)
BUN: 11 mg/dL (ref 6–23)
CO2: 26 mEq/L (ref 19–32)
CREATININE: 0.61 mg/dL (ref 0.50–1.10)
Calcium: 9.5 mg/dL (ref 8.4–10.5)
Chloride: 100 mEq/L (ref 96–112)
GFR calc Af Amer: >90 mL/min (ref >90)
GFR calc non Af Amer: >90 mL/min (ref >90)
Glucose, Bld: 108 mg/dL — ABNORMAL HIGH (ref 70–99)
Potassium: 3.5 mEq/L — ABNORMAL LOW (ref 3.7–5.3)
Sodium: 141 mEq/L (ref 137–147)
Total Protein: 7 g/dL (ref 6.0–8.3)

## 2014-09-01 LAB — ETHANOL: Alcohol, Ethyl (B): <11 mg/dL (ref 0–11)

## 2014-09-01 MED ORDER — MESALAMINE 400 MG PO CPDR
1600.0000 mg | DELAYED_RELEASE_CAPSULE | ORAL | Status: DC
  Administered 2014-09-02 – 2014-09-05 (×4): 1600 mg via ORAL
  Filled 2014-09-01 (×5): qty 4

## 2014-09-01 MED ORDER — MERCAPTOPURINE 50 MG PO TABS
50.0000 mg | ORAL_TABLET | Freq: Every day | ORAL | Status: DC
Start: 2014-09-01 — End: 2014-09-05
  Administered 2014-09-01 – 2014-09-05 (×5): 50 mg via ORAL
  Filled 2014-09-01 (×5): qty 1

## 2014-09-01 MED ORDER — ACETAMINOPHEN 325 MG PO TABS
650.0000 mg | ORAL_TABLET | ORAL | Status: DC | PRN
  Administered 2014-09-01: 650 mg via ORAL
  Filled 2014-09-01: qty 2

## 2014-09-01 MED ORDER — HEPARIN BOLUS VIA INFUSION: 4000.0000 [IU] | Freq: Once | INTRAVENOUS | Status: DC

## 2014-09-01 MED ORDER — HEPARIN (PORCINE) IN NACL 100-0.45 UNIT/ML-% IJ SOLN: 1100.0000 [IU]/h | INTRAMUSCULAR | Status: DC

## 2014-09-01 MED ORDER — ONDANSETRON HCL 4 MG PO TABS: 4.0000 mg | ORAL_TABLET | Freq: Three times a day (TID) | ORAL | Status: DC | PRN

## 2014-09-01 NOTE — BH Assessment (Signed)
Assessment completed. Consulted Waylan Boga, NP who recommended inpatient treatment so that pt can stabilize. Dr.Glick has been informed of the recommendation.

## 2014-09-01 NOTE — ED Notes (Signed)
Dinner ordered for patient.

## 2014-09-01 NOTE — ED Notes (Signed)
Pt given water and blankets.

## 2014-09-01 NOTE — ED Notes (Signed)
Pt sent to ED via POV by the Primary MD to see the' psychiatrist'. Pt states she is on predisone for chron's since 9/25. States she has started having seizures and is sleep deprived. Last sleep was 2 nights ago. Pt answering all questions appropriately and engaged in assessment. Denies pain. Pt was recently admitted into the hospital. Denies SI/HI. Pt states she knows she is repeating herself and is sometimes not making sense but 'not sure why'.

## 2014-09-01 NOTE — ED Notes (Signed)
Pt finished telepysch.

## 2014-09-01 NOTE — ED Notes (Signed)
Needs TTS

## 2014-09-01 NOTE — BH Assessment (Signed)
Contacted pt's husband Shanette Tamargo who reported that he is pt's legal guardian. Requested that pt's husband bring a copy of guardianship paperwork to his visit tomorrow.

## 2014-09-01 NOTE — BH Assessment (Signed)
Tele Assessment Note   Diane [name] is an 63 y.o. female referred to [facility] ED by her PCP. Pt reported that she has been prescribed prednisone.  Pt denies SI and HI at this time. PT reported that she is experiencing AVH at this time. Pt shared that she has been experiencing hallucinations for the past month. Pt did not report any previous suicide attempts or psychiatric hospitalizations. Pt is endorsing multiple depressive symptoms and shared that she has had difficulty sleeping and her appetite is poor. Pt denied having access to weapons and firearms and did not report any upcoming court dates or pending criminal charges. Pt did not report any alcohol or illicit substance use at this time. Pt did not report any physical, sexual or emotional abuse at this time.  Pt is alert and oriented x3. Pt is calm and cooperative throughout this assessment. Pt maintained good eye contact throughout this assessment. PT speech is logical/coherent but soft and slow at times. Pt mood is depressed and her affect is congruent with her mood. Pt thought process is coherent and relevant.  Collateral information was gathered from pt's husband who reported that pt has multiple health issues and was recently started on prednisone. He reported that after pt began taking prednisone she began staying up late to not sleep for 4 days at a time. He also reported that pt has difficulty finishing her sentences and has become increasingly irritated. He shared that pt has been writing notes and taking all the dishes out. He also reported that pt was standing at the stove for approximately 30 mins just pushing buttons on the stove because she thought she was baking a cake. He shared that her appetite has been poor as well and when she requested some crackers and peanut butter she just counted the crackers multiple times. He reported that on the 26th she had a grand mal seizure and was admitted to the hospital. He reported that pt has not had any  prednisone in the past 2 days; however she has been not slept since being discharged from the hospital on 08/30/14.   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 History of small bowel obstruction     2011--  [facility] --  RESOLVED -- NO SURGICAL INTERVENTION  . SUI (stress urinary incontinence, female)   . Pelvic prolapse   . Osteoporosis   . History of endometriosis   . PONV (postoperative nausea and vomiting)     AND HARD TO WAKE  . Crohn's disease   . Allergic rhinitis   . Arthritis     Past Surgical History  Procedure Laterality Date  . Laparoscopy/ lysis adhesions/ left salpingoophorectomy/ right ovarian cystectomy  06-08-2000  . Abdominal hysterectomy  1980  . Benign excision right breast cyst  1996  . Exploratory laparotomy  EARLY 1970'S    INFERLITITY  . Pubovaginal sling N/A 06/13/2013    Procedure: PELVIC FLOOR REPAIR  ,  WITH  UPHOLD  , ANTERIOR VAULT WITH KELLY PLICATION WITH LYNX SLING;  Surgeon: Ailene Rud, MD;  Location: [facility];  Service: Urology;  Laterality: N/A;  . Cystoscopy N/A 06/13/2013    Procedure: CYSTOSCOPY;  Surgeon: Ailene Rud, MD;  Location: [facility];  Service: Urology;  Laterality: N/A;  . Anterior and posterior repair  06/13/2013    Procedure:  AUGUEMENTED  RECTOCELE REPAIR WITH XENFORM;  Surgeon: Pierre Bali  Joya San, MD;  Location: [facility];  Service: Urology;;    Family History: History reviewed. No pertinent family history.  Social History:  reports that she has quit smoking. Her smoking use included Cigarettes. She has a 30 pack-year smoking history. She has never used smokeless tobacco. She reports that she drinks about 4.2 ounces of alcohol per week. She reports that she does not use illicit drugs.  Additional Social History:  Alcohol / Drug Use History of alcohol / drug use?: No history  of alcohol / drug abuse  CIWA: CIWA-Ar BP: 136/66 mmHg Pulse Rate: 98 COWS:    PATIENT STRENGTHS: (choose at least two) Average or above average intelligence Supportive family/friends  Allergies:  Allergies  Allergen Reactions  . Celebrex [Celecoxib] Anaphylaxis  . Actonel [Risedronate Sodium] Other (See Comments)    GI UPSET  . Boniva [Ibandronic Acid] Other (See Comments)    GI UPSET  . Chantix [Varenicline] Other (See Comments)    MOOD CHANGE  . Fosamax [Alendronate Sodium] Other (See Comments)    GI UPSET  . Neosporin [Neomycin-Bacitracin Zn-Polymyx] Swelling and Other (See Comments)    REDDNESS  . Nitrofuran Derivatives Other (See Comments)    CHILLS AND FEVER  . Tobradex [Tobramycin-Dexamethasone] Other (See Comments)    SEVERE EYE IRRIATION    Home Medications:  (Not in a hospital admission)  OB/GYN Status:  No LMP recorded. Patient has had a hysterectomy.  General Assessment Data Location of Assessment: [facility] ED Is this a Tele or Face-to-Face Assessment?: Tele Assessment Is this an Initial Assessment or a Re-assessment for this encounter?: Initial Assessment Living Arrangements: Spouse/significant other Can pt return to current living arrangement?: Yes Admission Status: Voluntary Is patient capable of signing voluntary admission?: Yes Transfer from: Home Referral Source: Self/Family/Friend     [place] Living Arrangements: Spouse/significant other Name of Psychiatrist: No provider reported Name of Therapist: No provider reported  Education Status Is patient currently in school?: No  Risk to self with the past 6 months Suicidal Ideation: No Suicidal Intent: No Is patient at risk for suicide?: No Suicidal Plan?: No Access to Means: No What has been your use of drugs/alcohol within the last 12 months?: No alcohol or drug use reported. Previous Attempts/Gestures: No How many times?: 0 Other Self Harm Risks: No other self harm risk  identified  Triggers for Past Attempts: None known Intentional Self Injurious Behavior: None Family Suicide History: No Recent stressful life event(s):  (No stressful events reported. ) Persecutory voices/beliefs?: No Depression: Yes Depression Symptoms: Despondent;Insomnia;Tearfulness;Guilt;Fatigue;Loss of interest in usual pleasures;Feeling worthless/self pity;Feeling angry/irritable Substance abuse history and/or treatment for substance abuse?: No Suicide prevention information given to non-admitted patients: Not applicable  Risk to Others within the past 6 months Homicidal Ideation: No Thoughts of Harm to Others: No Current Homicidal Intent: No Current Homicidal Plan: No Access to Homicidal Means: No Identified Victim: N/A History of harm to others?: No Assessment of Violence: None Noted Violent Behavior Description: No violent behaviors observed. Pt is calm and cooperative at this time.  Does patient have access to weapons?: No Criminal Charges Pending?: No Does patient have a court date: No  Psychosis Hallucinations: Auditory;Visual Delusions: None noted  Mental Status Report Appear/Hygiene: In scrubs Eye Contact: Good Motor Activity: Freedom of movement Speech: Logical/coherent;Soft;Slow Level of Consciousness: Quiet/awake Mood: Depressed Affect: Appropriate to circumstance Anxiety Level: None Thought Processes: Coherent;Relevant Judgement: Partial Orientation: Appropriate for developmental age Obsessive Compulsive Thoughts/Behaviors: None  Cognitive Functioning Concentration: Fair  Memory: Recent Intact;Remote Intact IQ: Average Insight: Fair Impulse Control: Fair Appetite: Poor Weight Loss:  (unknown) Weight Gain: 0 Sleep: Decreased Total Hours of Sleep:  (No sleep past 48 hours) Vegetative Symptoms: Decreased grooming  ADLScreening [facility]) Patient's cognitive ability adequate to safely complete daily activities?: Yes Patient able to  express need for assistance with ADLs?: Yes Independently performs ADLs?: Yes (appropriate for developmental age)  Prior Inpatient Therapy Prior Inpatient Therapy: No  Prior Outpatient Therapy Prior Outpatient Therapy: No  ADL Screening (condition at time of admission) Patient's cognitive ability adequate to safely complete daily activities?: Yes Patient able to express need for assistance with ADLs?: Yes Independently performs ADLs?: Yes (appropriate for developmental age)       Abuse/Neglect Assessment (Assessment to be complete while patient is alone) Physical Abuse: Denies Verbal Abuse: Denies Sexual Abuse: Denies Exploitation of patient/patient's resources: Denies Self-Neglect: Denies     Regulatory affairs officer (For Healthcare) Does patient have an advance directive?: No    Additional Information 1:1 In Past 12 Months?: No CIRT Risk: No Elopement Risk: No Does patient have medical clearance?: Yes     Disposition:  Disposition Initial Assessment Completed for this Encounter: Yes Disposition of Patient: Inpatient treatment program Type of inpatient treatment program: Adult  Chanler Schreiter S 09/01/2014 9:16 PM

## 2014-09-01 NOTE — ED Notes (Signed)
Pt OOB with no assistance to restroom. Sheets changed.

## 2014-09-01 NOTE — ED Provider Notes (Signed)
CSN: [id number]     Arrival date & time 09/01/14  1048 History   First MD Initiated Contact with Patient 09/01/14 1102     Chief Complaint  Patient presents with  . Altered Mental Status     (Consider location/radiation/quality/duration/timing/severity/associated sxs/prior Treatment) HPI Comments: Patient sent to the ER by primary doctor for possible psychiatric evaluation. Patient has had ongoing problems with mental status changes for approximately a month. Patient was started on prednisone for her Crohn's disease when she started having strange behavior. 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 Family reports that she has not improved. She has not slept more than 24 hours. Over the last month she has had significant problems with poor sleep. Family reports today that she seems more confused and inappropriate with her speech patterns. She has reportedly been counting things over and over again today. Everything she sees she counts repeatedly. She reports that she is aware of the counting problem, but cannot stop it. She has been reportedly fixated on religion over the last week or more. She has been "seeing numbers" and trying to interpret them "according to the [place]".  She reportedly had a generalized tonic-clonic seizure during her last hospitalization. She had an MRI and EEG performed during hospitalization. She was felt to be secondary to the medications she was on, lack of sleep, no treatment was recommended. Family is concerned because she is starting to show some of the signs she had right before she had the seizure last time (confusion, speech patterns).  Patient is a 63 y.o. female presenting with altered mental status.  Altered Mental Status Presenting symptoms: confusion   Associated symptoms: no headaches     Past Medical History  Diagnosis Date  . History of small bowel obstruction     2011--  [facility] --  RESOLVED -- NO SURGICAL  INTERVENTION  . SUI (stress urinary incontinence, female)   . Pelvic prolapse   . Osteoporosis   . History of endometriosis   . PONV (postoperative nausea and vomiting)     AND HARD TO WAKE  . Crohn's disease   . Allergic rhinitis   . Arthritis    Past Surgical History  Procedure Laterality Date  . Laparoscopy/ lysis adhesions/ left salpingoophorectomy/ right ovarian cystectomy  06-08-2000  . Abdominal hysterectomy  1980  . Benign excision right breast cyst  1996  . Exploratory laparotomy  EARLY 1970'S    INFERLITITY  . Pubovaginal sling N/A 06/13/2013    Procedure: PELVIC FLOOR REPAIR  ,  WITH  UPHOLD  , ANTERIOR VAULT WITH KELLY PLICATION WITH LYNX SLING;  Surgeon: Ailene Rud, MD;  Location: [facility];  Service: Urology;  Laterality: N/A;  . Cystoscopy N/A 06/13/2013    Procedure: CYSTOSCOPY;  Surgeon: Ailene Rud, MD;  Location: [facility];  Service: Urology;  Laterality: N/A;  . Anterior and posterior repair  06/13/2013    Procedure:  AUGUEMENTED  RECTOCELE REPAIR WITH XENFORM;  Surgeon: Ailene Rud, MD;  Location: [facility];  Service: Urology;;   History reviewed. No pertinent family history. History  Substance Use Topics  . Smoking status: Former Smoker -- 0.75 packs/day for 40 years    Types: Cigarettes  . Smokeless tobacco: Never Used  . Alcohol Use: 4.2 oz/week    7 Glasses of wine per week   OB History   Grav Para Term Preterm Abortions TAB SAB Ect Mult  Living                 Review of Systems  Neurological: Negative for dizziness and headaches.  Psychiatric/Behavioral: Positive for confusion and sleep disturbance. Negative for suicidal ideas. The patient is hyperactive.   All other systems reviewed and are negative.     Allergies  Celebrex; Actonel; Boniva; Chantix; Fosamax; Neosporin; Nitrofuran derivatives; and Tobradex  Home Medications   Prior to Admission medications    Medication Sig Start Date End Date Taking? Authorizing Provider  Calcium Carb-Cholecalciferol (CALCIUM + D3 PO) Take 2 tablets by mouth daily.   Yes Historical Provider, MD  folic acid (FOLVITE) 1 MG tablet Take 1 mg by mouth daily.   Yes Historical Provider, MD  mercaptopurine (PURINETHOL) 50 MG tablet Take 50 mg by mouth daily. Give on an empty stomach 1 hour before or 2 hours after meals. Caution: Chemotherapy.   Yes Historical Provider, MD  mesalamine (APRISO) 0.375 G 24 hr capsule Take 1,500 mg by mouth every morning.   Yes Historical Provider, MD  Polyethyl Glycol-Propyl Glycol (SYSTANE OP) Place 2 drops into both eyes 4 (four) times daily as needed (dry eyes).   Yes Historical Provider, MD  predniSONE (DELTASONE) 5 MG tablet Take 1 tablet (5 mg total) by mouth daily with breakfast. 08/31/14 09/02/14 Yes Dawood Elgergawy, MD  Probiotic Product (PROBIOTIC DAILY PO) Take 1 tablet by mouth daily. "ULTRAFLORA BRAND"   Yes Historical Provider, MD   BP 130/65  Pulse 97  Resp 15  SpO2 100% Physical Exam  Constitutional: She is oriented to person, place, and time. She appears well-developed and well-nourished. No distress.  HENT:  Head: Normocephalic and atraumatic.  Right Ear: Hearing normal.  Left Ear: Hearing normal.  Nose: Nose normal.  Mouth/Throat: Oropharynx is clear and moist and mucous membranes are normal.  Eyes: Conjunctivae and EOM are normal. Pupils are equal, round, and reactive to light.  Neck: Normal range of motion. Neck supple.  Cardiovascular: Regular rhythm, S1 normal and S2 normal.  Exam reveals no gallop and no friction rub.   No murmur heard. Pulmonary/Chest: Effort normal and breath sounds normal. No respiratory distress. She exhibits no tenderness.  Abdominal: Soft. Normal appearance and bowel sounds are normal. There is no hepatosplenomegaly. There is no tenderness. There is no rebound, no guarding, no tenderness at McBurney's point and negative Murphy's sign. No  hernia.  Musculoskeletal: Normal range of motion.  Neurological: She is alert and oriented to person, place, and time. She has normal strength. No cranial nerve deficit or sensory deficit. Coordination normal. GCS eye subscore is 4. GCS verbal subscore is 5. GCS motor subscore is 6.  Skin: Skin is warm, dry and intact. No rash noted. No cyanosis.  Psychiatric: She has a normal mood and affect. Her behavior is normal. Thought content normal. Her speech is tangential. She expresses no homicidal and no suicidal ideation.    ED Course  Procedures (including critical care time) Labs Review Labs Reviewed  CBC WITH DIFFERENTIAL - Abnormal; Notable for the following:    Hemoglobin 11.6 (*)    HCT 35.6 (*)    RDW 16.0 (*)    All other components within normal limits  COMPREHENSIVE METABOLIC PANEL  URINE RAPID DRUG SCREEN (HOSP PERFORMED)  ETHANOL  URINALYSIS, ROUTINE W REFLEX MICROSCOPIC    Imaging Review No results found.   EKG Interpretation None      MDM   Final diagnoses:  None   psychosis  Patient sent  to the ER for further evaluation of strange behavior, hallucinations, confusion. Patient was recently hospitalized for similar symptoms, felt to be secondary to prednisone use. Symptoms are continuing today. She has a nonfocal examination. I did not feel she required any further imaging, did undergo MRI and EEG during her most recent hospitalization. Basic labs are unremarkable. At this point I feel it is reasonable to have the patient undergo psychiatric evaluation. Patient medically clear for evaluation.    Orpah Greek, MD 09/01/14 [phone]

## 2014-09-01 NOTE — ED Notes (Signed)
Pt is watching TV

## 2014-09-01 NOTE — ED Notes (Addendum)
Diane [name] husband - [phone] (cell) [phone] (h)

## 2014-09-01 NOTE — ED Notes (Signed)
Pt ate roughly 75% of her meal tray.

## 2014-09-01 NOTE — ED Notes (Signed)
Telepsych set up at bedside.

## 2014-09-01 NOTE — ED Notes (Signed)
Pt eating meal tray 

## 2014-09-01 NOTE — ED Notes (Signed)
Spoke with husband and daughter about plan of care.

## 2014-09-01 NOTE — ED Notes (Signed)
Received report from [place], [place] Pt. Transferred to [facility] C from [place].  Pt. Stable upon arrival.  Introduced pt. To RN and updated pt. On plan of care.  Pt. Verbalized understanding

## 2014-09-02 LAB — CBC WITH DIFFERENTIAL/PLATELET
Basophils Absolute: 0 10*3/uL (ref 0.0–0.1)
Basophils Relative: 0 % (ref 0–1)
Eosinophils Absolute: 0.1 10*3/uL (ref 0.0–0.7)
Eosinophils Relative: 1 % (ref 0–5)
HCT: 36.3 % (ref 36.0–46.0)
HEMOGLOBIN: 11.7 g/dL — AB (ref 12.0–15.0)
LYMPHS ABS: 1.2 10*3/uL (ref 0.7–4.0)
Lymphocytes Relative: 15 % (ref 12–46)
MCH: 28.7 pg (ref 26.0–34.0)
MCHC: 32.2 g/dL (ref 30.0–36.0)
MCV: 89.2 fL (ref 78.0–100.0)
Monocytes Absolute: 0.7 10*3/uL (ref 0.1–1.0)
Monocytes Relative: 8 % (ref 3–12)
NEUTROS ABS: 6 10*3/uL (ref 1.7–7.7)
NEUTROS PCT: 76 % (ref 43–77)
Platelets: 298 10*3/uL (ref 150–400)
RBC: 4.07 MIL/uL (ref 3.87–5.11)
RDW: 16.6 % — ABNORMAL HIGH (ref 11.5–15.5)
WBC: 7.9 10*3/uL (ref 4.0–10.5)

## 2014-09-02 LAB — BASIC METABOLIC PANEL
ANION GAP: 11 (ref 5–15)
BUN: 7 mg/dL (ref 6–23)
CHLORIDE: 104 meq/L (ref 96–112)
CO2: 24 mEq/L (ref 19–32)
Calcium: 9.2 mg/dL (ref 8.4–10.5)
Creatinine, Ser: 0.78 mg/dL (ref 0.50–1.10)
GFR calc non Af Amer: 87 mL/min — ABNORMAL LOW (ref >90)
Glucose, Bld: 101 mg/dL — ABNORMAL HIGH (ref 70–99)
Potassium: 3.9 mEq/L (ref 3.7–5.3)
SODIUM: 139 meq/L (ref 137–147)

## 2014-09-02 MED ORDER — ALPRAZOLAM 0.5 MG PO TABS
0.5000 mg | ORAL_TABLET | Freq: Once | ORAL | Status: AC
  Administered 2014-09-02: 0.5 mg via ORAL
  Filled 2014-09-02: qty 1

## 2014-09-02 MED ORDER — DIPHENHYDRAMINE HCL 25 MG PO CAPS
25.0000 mg | ORAL_CAPSULE | Freq: Once | ORAL | Status: AC
  Administered 2014-09-02: 25 mg via ORAL
  Filled 2014-09-02: qty 1

## 2014-09-02 NOTE — ED Notes (Signed)
Pt found to be incontinent of urine. Pt ambulated to restroom where she took toilet paper and wiped seat before sitting. Pt's linens changed and fresh scrubs given. 2 abrasions to back found (1 scantily bleeding. Pt's husband states where pads applied when she had seizure). Bacitracin and dressing applied. Meal heated; family at bedside encouraging her to eat.

## 2014-09-02 NOTE — ED Provider Notes (Signed)
I have rechecked the patient for continuously calling out and being tearful. This apparently has been ongoing. After reviewing her medical record it appears that there have actually been several months of waxing and waning problems with psychiatric type of symptoms. I have gone in room to evaluate the patient she is appropriately awake and answers questions. She reports that she is "hearing bodies dropping." She denies that she is having any visual hallucinations. She denies she is having any pain. The patient is aware of where she is and she knows where she lives and who her family members are. The patient apparently has been sleepless for multiple nights. Her vital signs are stable no development of fever or hypertension. At this time I will add an order for Xanax to try to assist in agitation and anxiety.  Charlesetta Shanks, MD 09/02/14 [phone]

## 2014-09-02 NOTE — ED Notes (Signed)
Family at bedside.  Lunch given.

## 2014-09-02 NOTE — ED Notes (Signed)
PT covered in warm blankets and resting; family to return at 15.

## 2014-09-02 NOTE — ED Notes (Addendum)
Heard patient hyperventilating from desk, went to patient bedside and reoriented her, reassured her that she was in a safe place. Pt now resting.

## 2014-09-02 NOTE — ED Notes (Signed)
Pt was resting upon entering room; alert to person and place. States it is February when asked what month it is. When asked why here, she states she is here because "everyone in her family is dropping like flies". Pt then begins to cry. Pt consolable and now resting.

## 2014-09-02 NOTE — ED Notes (Signed)
Husband [phone] cell [phone]  Daughter [phone]

## 2014-09-02 NOTE — BH Assessment (Signed)
Reassessment PT was orientated x3 w/pressured speech, loose association thought process, flat affect, and mood reported as "okay."  PT denied SI, HI, and VH.  PT reports AH, but would not described sound.  PT reports consuming breakfast and lunch today and sleeping the previous night w/ the assistance of a sleep aid.  PT reports crying episode today.

## 2014-09-02 NOTE — ED Notes (Signed)
Dr Johnney Killian in w/pt.

## 2014-09-02 NOTE — ED Notes (Signed)
Pt calling out for "[name]" at this time, pt reoriented and reassured that she is safe. Intermittent hyperventilating about every twenty minutes continues for patient. Reassured each time.

## 2014-09-02 NOTE — ED Notes (Signed)
telepsych set up at the bedside and being conducted at this time.

## 2014-09-02 NOTE — ED Notes (Signed)
Ambulated the patient to the restroom.

## 2014-09-02 NOTE — ED Notes (Signed)
Per Kendall Flack, [facility], will discuss pt's meds w/[facility] provider - including tapering of Prednisone rather than d/c.

## 2014-09-02 NOTE — ED Notes (Signed)
Pt ambulated to nurse desk to make a phone call.

## 2014-09-03 ENCOUNTER — Emergency Department (HOSPITAL_COMMUNITY): Payer: 59

## 2014-09-03 DIAGNOSIS — F29 Unspecified psychosis not due to a substance or known physiological condition: Secondary | ICD-10-CM

## 2014-09-03 DIAGNOSIS — F1994 Other psychoactive substance use, unspecified with psychoactive substance-induced mood disorder: Secondary | ICD-10-CM

## 2014-09-03 LAB — CBC WITH DIFFERENTIAL/PLATELET
Basophils Absolute: 0 10*3/uL (ref 0.0–0.1)
Basophils Relative: 0 % (ref 0–1)
EOS ABS: 0.2 10*3/uL (ref 0.0–0.7)
Eosinophils Relative: 2 % (ref 0–5)
HCT: 38.6 % (ref 36.0–46.0)
HEMOGLOBIN: 12 g/dL (ref 12.0–15.0)
LYMPHS ABS: 1.6 10*3/uL (ref 0.7–4.0)
LYMPHS PCT: 22 % (ref 12–46)
MCH: 29.1 pg (ref 26.0–34.0)
MCHC: 31.1 g/dL (ref 30.0–36.0)
MCV: 93.7 fL (ref 78.0–100.0)
MONOS PCT: 7 % (ref 3–12)
Monocytes Absolute: 0.6 10*3/uL (ref 0.1–1.0)
NEUTROS PCT: 69 % (ref 43–77)
Neutro Abs: 5.2 10*3/uL (ref 1.7–7.7)
Platelets: 259 10*3/uL (ref 150–400)
RBC: 4.12 MIL/uL (ref 3.87–5.11)
RDW: 16.7 % — ABNORMAL HIGH (ref 11.5–15.5)
WBC: 7.5 10*3/uL (ref 4.0–10.5)

## 2014-09-03 LAB — BASIC METABOLIC PANEL
Anion gap: 13 (ref 5–15)
BUN: 7 mg/dL (ref 6–23)
CO2: 21 mEq/L (ref 19–32)
Calcium: 8.4 mg/dL (ref 8.4–10.5)
Chloride: 111 mEq/L (ref 96–112)
Creatinine, Ser: 0.55 mg/dL (ref 0.50–1.10)
GFR calc Af Amer: >90 mL/min (ref >90)
GLUCOSE: 84 mg/dL (ref 70–99)
POTASSIUM: 4 meq/L (ref 3.7–5.3)
Sodium: 145 mEq/L (ref 137–147)

## 2014-09-03 LAB — TROPONIN I

## 2014-09-03 MED ORDER — STERILE WATER FOR INJECTION IJ SOLN
INTRAMUSCULAR | Status: AC
  Administered 2014-09-03: 1 mL
  Filled 2014-09-03: qty 10

## 2014-09-03 MED ORDER — SODIUM CHLORIDE 0.9 % IV BOLUS (SEPSIS)
500.0000 mL | Freq: Once | INTRAVENOUS | Status: AC
Start: 2014-09-03 — End: 2014-09-03
  Administered 2014-09-03: 500 mL via INTRAVENOUS

## 2014-09-03 MED ORDER — ZIPRASIDONE MESYLATE 20 MG IM SOLR
INTRAMUSCULAR | Status: AC
  Administered 2014-09-03: 10 mg
  Filled 2014-09-03: qty 20

## 2014-09-03 MED ORDER — SODIUM CHLORIDE 0.9 % IV BOLUS (SEPSIS)
500.0000 mL | Freq: Once | INTRAVENOUS | Status: AC
  Administered 2014-09-03: 500 mL via INTRAVENOUS

## 2014-09-03 MED ORDER — STERILE WATER FOR INJECTION IJ SOLN
2.0000 mL | Freq: Once | INTRAMUSCULAR | Status: DC
Start: 2014-09-03 — End: 2014-09-03

## 2014-09-03 MED ORDER — ZIPRASIDONE MESYLATE 20 MG IM SOLR
10.0000 mg | Freq: Once | INTRAMUSCULAR | Status: AC
  Administered 2014-09-03: 10 mg via INTRAMUSCULAR
  Filled 2014-09-03: qty 20

## 2014-09-03 MED ORDER — LORAZEPAM 2 MG/ML IJ SOLN
1.0000 mg | Freq: Once | INTRAMUSCULAR | Status: AC
  Administered 2014-09-03: 1 mg via INTRAVENOUS
  Filled 2014-09-03: qty 1

## 2014-09-03 MED ORDER — STERILE WATER FOR INJECTION IJ SOLN
INTRAMUSCULAR | Status: AC
  Administered 2014-09-03: 2 mL
  Filled 2014-09-03: qty 10

## 2014-09-03 MED ORDER — LORAZEPAM 2 MG/ML IJ SOLN: 1.0000 mg | Freq: Once | INTRAMUSCULAR | Status: DC

## 2014-09-03 MED ORDER — LORAZEPAM 2 MG/ML IJ SOLN
INTRAMUSCULAR | Status: AC
  Administered 2014-09-03: 2 mg
  Filled 2014-09-03: qty 1

## 2014-09-03 NOTE — Consult Note (Signed)
Telepsych Consultation   Reason for Consult: Confusion, disorganized Referring Physician:  EDP Diane [name] is an 63 y.o. female.  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 History of small bowel obstruction     2011--  [facility] --  RESOLVED -- NO SURGICAL INTERVENTION  . SUI (stress urinary incontinence, female)   . Pelvic prolapse   . Osteoporosis   . History of endometriosis   . PONV (postoperative nausea and vomiting)     AND HARD TO WAKE  . Crohn's disease   . Allergic rhinitis   . Arthritis    AXIS IV:  other psychosocial or environmental problems and problems related to social environment AXIS V:  31-40 impairment in reality testing  Plan:  Recommend psychiatric Inpatient admission when medically cleared.  Subjective:   Diane [name] is a 63 y.o. female patient admitted with Confusion.  HPI:  [name], 63 years old was seen this morning with Diane [name] and [name] at Diane bedside.  Patient is confused, disorganized and could not answer questions without drifting off Diane topic in discussion.  Per Diane [name], Diane [name], she was recently admitted and treated for confusion, fall and was sent home on Diane 28th of this month after she was stabilized.  When she got home Diane confusion continued with Diane being unable to sleep.  She was brought back on Friday last week for increased confusion and disorganization.  Diane [name] reported that during Diane initial admission last week she fell in Diane bathroom and CT Scan was done which came back WNL.  Patient's [name] reported that she has never been diagnosed with any Mental illness and is not taking any Psychotropic medications.  Patient exhibited flight of ideas, jumping from one topic to another.  Diane [name] strongly believe Diane disorganization and confusion started after she started taking Prednisone.  We have accepted  patient for admission to an inpatient Psychiatric unit.  Patient is off Diane Prednisone at this time.  We are at capacity and will be seeking placement at other facilities with available beds.  Patient could not answer questions regarding Diane safety and family denies any previous attempt to hurt self.  She denies hearing voices or seeing things.  HPI Elements:   Location:  Substance induced mood disorder, Psychosis. Quality:  severe, disorganized, confused. Severity:  severe. Timing:  acute, on going. Duration:  about a month. Context:  Questionable medication induced, new onset Psychosis .  Past Psychiatric History: Past Medical History  Diagnosis Date  . History of small bowel obstruction     2011--  [facility] --  RESOLVED -- NO SURGICAL INTERVENTION  . SUI (stress urinary incontinence, female)   . Pelvic prolapse   . Osteoporosis   . History of endometriosis   . PONV (postoperative nausea and vomiting)     AND HARD TO WAKE  . Crohn's disease   . Allergic rhinitis   . Arthritis     reports that she has quit smoking. Diane smoking use included Cigarettes. She has a 30 pack-year smoking history. She has never used smokeless tobacco. She reports that she drinks about 4.2 oz of alcohol per week. She reports that she does not use illicit drugs. History reviewed. No pertinent family history. Family History Substance Abuse: Yes, Describe: Family Supports: Yes, List: ([name] ) Living Arrangements: Spouse/significant other Can pt return to current living  arrangement?: Yes Allergies:   Allergies  Allergen Reactions  . Celebrex [Celecoxib] Anaphylaxis  . Actonel [Risedronate Sodium] Other (See Comments)    GI UPSET  . Boniva [Ibandronic Acid] Other (See Comments)    GI UPSET  . Chantix [Varenicline] Other (See Comments)    MOOD CHANGE  . Fosamax [Alendronate Sodium] Other (See Comments)    GI UPSET  . Neosporin [Neomycin-Bacitracin Zn-Polymyx] Swelling and Other (See Comments)     REDNESS  . Nitrofuran Derivatives Other (See Comments)    CHILLS AND FEVER  . Tobradex [Tobramycin-Dexamethasone] Other (See Comments)    SEVERE EYE IRRIATION    ACT Assessment Complete:  Yes:    Educational Status    Risk to Self: Risk to self with Diane past 6 months Suicidal Ideation: No Suicidal Intent: No Is patient at risk for suicide?: No Suicidal Plan?: No Access to Means: No What has been your use of drugs/alcohol within Diane last 12 months?: No alcohol or drug use reported. Previous Attempts/Gestures: No How many times?: 0 Other Self Harm Risks: No other self harm risk identified  Triggers for Past Attempts: None known Intentional Self Injurious Behavior: None Family Suicide History: No Recent stressful life event(s):  (No stressful events reported. ) Persecutory voices/beliefs?: No Depression: Yes Depression Symptoms: Despondent, Insomnia, Tearfulness, Guilt, Fatigue, Loss of interest in usual pleasures, Feeling worthless/self pity, Feeling angry/irritable Substance abuse history and/or treatment for substance abuse?: No Suicide prevention information given to non-admitted patients: Not applicable  Risk to Others: Risk to Others within Diane past 6 months Homicidal Ideation: No Thoughts of Harm to Others: No Current Homicidal Intent: No Current Homicidal Plan: No Access to Homicidal Means: No Identified Victim: N/A History of harm to others?: No Assessment of Violence: None Noted Violent Behavior Description: No violent behaviors observed. Pt is calm and cooperative at this time.  Does patient have access to weapons?: No Criminal Charges Pending?: No Does patient have a court date: No  Abuse: Abuse/Neglect Assessment (Assessment to be complete while patient is alone) Physical Abuse: Denies Verbal Abuse: Denies Sexual Abuse: Denies Exploitation of patient/patient's resources: Denies Self-Neglect: Denies  Prior Inpatient Therapy: Prior Inpatient Therapy Prior  Inpatient Therapy: No  Prior Outpatient Therapy: Prior Outpatient Therapy Prior Outpatient Therapy: No  Additional Information: Additional Information 1:1 In Past 12 Months?: No CIRT Risk: No Elopement Risk: No Does patient have medical clearance?: Yes    Objective: Blood pressure 100/49, pulse 83, temperature 97.6 F (36.4 C), temperature source Oral, resp. rate 18, height _0  (1.575 m), weight 63.866 kg (140 lb 12.8 oz), SpO2 99 %.Body mass index is 25.75 kg/(m^2). Results for orders placed or performed during Diane hospital encounter of 09/01/14 (from Diane past 72 hour(s))  CBC WITH DIFFERENTIAL     Status: Abnormal   Collection Time: 09/01/14 11:21 AM  Result Value Ref Range   WBC 7.0 4.0 - 10.5 K/uL   RBC 4.03 3.87 - 5.11 MIL/uL   Hemoglobin 11.6 (L) 12.0 - 15.0 g/dL   HCT 35.6 (L) 36.0 - 46.0 %   MCV 88.3 78.0 - 100.0 fL   MCH 28.8 26.0 - 34.0 pg   MCHC 32.6 30.0 - 36.0 g/dL   RDW 16.0 (H) 11.5 - 15.5 %   Platelets 288 150 - 400 K/uL   Neutrophils Relative % 75 43 - 77 %   Neutro Abs 5.3 1.7 - 7.7 K/uL   Lymphocytes Relative 16 12 - 46 %   Lymphs Abs 1.1 0.7 -  4.0 K/uL   Monocytes Relative 8 3 - 12 %   Monocytes Absolute 0.5 0.1 - 1.0 K/uL   Eosinophils Relative 1 0 - 5 %   Eosinophils Absolute 0.1 0.0 - 0.7 K/uL   Basophils Relative 0 0 - 1 %   Basophils Absolute 0.0 0.0 - 0.1 K/uL  Comprehensive metabolic panel     Status: Abnormal   Collection Time: 09/01/14 11:21 AM  Result Value Ref Range   Sodium 141 137 - 147 mEq/L   Potassium 3.5 (L) 3.7 - 5.3 mEq/L   Chloride 100 96 - 112 mEq/L   CO2 26 19 - 32 mEq/L   Glucose, Bld 108 (H) 70 - 99 mg/dL   BUN 11 6 - 23 mg/dL   Creatinine, Ser 0.61 0.50 - 1.10 mg/dL   Calcium 9.5 8.4 - 10.5 mg/dL   Total Protein 7.0 6.0 - 8.3 g/dL   Albumin 3.5 3.5 - 5.2 g/dL   AST 30 0 - 37 U/L   ALT 42 (H) 0 - 35 U/L   Alkaline Phosphatase 97 39 - 117 U/L   Total Bilirubin 0.4 0.3 - 1.2 mg/dL   GFR calc non Af Amer >90 >90 mL/min    GFR calc Af Amer >90 >90 mL/min    Comment: (NOTE) Diane eGFR has been calculated using Diane CKD EPI equation. This calculation has not been validated in all clinical situations. eGFR's persistently <90 mL/min signify possible Chronic Kidney Disease.   Anion gap 15 5 - 15  Ethanol     Status: None   Collection Time: 09/01/14 11:21 AM  Result Value Ref Range   Alcohol, Ethyl (B) <11 0 - 11 mg/dL    Comment:        LOWEST DETECTABLE LIMIT FOR SERUM ALCOHOL IS 11 mg/dL FOR MEDICAL PURPOSES ONLY  Drug screen panel, emergency     Status: None   Collection Time: 09/01/14 11:38 AM  Result Value Ref Range   Opiates NONE DETECTED NONE DETECTED   Cocaine NONE DETECTED NONE DETECTED   Benzodiazepines NONE DETECTED NONE DETECTED   Amphetamines NONE DETECTED NONE DETECTED   Tetrahydrocannabinol NONE DETECTED NONE DETECTED   Barbiturates NONE DETECTED NONE DETECTED    Comment:        DRUG SCREEN FOR MEDICAL PURPOSES ONLY.  IF CONFIRMATION IS NEEDED FOR ANY PURPOSE, NOTIFY LAB WITHIN 5 DAYS.        LOWEST DETECTABLE LIMITS FOR URINE DRUG SCREEN Drug Class       Cutoff (ng/mL) Amphetamine      1000 Barbiturate      200 Benzodiazepine   007 Tricyclics       121 Opiates          300 Cocaine          300 THC              50  Urinalysis, Routine w reflex microscopic     Status: Abnormal   Collection Time: 09/01/14 12:21 PM  Result Value Ref Range   Color, Urine YELLOW YELLOW   APPearance CLEAR CLEAR   Specific Gravity, Urine 1.004 (L) 1.005 - 1.030   pH 7.0 5.0 - 8.0   Glucose, UA NEGATIVE NEGATIVE mg/dL   Hgb urine dipstick NEGATIVE NEGATIVE   Bilirubin Urine NEGATIVE NEGATIVE   Ketones, ur NEGATIVE NEGATIVE mg/dL   Protein, ur NEGATIVE NEGATIVE mg/dL   Urobilinogen, UA 0.2 0.0 - 1.0 mg/dL   Nitrite NEGATIVE NEGATIVE   Leukocytes, UA  NEGATIVE NEGATIVE    Comment: MICROSCOPIC NOT DONE ON URINES WITH NEGATIVE PROTEIN, BLOOD, LEUKOCYTES, NITRITE, OR GLUCOSE <1000 mg/dL.  Basic  metabolic panel     Status: Abnormal   Collection Time: 09/02/14  2:57 PM  Result Value Ref Range   Sodium 139 137 - 147 mEq/L   Potassium 3.9 3.7 - 5.3 mEq/L   Chloride 104 96 - 112 mEq/L   CO2 24 19 - 32 mEq/L   Glucose, Bld 101 (H) 70 - 99 mg/dL   BUN 7 6 - 23 mg/dL   Creatinine, Ser 0.78 0.50 - 1.10 mg/dL   Calcium 9.2 8.4 - 10.5 mg/dL   GFR calc non Af Amer 87 (L) >90 mL/min   GFR calc Af Amer >90 >90 mL/min    Comment: (NOTE) Diane eGFR has been calculated using Diane CKD EPI equation. This calculation has not been validated in all clinical situations. eGFR's persistently <90 mL/min signify possible Chronic Kidney Disease.   Anion gap 11 5 - 15  CBC with Differential     Status: Abnormal   Collection Time: 09/02/14  2:57 PM  Result Value Ref Range   WBC 7.9 4.0 - 10.5 K/uL   RBC 4.07 3.87 - 5.11 MIL/uL   Hemoglobin 11.7 (L) 12.0 - 15.0 g/dL   HCT 36.3 36.0 - 46.0 %   MCV 89.2 78.0 - 100.0 fL   MCH 28.7 26.0 - 34.0 pg   MCHC 32.2 30.0 - 36.0 g/dL   RDW 16.6 (H) 11.5 - 15.5 %   Platelets 298 150 - 400 K/uL   Neutrophils Relative % 76 43 - 77 %   Neutro Abs 6.0 1.7 - 7.7 K/uL   Lymphocytes Relative 15 12 - 46 %   Lymphs Abs 1.2 0.7 - 4.0 K/uL   Monocytes Relative 8 3 - 12 %   Monocytes Absolute 0.7 0.1 - 1.0 K/uL   Eosinophils Relative 1 0 - 5 %   Eosinophils Absolute 0.1 0.0 - 0.7 K/uL   Basophils Relative 0 0 - 1 %   Basophils Absolute 0.0 0.0 - 0.1 K/uL   Labs are reviewed and are pertinent for unremarkable  Current Facility-Administered Medications  Medication Dose Route Frequency Provider Last Rate Last Dose  . acetaminophen (TYLENOL) tablet 650 mg  650 mg Oral Q4H PRN Orpah Greek, MD   650 mg at 09/01/14 1533  . mercaptopurine (PURINETHOL) tablet 50 mg  50 mg Oral Daily Orpah Greek, MD   50 mg at 09/03/14 0953  . Mesalamine (ASACOL) DR capsule 1,600 mg  1,600 mg Oral Rodney Booze Orpah Greek, MD   1,600 mg at 09/03/14 [phone]  .  ondansetron (ZOFRAN) tablet 4 mg  4 mg Oral Q8H PRN Orpah Greek, MD       Current Outpatient Prescriptions  Medication Sig Dispense Refill  . Calcium Carb-Cholecalciferol (CALCIUM + D3 PO) Take 2 tablets by mouth daily.    . cetirizine (ZYRTEC) 10 MG tablet Take 10 mg by mouth daily as needed for allergies.    . fesoterodine (TOVIAZ) 8 MG TB24 tablet Take 8 mg by mouth daily.    . fluticasone (FLONASE) 50 MCG/ACT nasal spray Place 1 spray into both nostrils daily as needed for allergies.     . folic acid (FOLVITE) 1 MG tablet Take 1 mg by mouth daily.    . mercaptopurine (PURINETHOL) 50 MG tablet Take 50 mg by mouth daily. Give on an empty stomach 1 hour before or  2 hours after meals. Caution: Chemotherapy.    . mesalamine (APRISO) 0.375 G 24 hr capsule Take 1,500 mg by mouth daily.     Vladimir Faster Glycol-Propyl Glycol (SYSTANE OP) Place 2 drops into both eyes 4 (four) times daily as needed (dry eyes).    . Probiotic Product (PROBIOTIC DAILY PO) Take 1 tablet by mouth daily. "ULTRAFLORA BRAND"    . cholecalciferol (VITAMIN D) 1000 UNITS tablet Take 1,000 Units by mouth daily.      Psychiatric Specialty Exam:     Blood pressure 100/49, pulse 83, temperature 97.6 F (36.4 C), temperature source Oral, resp. rate 18, height _0  (1.575 m), weight 63.866 kg (140 lb 12.8 oz), SpO2 99 %.Body mass index is 25.75 kg/(m^2).  General Appearance: Casual  Eye Contact::  Good  Speech:  Normal Rate  Volume:  Normal  Mood:  confused, disorganized  Affect:  Congruent, Tearful and confused, disorganized  Thought Process:  Disorganized, Irrelevant, Loose and Tangential  Orientation:  Full (Time, Place, and Person)  Thought Content:  flight of ideas, jumping from one topic to another  Suicidal Thoughts:  No  Homicidal Thoughts:  No  Memory:  Immediate;   Poor Recent;   Poor Remote;   Poor  Judgement:  Impaired  Insight:  Lacking  Psychomotor Activity:  Normal  Concentration:  Fair   Recall:  Poor  Akathisia:  NA  Handed:  Right  AIMS (if indicated):     Assets:  Desire for Improvement  Sleep:      Treatment Plan Summary:  Both Drs Reather Converse and Alecea Trego agrees that patient need inpatient Psychiatric hospitalization. Admit to inpatient Psychiatric hospital for safety and stabilization.  Disposition: Admission to Psychiatric hospital Disposition Initial Assessment Completed for this Encounter: Yes Disposition of Patient: Inpatient treatment program Type of inpatient treatment program: Adult  Delfin Gant    PMHNP-BC 09/03/2014 10:53 AM   Patient case  And disposition  Reviewed  with me as above

## 2014-09-03 NOTE — ED Notes (Signed)
Dr Doy Mince aware pt's BP 86/46.

## 2014-09-03 NOTE — ED Notes (Signed)
Portable Chest Xray at the bedside.

## 2014-09-03 NOTE — ED Notes (Signed)
MD at the bedside  

## 2014-09-03 NOTE — ED Notes (Signed)
[facility] called to advise they are unable to accept pt on psych unit d/t they feel pt's condition is medical. Frankey Poot, SW, aware.

## 2014-09-03 NOTE — ED Notes (Signed)
Change in time, back 1 hour, time is 0110 again in same 12 hour shift.

## 2014-09-03 NOTE — ED Notes (Signed)
Per Reginold Agent, NP, [facility], called pt's spouse and requested for him to return at 37 so Telepsych may be performed d/t she is not able to comprehend pt d/t psychosis. Spouse advised he will be here.

## 2014-09-03 NOTE — ED Provider Notes (Signed)
Called to bedside secondary to persistent hypotension. By chart review, hypotension seems to occur after getting medicated with Ativan and Geodon.on reevaluation, patient is sleeping comfortably, but arousable. When aroused she is alert and oriented to person place and time, although not to situation. She is nontoxic and not ill-appearing. Will repeat basic lab work and screening chest x-ray and EKG.  Repeat testing unremarkable.  BPs improved.  I suspect hypotension secondary to medication administration.     Artis Delay, MD 09/04/14 [phone]

## 2014-09-03 NOTE — ED Notes (Signed)
Family at the bedside.

## 2014-09-03 NOTE — ED Notes (Signed)
See downtime charting.

## 2014-09-03 NOTE — ED Notes (Signed)
Called Dr. Wilson Singer to report BP of 85/44, patient now cooperative, no complaints of dizziness, but is "sleepy".  MD gives verbal order for 500 mL bolus.

## 2014-09-03 NOTE — BH Assessment (Addendum)
[facility] Assessment Progress Note The following gero facilities were contacted by this clinician in an effort to place the pt:  Referral Faxed for Review [facility] - no answer, but referral faxed for review  At [facility] per Isaias Cowman per [place] per [facility] [place] per Cyndie Mull per [place]  TTS will continue to seek placement for the pt.  Shaune Pascal, MS, [facility] Licensed Professional Counselor Therapeutic Triage Specialist [facility] Phone: [phone] Fax: [phone]

## 2014-09-04 LAB — URINALYSIS, ROUTINE W REFLEX MICROSCOPIC
Bilirubin Urine: NEGATIVE
GLUCOSE, UA: NEGATIVE mg/dL
Hgb urine dipstick: NEGATIVE
Ketones, ur: NEGATIVE mg/dL
LEUKOCYTES UA: NEGATIVE
Nitrite: NEGATIVE
PH: 7.5 (ref 5.0–8.0)
Protein, ur: NEGATIVE mg/dL
Specific Gravity, Urine: 1.014 (ref 1.005–1.030)
Urobilinogen, UA: 0.2 mg/dL (ref 0.0–1.0)

## 2014-09-04 NOTE — ED Notes (Signed)
Pt speaking in word salad at this time to RN, loose associations. Continues to speak when no one is present in room.

## 2014-09-04 NOTE — ED Notes (Signed)
Family at bedside encouraging pt to eat lunch.

## 2014-09-04 NOTE — Progress Notes (Signed)
SW received call from [facility] as a possible placement. Dr. Ova Freshwater of [facility] reported that they will not accept pt as a psychiatric admit because she feels it is more of a medical issue. Information shared with EDP, [place] who reported pt has had an extensive medical work up and nothing was found to warrant a medical  admission. Pt.'s information faxed to [facility], again for review.  Charlene Brooke, MSW l Social Worker [phone]

## 2014-09-04 NOTE — ED Notes (Signed)
Pt in bathroom, urinated all over the floor. Pt cleaned up, escorted back to room.

## 2014-09-04 NOTE — ED Notes (Signed)
Pt sat down on bedside commode and used the restroom while scrubs were on. Scrubs changed and patient cleaned.

## 2014-09-04 NOTE — ED Notes (Addendum)
Pt continues to be awake and talk actively, when taking VS, pt states, "I think I'm gonna die in about one minute. I'm going to donate my body to science. I've got to be completely naked when they come to take me to [facility] off of [address], my husband died there of diabetic ketoacidosis. I'm going to [place] as soon as I die at 6:01. I need you to notarize this piece of paper, take my temperature to prove I'm dead." Pt not wearing pants or underwear at this time, sitting up in bed, alert to self.

## 2014-09-04 NOTE — ED Notes (Signed)
Daughter called and updated her on plan of care. Pt given info for [facility].

## 2014-09-04 NOTE — ED Notes (Addendum)
Pt tearful at bedside. No thoughts or plan of harming herself or others.

## 2014-09-04 NOTE — ED Notes (Signed)
Pt ambulates to bedside commode. Sitting with patient within arms reach

## 2014-09-04 NOTE — ED Notes (Signed)
Pt in room making repetitive statements, "it all started in two 'o' 'o' eight," "In Jesus' sweet name I say, my poor sweet baby son-in-law," "Truman Hayward, I want you to pick up [name] very careful so she doesn't hurt herself in the hospital," "let's change to all-caps," "Next line, next line, space, capital L E E..." "anybody, find some help in this hospital."

## 2014-09-04 NOTE — ED Notes (Addendum)
Husband assisting pt with a shower

## 2014-09-04 NOTE — Progress Notes (Signed)
Pt assessed and meets inpatient criteria for psychiatric treatment. Pt.'s clinicals faxed out to  [facility] but information faxed [facility] per [place]  [place]  Will continue to pursue placement.  Charlene Brooke, MSW l Social Worker [phone]

## 2014-09-04 NOTE — ED Notes (Signed)
Family at bedside. 

## 2014-09-04 NOTE — ED Notes (Signed)
Sitter at the bedside. Pt sitting in bed calm. Pt briefs are dry.

## 2014-09-04 NOTE — ED Notes (Signed)
Pt called out, states she urinated on herself. Bed soaked, linens and paper scrubs changed

## 2014-09-04 NOTE — ED Notes (Signed)
Pt given meal. Assisted to eat by sitter.

## 2014-09-04 NOTE — ED Notes (Signed)
Attempted to call for safety sitter. No sitter available at this time. Will use staff.

## 2014-09-04 NOTE — ED Provider Notes (Signed)
BP 133/93 mmHg  Pulse 120  Temp(Src) 98.9 F (37.2 C) (Oral)  Resp 18  Ht 5' 2"  (1.575 m)  Wt 140 lb 12.8 oz (63.866 kg)  BMI 25.75 kg/m2  SpO2 95% Pt sitting up, no distress, appears to be watching TV  Sharyon Cable, MD 09/04/14 [phone]

## 2014-09-05 ENCOUNTER — Inpatient Hospital Stay (HOSPITAL_COMMUNITY)
Admission: AD | Admit: 2014-09-05 | Discharge: 2014-09-06 | DRG: 885 | Disposition: A | Discharge status: Other Acute Inpatient Hospital | Payer: 59 | Source: Intra-hospital | Attending: Internal Medicine | Admitting: Internal Medicine

## 2014-09-05 ENCOUNTER — Other Ambulatory Visit: Payer: 59

## 2014-09-05 ENCOUNTER — Encounter (HOSPITAL_COMMUNITY): Payer: Self-pay

## 2014-09-05 DIAGNOSIS — M810 Age-related osteoporosis without current pathological fracture: Secondary | ICD-10-CM | POA: Diagnosis present

## 2014-09-05 DIAGNOSIS — F29 Unspecified psychosis not due to a substance or known physiological condition: Secondary | ICD-10-CM | POA: Diagnosis present

## 2014-09-05 DIAGNOSIS — F05 Delirium due to known physiological condition: Secondary | ICD-10-CM | POA: Diagnosis present

## 2014-09-05 DIAGNOSIS — M1990 Unspecified osteoarthritis, unspecified site: Secondary | ICD-10-CM | POA: Diagnosis present

## 2014-09-05 DIAGNOSIS — Z87891 Personal history of nicotine dependence: Secondary | ICD-10-CM

## 2014-09-05 DIAGNOSIS — G40309 Generalized idiopathic epilepsy and epileptic syndromes, not intractable, without status epilepticus: Secondary | ICD-10-CM | POA: Diagnosis present

## 2014-09-05 DIAGNOSIS — T380X5A Adverse effect of glucocorticoids and synthetic analogues, initial encounter: Secondary | ICD-10-CM | POA: Diagnosis present

## 2014-09-05 DIAGNOSIS — R569 Unspecified convulsions: Secondary | ICD-10-CM | POA: Diagnosis not present

## 2014-09-05 HISTORY — DX: Crohn's disease, unspecified, without complications: K50.90

## 2014-09-05 HISTORY — DX: Personal history of other diseases of urinary system: Z87.448

## 2014-09-05 MED ORDER — DIPHENHYDRAMINE HCL 50 MG/ML IJ SOLN: 50.0000 mg | Freq: Four times a day (QID) | INTRAMUSCULAR | Status: DC | PRN

## 2014-09-05 MED ORDER — ZIPRASIDONE MESYLATE 20 MG IM SOLR
10.0000 mg | Freq: Two times a day (BID) | INTRAMUSCULAR | Status: DC | PRN
  Administered 2014-09-05: 10 mg via INTRAMUSCULAR

## 2014-09-05 MED ORDER — TRAZODONE HCL 50 MG PO TABS
50.0000 mg | ORAL_TABLET | Freq: Every evening | ORAL | Status: DC | PRN
  Filled 2014-09-05 (×7): qty 1

## 2014-09-05 MED ORDER — BENZTROPINE MESYLATE 0.5 MG PO TABS: 0.5000 mg | ORAL_TABLET | Freq: Four times a day (QID) | ORAL | Status: DC | PRN

## 2014-09-05 MED ORDER — ZIPRASIDONE MESYLATE 20 MG IM SOLR: 10.0000 mg | INTRAMUSCULAR | Status: DC | PRN

## 2014-09-05 MED ORDER — HALOPERIDOL 5 MG PO TABS
5.0000 mg | ORAL_TABLET | Freq: Four times a day (QID) | ORAL | Status: DC | PRN
  Filled 2014-09-05: qty 1

## 2014-09-05 MED ORDER — MAGNESIUM HYDROXIDE 400 MG/5ML PO SUSP: 30.0000 mL | Freq: Every day | ORAL | Status: DC | PRN

## 2014-09-05 MED ORDER — OLANZAPINE 5 MG PO TBDP: 5.0000 mg | ORAL_TABLET | Freq: Four times a day (QID) | ORAL | Status: DC | PRN

## 2014-09-05 MED ORDER — ZIPRASIDONE MESYLATE 20 MG IM SOLR
INTRAMUSCULAR | Status: AC
  Administered 2014-09-05: 10 mg via INTRAMUSCULAR
  Filled 2014-09-05: qty 20

## 2014-09-05 MED ORDER — DIPHENHYDRAMINE HCL 50 MG/ML IJ SOLN
INTRAMUSCULAR | Status: AC
  Administered 2014-09-05: 50 mg via INTRAMUSCULAR
  Filled 2014-09-05: qty 1

## 2014-09-05 MED ORDER — ZIPRASIDONE MESYLATE 20 MG IM SOLR: 10.0000 mg | Freq: Once | INTRAMUSCULAR | Status: DC

## 2014-09-05 MED ORDER — DIPHENHYDRAMINE HCL 50 MG/ML IJ SOLN
50.0000 mg | Freq: Once | INTRAMUSCULAR | Status: AC
  Administered 2014-09-05: 50 mg via INTRAMUSCULAR
  Filled 2014-09-05: qty 1

## 2014-09-05 MED ORDER — ACETAMINOPHEN 325 MG PO TABS: 650.0000 mg | ORAL_TABLET | Freq: Four times a day (QID) | ORAL | Status: DC | PRN

## 2014-09-05 MED ORDER — OLANZAPINE 10 MG PO TBDP
10.0000 mg | ORAL_TABLET | Freq: Two times a day (BID) | ORAL | Status: DC | PRN
  Filled 2014-09-05: qty 1

## 2014-09-05 MED ORDER — PROPRANOLOL HCL 20 MG PO TABS
20.0000 mg | ORAL_TABLET | Freq: Two times a day (BID) | ORAL | Status: DC
  Filled 2014-09-05 (×6): qty 1

## 2014-09-05 MED ORDER — STERILE WATER FOR INJECTION IJ SOLN
INTRAMUSCULAR | Status: AC
  Administered 2014-09-05: 10 mL
  Filled 2014-09-05: qty 10

## 2014-09-05 MED ORDER — HALOPERIDOL LACTATE 5 MG/ML IJ SOLN
5.0000 mg | Freq: Four times a day (QID) | INTRAMUSCULAR | Status: DC | PRN
  Administered 2014-09-05 (×2): 5 mg via INTRAMUSCULAR
  Filled 2014-09-05 (×2): qty 1

## 2014-09-05 MED ORDER — OLANZAPINE 10 MG PO TBDP: 10.0000 mg | ORAL_TABLET | Freq: Two times a day (BID) | ORAL | Status: DC | PRN

## 2014-09-05 MED ORDER — ZIPRASIDONE MESYLATE 20 MG IM SOLR
10.0000 mg | Freq: Once | INTRAMUSCULAR | Status: AC
  Administered 2014-09-05: 10 mg via INTRAMUSCULAR
  Filled 2014-09-05: qty 20

## 2014-09-05 MED ORDER — BENZTROPINE MESYLATE 1 MG/ML IJ SOLN
0.5000 mg | Freq: Four times a day (QID) | INTRAMUSCULAR | Status: DC | PRN
  Filled 2014-09-05: qty 2

## 2014-09-05 NOTE — Consult Note (Signed)
Diane [name] Face to Face Psychiatric Consult   Reason for Consult: steroid induced psychosis Referring Physician:  EDP Diane [name] is an 63 y.o. female.  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 History of small bowel obstruction     2011--  [facility] --  RESOLVED -- NO SURGICAL INTERVENTION  . SUI (stress urinary incontinence, female)   . Pelvic prolapse   . Osteoporosis   . History of endometriosis   . PONV (postoperative nausea and vomiting)     AND HARD TO WAKE  . Crohn's disease   . Allergic rhinitis   . Arthritis    AXIS IV:  other psychosocial or environmental problems and problems related to social environment AXIS V:  31-40 impairment in reality testing  Plan:  Start Geodon 10 mg IM Q2 hours (maximum two doses in 24 hours) May use Benadrul 50 mg PO /IM Q6H PRN for agitation Start Zyprexa Zydis 10 mg PO BID if cooperative Recommend psychiatric Inpatient admission when medically cleared.  Appreciate psychiatric consultation Please contact [phone] or [phone] if needs further assistance   Subjective:   Diane [name] is a 63 y.o. female patient admitted with manic symptoms.  HPI:  Diane [name] is a 63 years old female seen for psychiatric consultation in [facility] ([place]). She is confused, restless, insomnia, disorganized, racing thought, FOI, refusing oral medications, no focus and easily getting distracted, paranoid and calling out for help saying she is in danger. Patient has been suffering with crohn's disease and recently received prednisone treatment for about two weeks and her behaviors changed and did not stop even after she completed steroid therapy. She needed Geodon, and Ativan IM medication to calm her down in ER at least twice.   She was recently admitted and treated for confusion, fall and was sent home on the 28th of this month after she was stabilized. Her husband  reported that during her initial admission last week she fell in the bathroom and CT Scan was done which came back WNL.  Patient's husband reported that she has never been diagnosed with any Mental illness and is not taking any Psychotropic medications. We have accepted patient for admission to an inpatient Psychiatric unit.  Patient could not answer questions regarding her safety and family denies any previous attempt to hurt self.  She denies hearing voices or seeing things.  HPI Elements:   Location:  Substance induced mood disorder, Psychosis. Quality:  severe, disorganized, confused. Severity:  severe. Timing:  acute, on going. Duration:  about a month. Context:  Questionable medication induced, new onset Psychosis .  Past Psychiatric History: Past Medical History  Diagnosis Date  . History of small bowel obstruction     2011--  [facility] --  RESOLVED -- NO SURGICAL INTERVENTION  . SUI (stress urinary incontinence, female)   . Pelvic prolapse   . Osteoporosis   . History of endometriosis   . PONV (postoperative nausea and vomiting)     AND HARD TO WAKE  . Crohn's disease   . Allergic rhinitis   . Arthritis     reports that she has quit smoking. Her smoking use included Cigarettes. She has a 30 pack-year smoking history. She has never used smokeless tobacco. She reports that she drinks about 4.2 oz of alcohol per week. She reports that she does not use illicit drugs. History reviewed. No pertinent  family history. Family History Substance Abuse: Yes, Describe: Family Supports: Yes, List: (Husband ) Living Arrangements: Spouse/significant other Can pt return to current living arrangement?: Yes Allergies:   Allergies  Allergen Reactions  . Celebrex [Celecoxib] Anaphylaxis  . Actonel [Risedronate Sodium] Other (See Comments)    GI UPSET  . Boniva [Ibandronic Acid] Other (See Comments)    GI UPSET  . Chantix [Varenicline] Other (See Comments)    MOOD CHANGE  . Fosamax  [Alendronate Sodium] Other (See Comments)    GI UPSET  . Neosporin [Neomycin-Bacitracin Zn-Polymyx] Swelling and Other (See Comments)    REDNESS  . Nitrofuran Derivatives Other (See Comments)    CHILLS AND FEVER  . Tobradex [Tobramycin-Dexamethasone] Other (See Comments)    SEVERE EYE IRRIATION    ACT Assessment Complete:  Yes:    Educational Status    Risk to Self: Risk to self with the past 6 months Suicidal Ideation: No Suicidal Intent: No Is patient at risk for suicide?: No Suicidal Plan?: No Access to Means: No What has been your use of drugs/alcohol within the last 12 months?: No alcohol or drug use reported. Previous Attempts/Gestures: No How many times?: 0 Other Self Harm Risks: No other self harm risk identified  Triggers for Past Attempts: None known Intentional Self Injurious Behavior: None Family Suicide History: No Recent stressful life event(s):  (No stressful events reported. ) Persecutory voices/beliefs?: No Depression: Yes Depression Symptoms: Despondent, Insomnia, Tearfulness, Guilt, Fatigue, Loss of interest in usual pleasures, Feeling worthless/self pity, Feeling angry/irritable Substance abuse history and/or treatment for substance abuse?: No Suicide prevention information given to non-admitted patients: Not applicable  Risk to Others: Risk to Others within the past 6 months Homicidal Ideation: No Thoughts of Harm to Others: No Current Homicidal Intent: No Current Homicidal Plan: No Access to Homicidal Means: No Identified Victim: N/A History of harm to others?: No Assessment of Violence: None Noted Violent Behavior Description: No violent behaviors observed. Pt is calm and cooperative at this time.  Does patient have access to weapons?: No Criminal Charges Pending?: No Does patient have a court date: No  Abuse: Abuse/Neglect Assessment (Assessment to be complete while patient is alone) Physical Abuse: Denies Verbal Abuse: Denies Sexual Abuse:  Denies Exploitation of patient/patient's resources: Denies Self-Neglect: Denies  Prior Inpatient Therapy: Prior Inpatient Therapy Prior Inpatient Therapy: No  Prior Outpatient Therapy: Prior Outpatient Therapy Prior Outpatient Therapy: No  Additional Information: Additional Information 1:1 In Past 12 Months?: No CIRT Risk: No Elopement Risk: No Does patient have medical clearance?: Yes    Objective: Blood pressure 142/76, pulse 114, temperature 97.4 F (36.3 C), temperature source Oral, resp. rate 15, height 5' 2"  (1.575 m), weight 63.866 kg (140 lb 12.8 oz), SpO2 97 %.Body mass index is 25.75 kg/(m^2). Results for orders placed or performed during the hospital encounter of 09/01/14 (from the past 72 hour(s))  Basic metabolic panel     Status: Abnormal   Collection Time: 09/02/14  2:57 PM  Result Value Ref Range   Sodium 139 137 - 147 mEq/L   Potassium 3.9 3.7 - 5.3 mEq/L   Chloride 104 96 - 112 mEq/L   CO2 24 19 - 32 mEq/L   Glucose, Bld 101 (H) 70 - 99 mg/dL   BUN 7 6 - 23 mg/dL   Creatinine, Ser 0.78 0.50 - 1.10 mg/dL   Calcium 9.2 8.4 - 10.5 mg/dL   GFR calc non Af Amer 87 (L) >90 mL/min   GFR calc Af Amer >90 >  90 mL/min    Comment: (NOTE) The eGFR has been calculated using the CKD EPI equation. This calculation has not been validated in all clinical situations. eGFR's persistently <90 mL/min signify possible Chronic Kidney Disease.   Anion gap 11 5 - 15  CBC with Differential     Status: Abnormal   Collection Time: 09/02/14  2:57 PM  Result Value Ref Range   WBC 7.9 4.0 - 10.5 K/uL   RBC 4.07 3.87 - 5.11 MIL/uL   Hemoglobin 11.7 (L) 12.0 - 15.0 g/dL   HCT 36.3 36.0 - 46.0 %   MCV 89.2 78.0 - 100.0 fL   MCH 28.7 26.0 - 34.0 pg   MCHC 32.2 30.0 - 36.0 g/dL   RDW 16.6 (H) 11.5 - 15.5 %   Platelets 298 150 - 400 K/uL   Neutrophils Relative % 76 43 - 77 %   Neutro Abs 6.0 1.7 - 7.7 K/uL   Lymphocytes Relative 15 12 - 46 %   Lymphs Abs 1.2 0.7 - 4.0 K/uL    Monocytes Relative 8 3 - 12 %   Monocytes Absolute 0.7 0.1 - 1.0 K/uL   Eosinophils Relative 1 0 - 5 %   Eosinophils Absolute 0.1 0.0 - 0.7 K/uL   Basophils Relative 0 0 - 1 %   Basophils Absolute 0.0 0.0 - 0.1 K/uL  CBC with Differential     Status: Abnormal   Collection Time: 09/03/14  6:24 PM  Result Value Ref Range   WBC 7.5 4.0 - 10.5 K/uL   RBC 4.12 3.87 - 5.11 MIL/uL   Hemoglobin 12.0 12.0 - 15.0 g/dL   HCT 38.6 36.0 - 46.0 %   MCV 93.7 78.0 - 100.0 fL   MCH 29.1 26.0 - 34.0 pg   MCHC 31.1 30.0 - 36.0 g/dL   RDW 16.7 (H) 11.5 - 15.5 %   Platelets 259 150 - 400 K/uL   Neutrophils Relative % 69 43 - 77 %   Neutro Abs 5.2 1.7 - 7.7 K/uL   Lymphocytes Relative 22 12 - 46 %   Lymphs Abs 1.6 0.7 - 4.0 K/uL   Monocytes Relative 7 3 - 12 %   Monocytes Absolute 0.6 0.1 - 1.0 K/uL   Eosinophils Relative 2 0 - 5 %   Eosinophils Absolute 0.2 0.0 - 0.7 K/uL   Basophils Relative 0 0 - 1 %   Basophils Absolute 0.0 0.0 - 0.1 K/uL  Basic metabolic panel     Status: None   Collection Time: 09/03/14  6:24 PM  Result Value Ref Range   Sodium 145 137 - 147 mEq/L   Potassium 4.0 3.7 - 5.3 mEq/L   Chloride 111 96 - 112 mEq/L   CO2 21 19 - 32 mEq/L   Glucose, Bld 84 70 - 99 mg/dL   BUN 7 6 - 23 mg/dL   Creatinine, Ser 0.55 0.50 - 1.10 mg/dL   Calcium 8.4 8.4 - 10.5 mg/dL   GFR calc non Af Amer >90 >90 mL/min   GFR calc Af Amer >90 >90 mL/min    Comment: (NOTE) The eGFR has been calculated using the CKD EPI equation. This calculation has not been validated in all clinical situations. eGFR's persistently <90 mL/min signify possible Chronic Kidney Disease.    Anion gap 13 5 - 15  Troponin I     Status: None   Collection Time: 09/03/14  6:24 PM  Result Value Ref Range   Troponin I <0.30 <0.30 ng/mL  Comment:        Due to the release kinetics of cTnI, a negative result within the first hours of the onset of symptoms does not rule out myocardial infarction with certainty. If  myocardial infarction is still suspected, repeat the test at appropriate intervals.   Urinalysis, Routine w reflex microscopic     Status: None   Collection Time: 09/04/14  9:29 PM  Result Value Ref Range   Color, Urine YELLOW YELLOW   APPearance CLEAR CLEAR   Specific Gravity, Urine 1.014 1.005 - 1.030   pH 7.5 5.0 - 8.0   Glucose, UA NEGATIVE NEGATIVE mg/dL   Hgb urine dipstick NEGATIVE NEGATIVE   Bilirubin Urine NEGATIVE NEGATIVE   Ketones, ur NEGATIVE NEGATIVE mg/dL   Protein, ur NEGATIVE NEGATIVE mg/dL   Urobilinogen, UA 0.2 0.0 - 1.0 mg/dL   Nitrite NEGATIVE NEGATIVE   Leukocytes, UA NEGATIVE NEGATIVE    Comment: MICROSCOPIC NOT DONE ON URINES WITH NEGATIVE PROTEIN, BLOOD, LEUKOCYTES, NITRITE, OR GLUCOSE <1000 mg/dL.   Labs are reviewed and are pertinent for unremarkable  Current Facility-Administered Medications  Medication Dose Route Frequency Provider Last Rate Last Dose  . acetaminophen (TYLENOL) tablet 650 mg  650 mg Oral Q4H PRN Orpah Greek, MD   650 mg at 09/01/14 1533  . mercaptopurine (PURINETHOL) tablet 50 mg  50 mg Oral Daily Orpah Greek, MD   50 mg at 09/05/14 0959  . Mesalamine (ASACOL) DR capsule 1,600 mg  1,600 mg Oral Rodney Booze Orpah Greek, MD   1,600 mg at 09/05/14 0653  . ondansetron (ZOFRAN) tablet 4 mg  4 mg Oral Q8H PRN Orpah Greek, MD       Current Outpatient Prescriptions  Medication Sig Dispense Refill  . Calcium Carb-Cholecalciferol (CALCIUM + D3 PO) Take 2 tablets by mouth daily.    . cetirizine (ZYRTEC) 10 MG tablet Take 10 mg by mouth daily as needed for allergies.    . fesoterodine (TOVIAZ) 8 MG TB24 tablet Take 8 mg by mouth daily.    . fluticasone (FLONASE) 50 MCG/ACT nasal spray Place 1 spray into both nostrils daily as needed for allergies.     . folic acid (FOLVITE) 1 MG tablet Take 1 mg by mouth daily.    . mercaptopurine (PURINETHOL) 50 MG tablet Take 50 mg by mouth daily. Give on an empty stomach 1  hour before or 2 hours after meals. Caution: Chemotherapy.    . mesalamine (APRISO) 0.375 G 24 hr capsule Take 1,500 mg by mouth daily.     Vladimir Faster Glycol-Propyl Glycol (SYSTANE OP) Place 2 drops into both eyes 4 (four) times daily as needed (dry eyes).    . Probiotic Product (PROBIOTIC DAILY PO) Take 1 tablet by mouth daily. "ULTRAFLORA BRAND"    . cholecalciferol (VITAMIN D) 1000 UNITS tablet Take 1,000 Units by mouth daily.      Psychiatric Specialty Exam:     Blood pressure 142/76, pulse 114, temperature 97.4 F (36.3 C), temperature source Oral, resp. rate 15, height 5' 2"  (1.575 m), weight 63.866 kg (140 lb 12.8 oz), SpO2 97 %.Body mass index is 25.75 kg/(m^2).  General Appearance: Casual  Eye Contact::  Good  Speech:  Normal Rate  Volume:  Normal  Mood:  confused, disorganized  Affect:  Congruent, Tearful and confused, disorganized  Thought Process:  Disorganized, Irrelevant, Loose and Tangential  Orientation:  Full (Time, Place, and Person)  Thought Content:  flight of ideas, jumping from one topic to  another  Suicidal Thoughts:  No  Homicidal Thoughts:  No  Memory:  Immediate;   Poor Recent;   Poor Remote;   Poor  Judgement:  Impaired  Insight:  Lacking  Psychomotor Activity:  Normal  Concentration:  Fair  Recall:  Poor  Akathisia:  NA  Handed:  Right  AIMS (if indicated):     Assets:  Desire for Improvement  Sleep:      Treatment Plan Summary:   Admit to inpatient Psychiatric hospital for safety and stabilization.  Disposition:  Recommend Admission to Psychiatric hospital Start Geodon 10 mg IM Q2 hours (maximum two doses in 24 hours) May use Benadrul 50 mg PO /IM Q6H PRN for agitation Start Zyprexa Zydis 10 mg PO BID if cooperative    Jerimey Burridge,JANARDHAHA R.    09/05/2014 10:42 AM

## 2014-09-05 NOTE — ED Notes (Signed)
Dr Vanita Panda advised to send pt [facility].

## 2014-09-05 NOTE — ED Notes (Signed)
Dr Omer Jack in w/pt.

## 2014-09-05 NOTE — Tx Team (Signed)
Initial Interdisciplinary Treatment Plan   PATIENT STRESSORS: Health problems Medication change or noncompliance   PROBLEM LIST: Problem List/Patient Goals Date to be addressed Date deferred Reason deferred Estimated date of resolution  Psychosis 09/05/14     Delusions 09/05/14     Risk for suicide 09/05/14                                          DISCHARGE CRITERIA:  Improved stabilization in mood, thinking, and/or behavior Medical problems require only outpatient monitoring Reduction of life-threatening or endangering symptoms to within safe limits  PRELIMINARY DISCHARGE PLAN: Attend aftercare/continuing care group Outpatient therapy  PATIENT/FAMIILY INVOLVEMENT: This treatment plan has been presented to and reviewed with the patient, KIERRAH KILBRIDE.  The patient and family have been given the opportunity to ask questions and make suggestions.  Vonzella Nipple A 09/05/2014, 9:21 PM

## 2014-09-05 NOTE — Clinical Social Work Note (Signed)
Clinical Social Worker spoke with disposition social worker at [facility] who states that they continue to search for inpatient psychiatric treatment.  CSW inquired about possible placement at [facility] - disposition social worker working with Dr. Dwyane Dee to determine if appropriate discharge disposition.  CSW to follow up with patient family once acceptance and/or denial.  Evening CSW has attempted to contact patient husband over the phone and left a message for return call for update.  CSW remains available for support as needed.  Barbette Or, [place]

## 2014-09-05 NOTE — Progress Notes (Signed)
Patient ID: Diane [name], female   DOB: 29-Oct-1951, 63 y.o.   MRN: [id number]  Pt was brought in on the previous shift. The admission was finished by Probation officer as much as possible.     Admission Note:  D:63 yr female who presents IVC in no acute distress for the treatment of Prednisone induced Psychosis. Pt appears delusional, flight of ideas, tangential, sarcastic and disorganized. Pt was calm and cooperative with admission process. Pt appeared agitated at time , even trying to walk out while Probation officer was asking questions.  Pt continued to believe she was at [facility] hospital even after writer oriented pt to her specific location numerous times. 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 Pt denies SI/HI/AVH at this time. Pt was +ve for AH- stating she was seeing needles on the floor in her room, and also stated that various family members were in the hallway, even after she was informed that her family members were gone because visiting hours were over. We tried to get the pt  vital signs,  But the pt ripped the cuff off and began to babble incoharantly.   A: Food and fluids offered, and rejected. 15 min checks for safety continued.   R:Pt had no additional questions or concerns.

## 2014-09-05 NOTE — ED Provider Notes (Signed)
Patient accepted by Dr. Louretta Shorten to [facility]. Pt is currently up and ambulating. Says she is looking for her glasses.  EMTALA done. Mild tachycardia present as it as been through out her stay.  Filed Vitals:   09/05/14 0548  BP: 142/76  Pulse: 114  Temp: 97.4 F (36.3 C)  Resp: [address]     Linus Mako, PA-C 09/05/14 1123

## 2014-09-05 NOTE — Progress Notes (Signed)
Pt has been accepted at [facility], room 501-2, accepting physician Dr. Janina Mayo. Pod C RN and pt.'s husband made aware. Pt to be IVC'd. Will await transport.  Charlene Brooke, MSW Social Worker [phone]

## 2014-09-05 NOTE — ED Notes (Signed)
Per Dr Vanita Panda, no restraint order is needed. Geodon given for psychotic reasons. Zandra Abts, Agricultural consultant, aware.

## 2014-09-05 NOTE — Progress Notes (Signed)
RN 1:1 Note  D: Patient is currently resting in the bed under the blankets  A: Continue 1:1 observation at this time  R: Patient's respirations are even and unlabored; patient has no acute needs at this time

## 2014-09-05 NOTE — Progress Notes (Signed)
Pt pacing the halls, pt yelling , pt stood at the end of the hall and urinated on the floor.

## 2014-09-05 NOTE — Progress Notes (Signed)
Unable to do most of the admission due to psychosis

## 2014-09-05 NOTE — ED Notes (Addendum)
Pt was awake the entire night; occasionally calling out to her mom; Pt was referring to herself in 3rd person; pt repeatedly called her own name out and tells herself to shut up; pt tosses body in all directions on the bed; pt was aware of self but believes she is at "a prison farm". Pt able to state DOB and age correctly; pt states she got here by her son. Pt is calm and coroperative but very restless. Sitter remains at bedsides.

## 2014-09-05 NOTE — Progress Notes (Addendum)
Pt continues to be delusional. Pt , per MHT was talking to her relatives. Pt making statements that do not relate to current situation. Pt continues to insist she is in [employer]. Pt continues to make refernces to the police and untrue statements "I'm not in [place], I'm in [place], I am not Involuntary committed" . Pt refused Trazodone, but pt was told if she continued to make noise loud enough to wake up other patients she would be given medication.

## 2014-09-05 NOTE — Tx Team (Signed)
  Interdisciplinary Treatment Plan Update   Date Reviewed:  09/05/2014  Time Reviewed:  12:50 PM  Progress in Treatment:   Attending groups: No Participating in groups: No Taking medication as prescribed: No  Tolerating medication: Yes Family/Significant other contact made: No Patient understands diagnosis: No  Substance induced psychosis [floridly]  Discussing patient identified problems/goals with staff: Yes  See initial care plan Medical problems stabilized or resolved: Yes Denies suicidal/homicidal ideation: Yes  In tx team Patient has not harmed self or others: Yes  For review of initial/current patient goals, please see plan of care.  Estimated Length of Stay:  4-5 days  Reason for Continuation of Hospitalization: Hallucinations Medication stabilization Other; describe Delerium  New Problems/Goals identified:  N/A  Discharge Plan or Barriers:   return home, follow up outpt  Additional Comments:  [name], 63 years old was seen this morning with the husband and daughter at her bedside. Patient is confused, disorganized and could not answer questions without drifting off the topic in discussion. Per Mr Dungee, her husband, she was recently admitted and treated for confusion, fall and was sent home on the 28th of this month after she was stabilized. When she got home the confusion continued with her being unable to sleep. She was brought back on Friday last week for increased confusion and disorganization. Her husband reported that during her initial admission last week she fell in the bathroom and CT Scan was done which came back WNL. Patient's husband reported that she has never been diagnosed with any Mental illness and is not taking any Psychotropic medications. Patient exhibited flight of ideas, jumping from one topic to another. Her husband strongly believe her disorganization and confusion started after she started taking Prednisone.Pt is admitted in a confused  and agitated state.  Given injection and sequestered in quiet room.  Attendees:  Signature: Steva Colder, MD 09/05/2014 12:50 PM   Signature: Ripley Fraise, LCSW 09/05/2014 12:50 PM  Signature: Elmarie Shiley, NP 09/05/2014 12:50 PM  Signature: Gerald Leitz, RN 09/05/2014 12:50 PM  Signature:  09/05/2014 12:50 PM  Signature:  09/05/2014 12:50 PM  Signature:   09/05/2014 12:50 PM  Signature:    Signature:    Signature:    Signature:    Signature:    Signature:      Scribe for Treatment Team:   Ripley Fraise, LCSW  09/05/2014 12:50 PM

## 2014-09-05 NOTE — Progress Notes (Addendum)
Pt had to be escorted to quiet room to be given medication. CIRT package to be done

## 2014-09-05 NOTE — Progress Notes (Addendum)
  Patient was seen face to face today .Patient appeared to be loud ,agitated and yelling initially ,but was found to be calm later on. Patient avoided eye contact is not cooperative, however appears to be more calm. Patient will continue to benefit from seclusion until she is more stable and redirectable. Reviewed previous notes in EHR. Patient with no past psychiatric history,possible prednisone induced psychosis. Reviewed Vital signs, she is tachycardic -will monitor vital signs closely. Please try to avoid medications that can make her disinhibited ,she is elderly ,has a hx of confusion ,no past psychiatric hx per EHR - Try to avoid benadryl/ativan . Reorient patient to her surroundings regularly. Will order labs as needed. Patient will benefit from forced medications if she has severe agitation/anxiety or psychotic sx. Pt will need to be assessed by a 2nd physician for 2 nd opinion.  Ursula Alert ,MD Attending [facility] 09/05/2104 3;17 am

## 2014-09-05 NOTE — ED Notes (Signed)
Pt accepted to [facility] 501-2 - Dr Omer Jack

## 2014-09-05 NOTE — ED Notes (Signed)
Notified Dr Vanita Panda pt refusing to stay in room. Pt confused. Security assisting.

## 2014-09-05 NOTE — Progress Notes (Signed)
Nursing 1:1 note D:Pt observed lying in bed with eyes open. RR even and unlabored. No distress noted.Pt stated she was ok A: 1:1 observation continues for safety  R: pt remains safe

## 2014-09-05 NOTE — Progress Notes (Signed)
Safety Zone was submitted

## 2014-09-05 NOTE — Clinical Social Work Note (Signed)
Clinical Social Worker met with Dr. Omer Jack outside of patient room to complete IVC paperwork - paperwork complete and faxed to magistrate.  CSW spoke with magistrate over the phone who will process paperwork and arrange for papers to be served and transportation to [facility].  Per [facility], patient husband requests notification at time of patient transport.  CSW available as needed.  Barbette Or, [place]

## 2014-09-05 NOTE — Progress Notes (Addendum)
Patient yelling and refusing all initiatives before admission process could begin; unable to assess patient at this time except for skin assessment; patient skin assessment was performed; patient had red bruised areas on bilateral arms and hands; patient had spotted areas on bilateral legs; her back and rectal area was clear; this assessment was done at 1420 hrs; patient is not redirectable at this time; patient brought on to the unit at 1400 hrs and placed in seclusion; patient unable to sign any admission documents at this time due to psychosis; patient was medicated at 1420 hrs with 10 mg of geodon by Hulan Amato RN and 50 mg of benadryl by this Probation officer; patient placed on 1:1 that was started at 1410 hrs

## 2014-09-05 NOTE — Progress Notes (Signed)
The focus of this group is to help patients review their daily goal of treatment and discuss progress on daily workbooks. Pt did not attend the evening group.

## 2014-09-05 NOTE — ED Provider Notes (Addendum)
On morning rounds the patient sitting upright, eating breakfast.  Per report the patient was awake all night.  She is currently in no distress.  Vital signs notable only for mild tachycardia. Patient is awaiting placement.  Carmin Muskrat, MD 09/05/14 0827  11:38 AM Patient re-checked prior to transfer. She is again non-cooperative, aggressive, combative. VS 142/77. Geodon provided - which the patient received two days ago.  Carmin Muskrat, MD 09/05/14 [phone]

## 2014-09-06 ENCOUNTER — Encounter (HOSPITAL_COMMUNITY): Payer: Self-pay | Admitting: *Deleted

## 2014-09-06 ENCOUNTER — Inpatient Hospital Stay (HOSPITAL_COMMUNITY): Payer: 59

## 2014-09-06 ENCOUNTER — Ambulatory Visit: Payer: 59 | Admitting: Diagnostic Neuroimaging

## 2014-09-06 ENCOUNTER — Inpatient Hospital Stay (HOSPITAL_COMMUNITY)
Admission: AD | Admit: 2014-09-06 | Discharge: 2014-09-09 | DRG: 917 | Disposition: A | Discharge status: Home / Self Care | Payer: 59 | Source: Other Acute Inpatient Hospital | Attending: Internal Medicine | Admitting: Internal Medicine

## 2014-09-06 ENCOUNTER — Encounter (HOSPITAL_COMMUNITY): Payer: Self-pay | Admitting: Psychiatry

## 2014-09-06 DIAGNOSIS — M810 Age-related osteoporosis without current pathological fracture: Secondary | ICD-10-CM | POA: Diagnosis present

## 2014-09-06 DIAGNOSIS — F05 Delirium due to known physiological condition: Secondary | ICD-10-CM | POA: Insufficient documentation

## 2014-09-06 DIAGNOSIS — T380X5A Adverse effect of glucocorticoids and synthetic analogues, initial encounter: Secondary | ICD-10-CM | POA: Diagnosis present

## 2014-09-06 DIAGNOSIS — Z87891 Personal history of nicotine dependence: Secondary | ICD-10-CM

## 2014-09-06 DIAGNOSIS — F19951 Other psychoactive substance use, unspecified with psychoactive substance-induced psychotic disorder with hallucinations: Secondary | ICD-10-CM

## 2014-09-06 DIAGNOSIS — G929 Unspecified toxic encephalopathy: Secondary | ICD-10-CM

## 2014-09-06 DIAGNOSIS — R410 Disorientation, unspecified: Secondary | ICD-10-CM

## 2014-09-06 DIAGNOSIS — G92 Toxic encephalopathy: Secondary | ICD-10-CM | POA: Diagnosis present

## 2014-09-06 DIAGNOSIS — R569 Unspecified convulsions: Secondary | ICD-10-CM | POA: Diagnosis not present

## 2014-09-06 DIAGNOSIS — F19959 Other psychoactive substance use, unspecified with psychoactive substance-induced psychotic disorder, unspecified: Secondary | ICD-10-CM | POA: Diagnosis present

## 2014-09-06 DIAGNOSIS — F29 Unspecified psychosis not due to a substance or known physiological condition: Secondary | ICD-10-CM

## 2014-09-06 DIAGNOSIS — K5090 Crohn's disease, unspecified, without complications: Secondary | ICD-10-CM | POA: Diagnosis present

## 2014-09-06 DIAGNOSIS — IMO0002 Reserved for concepts with insufficient information to code with codable children: Secondary | ICD-10-CM | POA: Diagnosis present

## 2014-09-06 DIAGNOSIS — R4182 Altered mental status, unspecified: Secondary | ICD-10-CM | POA: Diagnosis present

## 2014-09-06 DIAGNOSIS — Z87898 Personal history of other specified conditions: Secondary | ICD-10-CM

## 2014-09-06 DIAGNOSIS — Z79899 Other long term (current) drug therapy: Secondary | ICD-10-CM | POA: Diagnosis not present

## 2014-09-06 LAB — COMPREHENSIVE METABOLIC PANEL
ALT: 26 U/L (ref 0–35)
ANION GAP: 15 (ref 5–15)
AST: 27 U/L (ref 0–37)
Albumin: 3.2 g/dL — ABNORMAL LOW (ref 3.5–5.2)
Alkaline Phosphatase: 83 U/L (ref 39–117)
BILIRUBIN TOTAL: 0.5 mg/dL (ref 0.3–1.2)
BUN: 10 mg/dL (ref 6–23)
CHLORIDE: 102 meq/L (ref 96–112)
CO2: 20 meq/L (ref 19–32)
CREATININE: 0.62 mg/dL (ref 0.50–1.10)
Calcium: 8.9 mg/dL (ref 8.4–10.5)
GLUCOSE: 84 mg/dL (ref 70–99)
Potassium: 3.7 mEq/L (ref 3.7–5.3)
Sodium: 137 mEq/L (ref 137–147)
Total Protein: 7 g/dL (ref 6.0–8.3)

## 2014-09-06 LAB — CBC WITH DIFFERENTIAL/PLATELET
BASOS ABS: 0 10*3/uL (ref 0.0–0.1)
Basophils Relative: 0 % (ref 0–1)
Eosinophils Absolute: 0.1 10*3/uL (ref 0.0–0.7)
Eosinophils Relative: 2 % (ref 0–5)
HCT: 35.6 % — ABNORMAL LOW (ref 36.0–46.0)
Hemoglobin: 11.4 g/dL — ABNORMAL LOW (ref 12.0–15.0)
LYMPHS PCT: 16 % (ref 12–46)
Lymphs Abs: 1.2 10*3/uL (ref 0.7–4.0)
MCH: 29.1 pg (ref 26.0–34.0)
MCHC: 32 g/dL (ref 30.0–36.0)
MCV: 90.8 fL (ref 78.0–100.0)
Monocytes Absolute: 0.8 10*3/uL (ref 0.1–1.0)
Monocytes Relative: 11 % (ref 3–12)
NEUTROS PCT: 71 % (ref 43–77)
Neutro Abs: 5.1 10*3/uL (ref 1.7–7.7)
PLATELETS: 359 10*3/uL (ref 150–400)
RBC: 3.92 MIL/uL (ref 3.87–5.11)
RDW: 16.5 % — ABNORMAL HIGH (ref 11.5–15.5)
WBC: 7.2 10*3/uL (ref 4.0–10.5)

## 2014-09-06 LAB — URINALYSIS, ROUTINE W REFLEX MICROSCOPIC
Bilirubin Urine: NEGATIVE
Glucose, UA: NEGATIVE mg/dL
Hgb urine dipstick: NEGATIVE
Ketones, ur: 40 mg/dL — AB
LEUKOCYTES UA: NEGATIVE
NITRITE: NEGATIVE
PH: 6 (ref 5.0–8.0)
PROTEIN: NEGATIVE mg/dL
Specific Gravity, Urine: 1.008 (ref 1.005–1.030)
Urobilinogen, UA: 0.2 mg/dL (ref 0.0–1.0)

## 2014-09-06 MED ORDER — BUDESONIDE 3 MG PO CP24: 3.0000 mg | ORAL_CAPSULE | Freq: Every day | ORAL | Status: DC

## 2014-09-06 MED ORDER — HALOPERIDOL 0.5 MG PO TABS
2.5000 mg | ORAL_TABLET | Freq: Three times a day (TID) | ORAL | Status: DC | PRN
Start: 2014-09-06 — End: 2014-09-06

## 2014-09-06 MED ORDER — ONDANSETRON HCL 4 MG/2ML IJ SOLN: 4.0000 mg | Freq: Four times a day (QID) | INTRAMUSCULAR | Status: DC | PRN

## 2014-09-06 MED ORDER — MESALAMINE ER 0.375 G PO CP24: 1.5000 g | ORAL_CAPSULE | Freq: Every day | ORAL | Status: DC

## 2014-09-06 MED ORDER — BENZTROPINE MESYLATE 1 MG/ML IJ SOLN
0.5000 mg | Freq: Two times a day (BID) | INTRAMUSCULAR | Status: DC
  Filled 2014-09-06 (×4): qty 0.5

## 2014-09-06 MED ORDER — HALOPERIDOL LACTATE 5 MG/ML IJ SOLN
2.0000 mg | Freq: Two times a day (BID) | INTRAMUSCULAR | Status: DC
  Filled 2014-09-06 (×4): qty 0.4

## 2014-09-06 MED ORDER — HALOPERIDOL 2 MG PO TABS
2.0000 mg | ORAL_TABLET | Freq: Two times a day (BID) | ORAL | Status: DC
  Administered 2014-09-06: 2 mg via ORAL
  Filled 2014-09-06 (×5): qty 1

## 2014-09-06 MED ORDER — ACETAMINOPHEN 325 MG PO TABS: 650.0000 mg | ORAL_TABLET | Freq: Four times a day (QID) | ORAL | Status: DC | PRN

## 2014-09-06 MED ORDER — ONDANSETRON HCL 4 MG PO TABS: 4.0000 mg | ORAL_TABLET | Freq: Four times a day (QID) | ORAL | Status: DC | PRN

## 2014-09-06 MED ORDER — BENZTROPINE MESYLATE 1 MG/ML IJ SOLN: 0.5000 mg | Freq: Three times a day (TID) | INTRAMUSCULAR | Status: DC | PRN

## 2014-09-06 MED ORDER — SODIUM CHLORIDE 0.9 % IV SOLN: 250.0000 mL | INTRAVENOUS | Status: DC | PRN

## 2014-09-06 MED ORDER — RISPERIDONE 0.5 MG PO TBDP
0.5000 mg | ORAL_TABLET | Freq: Every day | ORAL | Status: DC
  Administered 2014-09-06: 0.5 mg via ORAL
  Filled 2014-09-06 (×2): qty 1

## 2014-09-06 MED ORDER — HALOPERIDOL LACTATE 5 MG/ML IJ SOLN: 2.5000 mg | Freq: Three times a day (TID) | INTRAMUSCULAR | Status: DC | PRN

## 2014-09-06 MED ORDER — BENZTROPINE MESYLATE 0.5 MG PO TABS
0.5000 mg | ORAL_TABLET | Freq: Three times a day (TID) | ORAL | Status: DC | PRN
  Filled 2014-09-06: qty 1

## 2014-09-06 MED ORDER — FOLIC ACID 1 MG PO TABS
1.0000 mg | ORAL_TABLET | Freq: Every day | ORAL | Status: DC
  Administered 2014-09-07 – 2014-09-09 (×3): 1 mg via ORAL
  Filled 2014-09-06 (×3): qty 1

## 2014-09-06 MED ORDER — BENZTROPINE MESYLATE 0.5 MG PO TABS
0.2500 mg | ORAL_TABLET | Freq: Two times a day (BID) | ORAL | Status: DC
  Administered 2014-09-06: 0.25 mg via ORAL
  Filled 2014-09-06 (×4): qty 1

## 2014-09-06 MED ORDER — SODIUM CHLORIDE 0.9 % IJ SOLN: 3.0000 mL | INTRAMUSCULAR | Status: DC | PRN

## 2014-09-06 MED ORDER — ACETAMINOPHEN 650 MG RE SUPP: 650.0000 mg | Freq: Four times a day (QID) | RECTAL | Status: DC | PRN

## 2014-09-06 MED ORDER — MERCAPTOPURINE 50 MG PO TABS
50.0000 mg | ORAL_TABLET | Freq: Every day | ORAL | Status: DC
  Administered 2014-09-06 – 2014-09-09 (×4): 50 mg via ORAL
  Filled 2014-09-06 (×4): qty 1

## 2014-09-06 MED ORDER — LORAZEPAM 2 MG/ML IJ SOLN
2.0000 mg | INTRAMUSCULAR | Status: DC | PRN
Start: 2014-09-06 — End: 2014-09-06

## 2014-09-06 MED ORDER — SODIUM CHLORIDE 0.9 % IJ SOLN
3.0000 mL | Freq: Two times a day (BID) | INTRAMUSCULAR | Status: DC
  Administered 2014-09-06 – 2014-09-08 (×5): 3 mL via INTRAVENOUS

## 2014-09-06 NOTE — Progress Notes (Signed)
Vital signs were again refused by pt 15 minutes after CIRT

## 2014-09-06 NOTE — BHH Group Notes (Signed)
[facility] LCSW Aftercare Discharge Planning Group Note   09/06/2014 10:20 AM  Participation Quality:  Did not attend    [place]

## 2014-09-06 NOTE — Progress Notes (Signed)
We tried to take pt vital signs, 15 minutes after CIRT, pt refused and started yelling.

## 2014-09-06 NOTE — Progress Notes (Signed)
Pt had to be CIRTED to be given med . Pt continued to be verbally aggressive, threatening, delusional and disorganized.

## 2014-09-06 NOTE — BHH Suicide Risk Assessment (Signed)
   Demographic Factors:  Caucasian  Total Time spent with patient: 45 minutes  Psychiatric Specialty Exam: Physical Exam  ROS  Blood pressure 113/61, pulse 116, temperature 97.6 F (36.4 C).There is no weight on file to calculate BMI.  General Appearance: Disheveled  Eye Sport and exercise psychologist::  Fair  Speech:  Normal Rate  Volume:  Increased  Mood:  Anxious and Irritable  Affect:  Blunt  Thought Process:  Disorganized  Orientation:  Other:  to person and situation ,thinks she is in ,and that it is spring  Thought Content:  Paranoid Ideation  Suicidal Thoughts:  No  Homicidal Thoughts:  No  Memory:  memory is impaired ,remote,immediate and recent  Judgement:  Impaired  Insight:  Shallow  Psychomotor Activity:  withdrawn at times ,restless at other times  Concentration:  Poor  Recall:  Poor  Fund of Knowledge:Fair  Language: Fair  Akathisia:  No  Handed:  Right  AIMS (if indicated):     Assets:  Social Support  Sleep:  Number of Hours: 0    Musculoskeletal: Strength & Muscle Tone: within normal limits Gait & Station: normal Patient leans: in a wheelchair   Mental Status Per Nursing Assessment::   On Admission:  NA  Current Mental Status by Physician: pt continues to have waxing and waning symptoms,has labile mood ,denies SI/HI/AH/VH  Loss Factors: NA  Historical Factors: NA  Risk Reduction Factors:   Religious beliefs about death and Positive social support  Continued Clinical Symptoms:  Medical Diagnoses and Treatments/Surgeries  Cognitive Features That Contribute To Risk:  Closed-mindedness    Suicide Risk:  MINIMAL  : NO Suicidal ideation    Discharge Diagnoses:  DSM5:  Primary Psychiatric Diagnosis: Delirium secondary to multiple etiologies ,persistent ,mixed level of activity (likely medication induced -prednisone,also s/p seizure ,conside post ictal)      Non Psychiatric Diagnosis: Hx of grand mal seizure -1 episode (08/28/2014) see  PMH   Past Medical History  Diagnosis Date  . History of small bowel obstruction     2011--  [facility] --  RESOLVED -- NO SURGICAL INTERVENTION  . SUI (stress urinary incontinence, female)   . Pelvic prolapse   . Osteoporosis   . History of endometriosis   . PONV (postoperative nausea and vomiting)     AND HARD TO WAKE  . Crohn's disease   . Allergic rhinitis   . Arthritis   . H/O bladder problems   . Crohn disease     Plan Of Care/Follow-up recommendations:  Activity:  AS PER RECOMMENDATIONS OF THE PROVIDER WHO HAS ACCEPTED HER CARE Diet:  REGULAR  Is patient on multiple antipsychotic therapies at discharge:  No   Has Patient had three or more failed trials of antipsychotic monotherapy by history:  No  Recommended Plan for Multiple Antipsychotic Therapies: NA    Asher Torpey md 09/06/2014, 5:24 PM

## 2014-09-06 NOTE — Discharge Summary (Signed)
Physician Discharge Summary Note  Patient:  Diane [name] is an 63 y.o., female MRN:  [id number] DOB:  1951/08/27 Patient phone:  [phone] (home)  Patient address:   [address] [place] [address],  Total Time spent with patient: 30 minutes  Date of Admission:  09/05/2014 Date of Discharge: 09/06/14  Reason for Admission:  Psychosis, Aggression, Confusion   Discharge Diagnoses: Active Problems:   Psychotic disorder   Delirium due to multiple etiologies, persistent, mixed level of activity  Psychiatric Specialty Exam: Physical Exam  Psychiatric: Her mood appears anxious.    Review of Systems  Unable to perform ROS: acuity of condition    Blood pressure 113/61, pulse 116, temperature 97.6 F (36.4 C).There is no weight on file to calculate BMI.  Unable to obtain, patient referred for medical care                                                   Past Psychiatric History: See H&P Diagnosis:  Hospitalizations:  Outpatient Care:  Substance Abuse Care:  Self-Mutilation:  Suicidal Attempts:  Violent Behaviors:   Musculoskeletal: Strength & Muscle Tone: within normal limits Gait & Station: reports does not want to walk ,is in a wheel chair Patient leans: N/A  DSM5:  Primary Psychiatric Diagnosis: Delirium secondary to multiple etiologies ,persistent ,mixed level of activity (likely medication induced -prednisone,also s/p seizure ,conside post ictal)  Non Psychiatric Diagnosis: Hx of grand mal seizure -1 episode (08/28/2014) see PMH  Level of Care:  [facility] Course:  ICESS BERTONI is a 63 y.o. [name] who  Presented to the ED at [facility] with AMS , initially on 10/26 /15 . Patient at that time had agitation, hallucinations, and behavioral changes including not sleeping for the past 3 days. Sleeping aids have not helped her symptoms.Patient at that time reported that she was started on prednisone for  Crohn's flare up 2 weeks ago and she started having sleep issues ,agitation as well as mood symptoms.Patient also fell at [facility] ,hit her head and had LOC for 4-5 minutes. Pt also had a grandmal seizure later that day. Pt had neurology consult done as well as CT scan as well as MRI head -which were both negative.Patient was diagnosed with Acute encephalopathy and was discharged on 08/30/14. Pt had EEG done per DC summary ,which was unremarkable . Pt per neurology consult does not need to be on anticonvulsants and was asked to follow up with outpatient neurology.Patient however had worsening of symptoms and was brought back to the hospital for increased confusion and was admitted to the [facility] On 09/05/14. Pt continues to be disoriented. Pt is oriented to place and self ,but not to time. Pt thinks it is spring and that she is in [facility].Per collateral obtained from husband Mr.Fortson -who is the Health care power of attorney- pt has had no past psychiatric hx,no hx of depression ,anxiety,SI or attempts. Pt has had no past psychological stressors.Pt started having symptoms after being started on prednisone. Pt also has no past hx of seizure do ,but had a recent grand mal seizure while in the hospital few days ago.   After being admitted to the 500 hall for assessment the patient continued to be confused and agitated. She believed that she was at [facility] despite  being frequently redirected. On arrival to the unit due to her aggression she required seclusion to ensure her safety. The patient was also given IM injections of Geodon. Patient was noted to be irritable and easily agitated. After her initial assessment conducted by the Attending Psychiatrist it was decided that the patient should be transferred to the medical psych floor at [facility]. There appeared to be a possible diagnosis of delirium to explain her symptoms. The patient does not have a prior psych history. Patient left [facility] via  EMS in route to [facility] for further care.   Consults:  psychiatry  Significant Diagnostic Studies:  Chemistry panel, CBC, UA, UDS negative, CT scan of head negative  Discharge Vitals:   Blood pressure 113/61, pulse 116, temperature 97.6 F (36.4 C). There is no weight on file to calculate BMI. Lab Results:   Results for orders placed or performed during the hospital encounter of 09/01/14 (from the past 72 hour(s))  CBC with Differential     Status: Abnormal   Collection Time: 09/03/14  6:24 PM  Result Value Ref Range   WBC 7.5 4.0 - 10.5 K/uL   RBC 4.12 3.87 - 5.11 MIL/uL   Hemoglobin 12.0 12.0 - 15.0 g/dL   HCT 38.6 36.0 - 46.0 %   MCV 93.7 78.0 - 100.0 fL   MCH 29.1 26.0 - 34.0 pg   MCHC 31.1 30.0 - 36.0 g/dL   RDW 16.7 (H) 11.5 - 15.5 %   Platelets 259 150 - 400 K/uL   Neutrophils Relative % 69 43 - 77 %   Neutro Abs 5.2 1.7 - 7.7 K/uL   Lymphocytes Relative 22 12 - 46 %   Lymphs Abs 1.6 0.7 - 4.0 K/uL   Monocytes Relative 7 3 - 12 %   Monocytes Absolute 0.6 0.1 - 1.0 K/uL   Eosinophils Relative 2 0 - 5 %   Eosinophils Absolute 0.2 0.0 - 0.7 K/uL   Basophils Relative 0 0 - 1 %   Basophils Absolute 0.0 0.0 - 0.1 K/uL  Basic metabolic panel     Status: None   Collection Time: 09/03/14  6:24 PM  Result Value Ref Range   Sodium 145 137 - 147 mEq/L   Potassium 4.0 3.7 - 5.3 mEq/L   Chloride 111 96 - 112 mEq/L   CO2 21 19 - 32 mEq/L   Glucose, Bld 84 70 - 99 mg/dL   BUN 7 6 - 23 mg/dL   Creatinine, Ser 0.55 0.50 - 1.10 mg/dL   Calcium 8.4 8.4 - 10.5 mg/dL   GFR calc non Af Amer >90 >90 mL/min   GFR calc Af Amer >90 >90 mL/min    Comment: (NOTE) The eGFR has been calculated using the CKD EPI equation. This calculation has not been validated in all clinical situations. eGFR's persistently <90 mL/min signify possible Chronic Kidney Disease.    Anion gap 13 5 - 15  Troponin I     Status: None   Collection Time: 09/03/14  6:24 PM  Result Value Ref Range   Troponin  I <0.30 <0.30 ng/mL    Comment:        Due to the release kinetics of cTnI, a negative result within the first hours of the onset of symptoms does not rule out myocardial infarction with certainty. If myocardial infarction is still suspected, repeat the test at appropriate intervals.   Urinalysis, Routine w reflex microscopic     Status: None   Collection Time:  09/04/14  9:29 PM  Result Value Ref Range   Color, Urine YELLOW YELLOW   APPearance CLEAR CLEAR   Specific Gravity, Urine 1.014 1.005 - 1.030   pH 7.5 5.0 - 8.0   Glucose, UA NEGATIVE NEGATIVE mg/dL   Hgb urine dipstick NEGATIVE NEGATIVE   Bilirubin Urine NEGATIVE NEGATIVE   Ketones, ur NEGATIVE NEGATIVE mg/dL   Protein, ur NEGATIVE NEGATIVE mg/dL   Urobilinogen, UA 0.2 0.0 - 1.0 mg/dL   Nitrite NEGATIVE NEGATIVE   Leukocytes, UA NEGATIVE NEGATIVE    Comment: MICROSCOPIC NOT DONE ON URINES WITH NEGATIVE PROTEIN, BLOOD, LEUKOCYTES, NITRITE, OR GLUCOSE <1000 mg/dL.    Physical Findings: AIMS: Facial and Oral Movements Muscles of Facial Expression: None, normal Lips and Perioral Area: None, normal Jaw: None, normal Tongue: None, normal,Extremity Movements Upper (arms, wrists, hands, fingers): None, normal Lower (legs, knees, ankles, toes): None, normal, Trunk Movements Neck, shoulders, hips: None, normal, Overall Severity Severity of abnormal movements (highest score from questions above): None, normal Incapacitation due to abnormal movements: None, normal Patient's awareness of abnormal movements (rate only patient's report): No Awareness, Dental Status Current problems with teeth and/or dentures?: No Does patient usually wear dentures?: No  CIWA:  CIWA-Ar Total: 0 COWS:     Psychiatric Specialty Exam: See Psychiatric Specialty Exam and Suicide Risk Assessment completed by Attending Physician prior to discharge.  Discharge destination:  Other:  [facility]   Is patient on multiple antipsychotic therapies  at discharge:  No   Has Patient had three or more failed trials of antipsychotic monotherapy by history:  No  Recommended Plan for Multiple Antipsychotic Therapies: NA     Medication List    ASK your doctor about these medications      Indication   budesonide 3 MG 24 hr capsule  Commonly known as:  ENTOCORT EC      CALCIUM + D3 PO  Take 2 tablets by mouth daily.      cetirizine 10 MG tablet  Commonly known as:  ZYRTEC  Take 10 mg by mouth daily as needed for allergies.      cholecalciferol 1000 UNITS tablet  Commonly known as:  VITAMIN D  Take 1,000 Units by mouth daily.      fluticasone 50 MCG/ACT nasal spray  Commonly known as:  FLONASE  Place 1 spray into both nostrils daily as needed for allergies.      folic acid 1 MG tablet  Commonly known as:  FOLVITE  Take 1 mg by mouth daily.      HYDROcodone-acetaminophen 5-325 MG per tablet  Commonly known as:  NORCO/VICODIN      LORazepam 1 MG tablet  Commonly known as:  ATIVAN      mercaptopurine 50 MG tablet  Commonly known as:  PURINETHOL  Take 50 mg by mouth daily. Give on an empty stomach 1 hour before or 2 hours after meals. Caution: Chemotherapy.      mesalamine 0.375 G 24 hr capsule  Commonly known as:  APRISO  Take 1,500 mg by mouth daily.      prednisoLONE 10 MG disintegrating tablet  Commonly known as:  ORAPRED ODT      predniSONE 20 MG tablet  Commonly known as:  DELTASONE      predniSONE 5 MG tablet  Commonly known as:  DELTASONE      PROBIOTIC DAILY PO  Take 1 tablet by mouth daily. "ULTRAFLORA BRAND"      SYSTANE OP  Place 2  drops into both eyes 4 (four) times daily as needed (dry eyes).      TOVIAZ 8 MG Tb24 tablet  Generic drug:  fesoterodine  Take 8 mg by mouth daily.         Follow-up recommendations:   Per hospital staff that is providing further care.   Comments:   Take all your medications as prescribed by your mental healthcare provider.  Report any adverse effects and or  reactions from your medicines to your outpatient provider promptly.  Patient is instructed and cautioned to not engage in alcohol and or illegal drug use while on prescription medicines.  In the event of worsening symptoms, patient is instructed to call the crisis hotline, 911 and or go to the nearest ED for appropriate evaluation and treatment of symptoms.  Follow-up with your primary care provider for your other medical issues, concerns and or health care needs.   Total Discharge Time:  Greater than 30 minutes.  SignedElmarie Shiley NP-C 09/06/2014, 4:23 PM

## 2014-09-06 NOTE — Progress Notes (Signed)
PA on duty was notified and Gave Verbal order for CIRT / manual hold and medication administration.

## 2014-09-06 NOTE — BHH Counselor (Signed)
Adult Comprehensive Assessment  Patient ID: Diane [name], female   DOB: Jul 20, 1951, 63 y.o.   MRN: [id number]  Information Source: Information source:  (Husband- Diane [name] )  Current Stressors:  Family Relationships: Does not see daughter as often as she would like.  Physical health (include injuries & life threatening diseases): Difficult to walk due to hip, bladder problems  Living/Environment/Situation:  Living Arrangements: Spouse/significant other Living conditions (as described by patient or guardian): Comfortable and quiet  How long has patient lived in current situation?: 15 years What is atmosphere in current home: Comfortable  Family History:  Marital status: Married Number of Years Married: 20 What types of issues is patient dealing with in the relationship?: Husband reports they had a "normal" relationship until recently.  Additional relationship information: He is her 3rd husband  Does patient have children?: Yes How many children?: 3 How is patient's relationship with their children?: 1 daughter and 2 step children. Husband reports they have a "normal" relationship.   Childhood History:  By whom was/is the patient raised?: Mother Additional childhood history information: Parents divorced when she was 15 years old. Father abused alcohol.  Description of patient's relationship with caregiver when they were a child: good.  Patient's description of current relationship with people who raised him/her: Mother passed away 8 years ago.  Does patient have siblings?: Yes Number of Siblings: 1 Description of patient's current relationship with siblings: Brother. Good relationship.  Did patient suffer any verbal/emotional/physical/sexual abuse as a child?:  (Unable to answer) Did patient suffer from severe childhood neglect?:  (Unable to answer) Has patient ever been sexually abused/assaulted/raped as an adolescent or adult?:  (Unable to answer ) Was the patient ever a victim  of a crime or a disaster?:  (Unable to answer ) Witnessed domestic violence?:  (Unable to answer ) Has patient been effected by domestic violence as an adult?:  (Unable to answer )  Education:  Highest grade of school patient has completed: 2 years of college  Currently a student?: No Learning disability?: No  Employment/Work Situation:   Employment situation: Unemployed (Retired ) Patient's job has been impacted by current illness: No What is the longest time patient has a held a job?: 12 years  Where was the patient employed at that time?: [employer] as an Glass blower/designer Has patient ever been in the [employer]?: No  Financial Resources:   Financial resources: Income from spouse  Alcohol/Substance Abuse:   What has been your use of drugs/alcohol within the last 12 months?: Denies past history  If attempted suicide, did drugs/alcohol play a role in this?: No Alcohol/Substance Abuse Treatment Hx: Denies past history Has alcohol/substance abuse ever caused legal problems?: No  Social Support System:   Patient's Community Support System: Good Describe Community Support System: Biomedical scientist, Friends  Type of faith/religion: Christainty  How does patient's faith help to cope with current illness?: unable to answer  Leisure/Recreation:   Leisure and Hobbies: Journaling   Strengths/Needs:   What things does the patient do well?: Organizing, journaling  In what areas does patient struggle / problems for patient: unable to answer   Discharge Plan:   Does patient have access to transportation?: Yes (Husband ) Currently receiving community mental health services: No If no, would patient like referral for services when discharged?: Yes (What county[place] ) Does patient have financial barriers related to discharge medications?: No  Summary/Recommendations:   Diane [name] is a 63 year old female who presented involuntarily to [facility] for psychosis. Due  to the severity of the patient's symptoms, her  husband Diane [name] [phone] provided the information above. They have been married for 20 years. Husband reports a "normal" and stable relationship.  Husband reports he started to notice changes in her behavior in October. She was not sleeping and becoming combative. He began to notice these changes after the pt started to take steroids prescribed by her primary care doctor. In the past year she has began to experience health problems including bladder and hip problems. Pt retired about 3 years ago from the [employer]. Husband reports the patient has no history of substance abuse. She does not have prior mental health history. Husband plans for patient to return home and receive outpatient services. Recommendations include crisis stabilization, medication management, therapeutic milieu and encouraging group attendance and participation.    Hyatt,Candace. 09/06/2014
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
## 2014-09-06 NOTE — Progress Notes (Signed)
Pt taken over to [place] via EMS. Report called to Shanon Brow, RN.

## 2014-09-06 NOTE — H&P (Addendum)
Psychiatric Admission Assessment Adult  Patient Identification:  Diane [name] Date of Evaluation:  09/06/2014 Chief Complaint:  "What do you want to talk about ,I started being confused after taking the prednisone"  History of Present Illness:: Diane [name] is a 63 y.o. [name] who  Presented  to the ED at [facility] with AMS , initially on 10/26 /15 . Patient at that time  had agitation, hallucinations, and behavioral changes including not sleeping for the past 3 days. Sleeping aids have not helped her symptoms.Patient at that time reported that she was started on prednisone for Crohn's flare up 2 weeks ago and she started having sleep issues ,agitation as well as mood symptoms. Patient also fell at [facility] ,hit her head and had LOC for 4-5 minutes. Pt also had a grandmal seizure later that day. Pt had neurology consult done as well as CT scan as well as MRI head -which were both negative. Patient was diagnosed with Acute encephalopathy and was discharged on 08/30/14. Pt had EEG done per DC summary ,which was unremarkable . Pt per neurology consult does not need to be on anticonvulsants and was asked to follow up with outpatient neurology.  Patient however had worsening of symptoms and was brought back to the hospital for increased confusion and was admitted to the [facility]  On 09/05/14.  Pt seen today. Pt appears to be calm,but continues to be disoriented. Pt is oriented to place and self ,but not to time. Pt thinks it is spring and that she is in [facility].  Per collateral obtained from husband Mr.Pendergraft -who is the Health care power of attorney- pt has had no past psychiatric hx,no hx of depression ,anxiety,SI or attempts. Pt has had no past psychological stressors. Pt started having symptoms after being started on prednisone. Pt also has no past hx of seizure do ,but had a recent grand mal seizure while in the hospital few days ago.     Elements:  Location:  confusion,agitation,waxing  and waning sx. Quality:  pt is disoriented and confused ,with limited attention span ,limited concentration. Severity:  severe. Timing:  waxing and waning through out the day.. Duration:  past 1 week ,worsening since the past 2 days. Context:  delirium 2/2 multiple etiology. Associated Signs/Synptoms: Depression Symptoms:  decreased intake of food 2/2 being confused (Hypo) Manic Symptoms:  Distractibility, Impulsivity, Labiality of Mood, 2/2 to delirium  Anxiety Symptoms:  Anxious about being in hospital Psychotic Symptoms:  Paranoia, PTSD Symptoms: Negative Total Time spent with patient: 1 hour  Psychiatric Specialty Exam: Physical Exam  Constitutional: She appears well-developed and well-nourished.  HENT:  Head: Normocephalic and atraumatic.  Eyes: Conjunctivae and EOM are normal. Pupils are equal, round, and reactive to light.  Neck: Normal range of motion. Neck supple. No thyromegaly present.  Cardiovascular: Normal rate and regular rhythm.   Respiratory: Effort normal and breath sounds normal.  GI: Soft.  Musculoskeletal: Normal range of motion.  Neurological: She is disoriented.  Patient is oriented to place ,but not to time, thinks it is spring .Pt making vague comments like I cannot having surgery .Patient has poor attention span as well as concentration ,waxing and waning symptoms .  Psychiatric: Her speech is normal. Her mood appears anxious (anxious about being in the hospital). She is slowed and withdrawn (withdrawn at times). Agitated: at times. Thought content is paranoid. Cognition and memory are impaired. She expresses inappropriate judgment. She expresses no homicidal and no suicidal ideation. She is inattentive.  Review of Systems  Constitutional: Negative.   HENT: Negative.   Eyes: Negative.   Cardiovascular: Negative.   Gastrointestinal: Negative.   Genitourinary: Negative.   Musculoskeletal: Negative.   Skin: Negative.   Psychiatric/Behavioral: Negative  for suicidal ideas and hallucinations.    Blood pressure 113/61, pulse 116, temperature 97.6 F (36.4 C).There is no weight on file to calculate BMI.  General Appearance: Disheveled  Eye Sport and exercise psychologist::  Fair  Speech:  Normal Rate  Volume:  Normal  Mood:  Irritable  Affect:  Blunt  Thought Process:  linear at times,irrelevant at other times,waxing and waning  Orientation:  Other:  to [place],place ,not to time,knows she is in hospital ,thinks she is in [place]:  Paranoid Ideation and suspicious   Suicidal Thoughts:  No  Homicidal Thoughts:  No  Memory:  Immediate;   Poor Recent;   Poor Remote;   Poor  Judgement:  Impaired  Insight:  Fair  Psychomotor Activity:  Normal  Concentration:  Poor  Recall:  Poor  Fund of Knowledge:Poor  Language: Fair  Akathisia:  No  Handed:  Right  AIMS (if indicated):     Assets:  Social Support  Sleep:  Number of Hours: 0    Musculoskeletal: Strength & Muscle Tone: within normal limits Gait & Station: reports does not want to walk ,is in a wheel chair Patient leans: N/A  Past Psychiatric History: Diagnosis:None  Hospitalizations:denies  Outpatient Care:denies  Substance Abuse Care:denies  Self-Mutilation:denies  Suicidal Attempts:denies  Violent Behaviors:denies   Past Medical History:   Past Medical History  Diagnosis Date  . History of small bowel obstruction     2011--  [facility] --  RESOLVED -- NO SURGICAL INTERVENTION  . SUI (stress urinary incontinence, female)   . Pelvic prolapse   . Osteoporosis   . History of endometriosis   . PONV (postoperative nausea and vomiting)     AND HARD TO WAKE  . Crohn's disease   . Allergic rhinitis   . Arthritis   . H/O bladder problems   . Crohn disease    None. Allergies:   Allergies  Allergen Reactions  . Celebrex [Celecoxib] Anaphylaxis  . Actonel [Risedronate Sodium] Other (See Comments)    GI UPSET  . Boniva [Ibandronic Acid] Other (See Comments)    GI UPSET  .  Chantix [Varenicline] Other (See Comments)    MOOD CHANGE  . Fosamax [Alendronate Sodium] Other (See Comments)    GI UPSET  . Neosporin [Neomycin-Bacitracin Zn-Polymyx] Swelling and Other (See Comments)    REDNESS  . Nitrofuran Derivatives Other (See Comments)    CHILLS AND FEVER  . Tobradex [Tobramycin-Dexamethasone] Other (See Comments)    SEVERE EYE IRRIATION   PTA Medications: Prescriptions prior to admission  Medication Sig Dispense Refill Last Dose  . Calcium Carb-Cholecalciferol (CALCIUM + D3 PO) Take 2 tablets by mouth daily.   Past Month at Unknown time  . cetirizine (ZYRTEC) 10 MG tablet Take 10 mg by mouth daily as needed for allergies.   Past Month at Unknown time  . cholecalciferol (VITAMIN D) 1000 UNITS tablet Take 1,000 Units by mouth daily.   Past Month at Unknown time  . fesoterodine (TOVIAZ) 8 MG TB24 tablet Take 8 mg by mouth daily.   Past Week at Unknown time  . fluticasone (FLONASE) 50 MCG/ACT nasal spray Place 1 spray into both nostrils daily as needed for allergies.    Past Week at Unknown time  . folic acid (FOLVITE) 1  MG tablet Take 1 mg by mouth daily.   Past Week at Unknown time  . mercaptopurine (PURINETHOL) 50 MG tablet Take 50 mg by mouth daily. Give on an empty stomach 1 hour before or 2 hours after meals. Caution: Chemotherapy.   Past Week at Unknown time  . mesalamine (APRISO) 0.375 G 24 hr capsule Take 1,500 mg by mouth daily.    Past Week at Unknown time  . Probiotic Product (PROBIOTIC DAILY PO) Take 1 tablet by mouth daily. "ULTRAFLORA BRAND"   Past Week at Unknown time  . budesonide (ENTOCORT EC) 3 MG 24 hr capsule   3   . HYDROcodone-acetaminophen (NORCO/VICODIN) 5-325 MG per tablet   0   . LORazepam (ATIVAN) 1 MG tablet   0   . Polyethyl Glycol-Propyl Glycol (SYSTANE OP) Place 2 drops into both eyes 4 (four) times daily as needed (dry eyes).   Unknown at Unknown time  . prednisoLONE (ORAPRED ODT) 10 MG disintegrating tablet   0   . predniSONE  (DELTASONE) 20 MG tablet   1   . predniSONE (DELTASONE) 5 MG tablet   0     Previous Psychotropic Medications:  Medication/Dose  See MAR               Substance Abuse History in the last 12 months:  No.  Consequences of Substance Abuse: NA  Social History:  reports that she has quit smoking. Her smoking use included Cigarettes. She has a 30 pack-year smoking history. She has never used smokeless tobacco. She reports that she drinks about 4.2 oz of alcohol per week. She reports that she does not use illicit drugs. 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 Legal History:denies Hobbies/Interests:unknown  Family History:   Family History  Problem Relation Age of Onset  . Dementia Mother     Results for orders placed or performed during the hospital encounter of 09/01/14 (from the past 72 hour(s))  CBC with Differential     Status: Abnormal   Collection Time: 09/03/14  6:24 PM  Result Value Ref Range   WBC 7.5 4.0 - 10.5 K/uL   RBC 4.12 3.87 - 5.11 MIL/uL   Hemoglobin 12.0 12.0 - 15.0 g/dL   HCT 38.6 36.0 - 46.0 %   MCV 93.7 78.0 - 100.0 fL   MCH 29.1 26.0 - 34.0 pg   MCHC 31.1 30.0 - 36.0 g/dL   RDW 16.7 (H) 11.5 - 15.5 %   Platelets 259 150 - 400 K/uL   Neutrophils Relative % 69 43 - 77 %   Neutro Abs 5.2 1.7 - 7.7 K/uL   Lymphocytes Relative 22 12 - 46 %   Lymphs Abs 1.6 0.7 - 4.0 K/uL   Monocytes Relative 7 3 - 12 %   Monocytes Absolute 0.6 0.1 - 1.0 K/uL   Eosinophils Relative 2 0 - 5 %   Eosinophils Absolute 0.2 0.0 - 0.7 K/uL   Basophils  Relative 0 0 - 1 %   Basophils Absolute 0.0 0.0 - 0.1 K/uL  Basic metabolic panel     Status: None  Collection Time: 09/03/14  6:24 PM  Result Value Ref Range   Sodium 145 137 - 147 mEq/L   Potassium 4.0 3.7 - 5.3 mEq/L   Chloride 111 96 - 112 mEq/L   CO2 21 19 - 32 mEq/L   Glucose, Bld 84 70 - 99 mg/dL   BUN 7 6 - 23 mg/dL   Creatinine, Ser 0.55 0.50 - 1.10 mg/dL   Calcium 8.4 8.4 - 10.5 mg/dL   GFR calc non Af Amer >90 >90 mL/min   GFR calc Af Amer >90 >90 mL/min    Comment: (NOTE) The eGFR has been calculated using the CKD EPI equation. This calculation has not been validated in all clinical situations. eGFR's persistently <90 mL/min signify possible Chronic Kidney Disease.    Anion gap 13 5 - 15  Troponin I     Status: None   Collection Time: 09/03/14  6:24 PM  Result Value Ref Range   Troponin I <0.30 <0.30 ng/mL    Comment:        Due to the release kinetics of cTnI, a negative result within the first hours of the onset of symptoms does not rule out myocardial infarction with certainty. If myocardial infarction is still suspected, repeat the test at appropriate intervals.   Urinalysis, Routine w reflex microscopic     Status: None   Collection Time: 09/04/14  9:29 PM  Result Value Ref Range   Color, Urine YELLOW YELLOW   APPearance CLEAR CLEAR   Specific Gravity, Urine 1.014 1.005 - 1.030   pH 7.5 5.0 - 8.0   Glucose, UA NEGATIVE NEGATIVE mg/dL   Hgb urine dipstick NEGATIVE NEGATIVE   Bilirubin Urine NEGATIVE NEGATIVE   Ketones, ur NEGATIVE NEGATIVE mg/dL   Protein, ur NEGATIVE NEGATIVE mg/dL   Urobilinogen, UA 0.2 0.0 - 1.0 mg/dL   Nitrite NEGATIVE NEGATIVE   Leukocytes, UA NEGATIVE NEGATIVE    Comment: MICROSCOPIC NOT DONE ON URINES WITH NEGATIVE PROTEIN, BLOOD, LEUKOCYTES, NITRITE, OR GLUCOSE <1000 mg/dL.   Psychological Evaluations:none  Assessment:  Patient is a 63 year old CF ,who has no past psychiatric hx ,who started having confusion as well as  agitation at home. Pt denies any depression ,any psychological stressors or past suicide attempts /current SI or substance use disorder. Collateral information obtained from husband confirms the above .     DSM5:  Primary Psychiatric Diagnosis: Delirium secondary to multiple etiologies ,persistent ,mixed level of activity (likely medication induced -prednisone,also s/p seizure ,conside post ictal)      Non Psychiatric Diagnosis: Hx of grand mal seizure -1 episode (08/28/2014) see PMH   Past Medical History  Diagnosis Date  . History of small bowel obstruction     2011--  [facility] --  RESOLVED -- NO SURGICAL INTERVENTION  . SUI (stress urinary incontinence, female)   . Pelvic prolapse   . Osteoporosis   . History of endometriosis   . PONV (postoperative nausea and vomiting)     AND HARD TO WAKE  . Crohn's disease   . Allergic rhinitis   . Arthritis   . H/O bladder problems   . Crohn disease     Treatment Plan/Recommendations:   Patient has Delirium 2/2 multiple etiologies. Pt will benefit from a small scheduled dose of antipsychotic ,Haldol 2 mg po bid IM/IV or PO . Haldol 2.5 mg po q8 h prn po/im for agitation along with Cogentin 0.25 mg po prn or 0.5 mg IM prn for EPS. Do not exceed more than  Haldol 15 mg in 24 hours. Please keep an eye on her QTC since Haldol can prolong her Qtc . Try to minimize the use of anticholinergic medications including benadryl /as well as benzodiazepines which can worsen her symptoms. Try to reorient her to her surroundings all day , keep the light bright in her room during day time and dark at night inorder to help her sleep-wake cycle. Could start a small dose of trazodone 50 mg po qhs for sleep.      Treatment Plan Summary: Patient needs to be managed for delirium. Current Medications:  Current Facility-Administered Medications  Medication Dose Route Frequency Provider Last Rate Last Dose  . acetaminophen (TYLENOL) tablet 650 mg  650  mg Oral Q6H PRN Durward Parcel, MD      . haloperidol (HALDOL) tablet 5 mg  5 mg Oral Q6H PRN Ursula Alert, MD       And  . benztropine (COGENTIN) tablet 0.5 mg  0.5 mg Oral Q6H PRN Ursula Alert, MD      . haloperidol lactate (HALDOL) injection 5 mg  5 mg Intramuscular Q6H PRN Ursula Alert, MD   5 mg at 09/05/14 2339   And  . benztropine mesylate (COGENTIN) injection 0.5 mg  0.5 mg Intramuscular Q6H PRN Rigby Leonhardt, MD      . LORazepam (ATIVAN) injection 2 mg  2 mg Intravenous PRN Lynessa Almanzar, MD      . magnesium hydroxide (MILK OF MAGNESIA) suspension 30 mL  30 mL Oral Daily PRN Durward Parcel, MD      . propranolol (INDERAL) tablet 20 mg  20 mg Oral BID Ursula Alert, MD   Stopped at 09/05/14 1700  . traZODone (DESYREL) tablet 50 mg  50 mg Oral QHS,MR X 1 Spencer E Simon, PA-C   50 mg at 09/05/14 2300  . ziprasidone (GEODON) injection 10 mg  10 mg Intramuscular Once Elmarie Shiley, NP   10 mg at 09/05/14 1448    Observation Level/Precautions:  Fall 1 to 1  Laboratory:  as needed.      Consultations:  hospitalist consult to transfer pt to medical floor.         I certify that inpatient services furnished can reasonably be expected to improve the patient's condition.   Yahshua Thibault MD 11/4/201510:26 AM

## 2014-09-06 NOTE — Progress Notes (Addendum)
MRI will be attempted in the morning.  MRI attempted to bring patient down for test, but patient adamantly refused.  MRI stated that they had a tight window to complete test and would not be able to complete tonight.  This nurse asked them to attempt again in the morning.  Patient will probably need to receive something for sedation prior to transport.  Durwin Nora RN

## 2014-09-06 NOTE — Progress Notes (Addendum)
Pt was placed in the Quiet , then pt was escorted to her room by nursing staff. Diane [name] and Ryan walked pt to room at 130 .

## 2014-09-06 NOTE — Progress Notes (Signed)
Pt has been offered water by staff and pt continued to spill water on floor and  Soaking bed. Pt yelling in room off and on, randomly.

## 2014-09-06 NOTE — Progress Notes (Signed)
Nursing 1:1 note D:Pt observed lying in bed. RR even and unlabored. No distress noted.Pt continues to be delusional, disorganized, and have flight of ideas.   A: 1:1 observation continues for safety  R: pt remains safe

## 2014-09-06 NOTE — Progress Notes (Signed)
I have spoken to [facility] -patient's husband that pt is going to be transferred to the medical floor. He agrees with the plan.  Ursula Alert ,MD Attending [facility]

## 2014-09-06 NOTE — Progress Notes (Signed)
Nursing 1:1 note D:Pt observed lying in bed. RR even and unlabored. No distress noted. A: 1:1 observation continues for safety  R: pt remains safe

## 2014-09-06 NOTE — Procedures (Signed)
ELECTROENCEPHALOGRAM REPORT  Patient: Diane [name]       Room #: 502BH EEG No. ID: [id number] Age: 63 y.o.        Sex: female Referring Physician: Rockne Menghini, C Report Date:  09/06/2014        Interpreting Physician: Anthony Sar  History: [name] is an 63 y.o. female with progressively worsening behavioral changes for about 10 days, as well as recent generalized seizure. Previous EEG was unremarkable.  Indications for study:  Rule out encephalopathy; rule out seizure disorder.  Technique: This is an 18 channel routine scalp EEG performed at the bedside with bipolar and monopolar montages arranged in accordance to the international 10/20 system of electrode placement.   Description: This EEG recording was performed during wakefulness and during sleep. Background activity during wakefulness consisted of diffuse low amplitude 20-25 Hz beta activity as well as 10-11 Hz alpha rhythm recorded from the posterior head regions. Photic stimulation and hyperventilation were not performed. During sleep though was generalized slowing of background activity symmetrically. During stage II of sleep symmetrical vertex waves, sleep spindles and arousal responses were recorded. No epileptiform discharges were recorded. There was no abnormal slowing of cerebral activity.  Interpretation: This is a normal EEG recording during wakefulness and during sleep.   Rush Farmer M.D. [facility] [phone]

## 2014-09-06 NOTE — Progress Notes (Signed)
[facility] Adult Case Management Discharge Plan :  Will you be returning to the same living situation after discharge: No. Pt has been accepted on Medical Floor at [facility] for treatment of delerium.   At discharge, do you have transportation home?:Yes,  [place] or [facility] Do you have the ability to pay for your medications:Yes,  insurance  Release of information consent forms completed and in the chart;  Patient's signature needed at discharge.  Patient to Follow up at:   Patient denies SI/HI:   No  Delerium    Safety Planning and Suicide Prevention discussed:  No. Delerium  Trish Mage 09/06/2014, 2:54 PM

## 2014-09-06 NOTE — Progress Notes (Signed)
Called  Pt Power of [place] and explained situation with Pt last night, explained we had to force medication due to pt being loud, disorganized, refusing to take sleep medication. Pt POA stated thank you for the call and keeping me informed.

## 2014-09-06 NOTE — Progress Notes (Signed)
Safety zone done

## 2014-09-06 NOTE — H&P (Addendum)
History and Physical:    Diane [name] SWH:[id number] DOB: 1951/02/18 DOA: 09/06/2014  Referring physician: Dr. Shea Evans PCP: Antony Blackbird, MD  GI: Dr. Watt Climes  Chief Complaint: Delusions, hallucinations, agitation.  History of Present Illness:   DAILYNN Diane [name] is an 63 y.o. female with PMH of Crohn's disease, endometriosis and osteoporosis who was recently admitted 08/28/14-08/30/14 for evaluation of altered mental status/acute encephalopathy. Her mental status changes were thought to be secondary to polypharmacy and steroid effects, as the patient had been started on prednisone approximately 2 weeks prior to that admission for treatment of a Crohn's flare. She also suffered from a fall at the time of admission, with loss of consciousness x 4-5 minutes and had a witnessed grand mal seizure. A CT scan of her head was negative and EEGs done on 08/29/14 and11/4/15 were also negative. An MRI of the brain also done on 08/29/14 showed no acute intracranial abnormality, mass or etiology of seizures. Her seizure was felt to be provoked by her fall and no further diagnostic evaluation was recommended by neurology, who consulted during her previous hospital stay. Patient's family brought her back to the hospital for further evaluation of increased confusion on 09/05/14 and she was admitted to [facility]. She did not have any prior history of psychiatric illness, and her symptoms were felt to be secondary to medical issues, and therefore patient's inpatient psychiatrist, Dr. Shea Evans, requested transfer to the medical service for further evaluation.  The patient's thoughts are very disorganized with frank paranoia, and auditory hallucinations (of daughter screaming).  When asked about who she lives with, she tells me his Liliane Channel and that she thinks he "died yesterday".  When asked why she thought that, she tells me she "heard him screaming too".  When asked if she knows why she is in the hospital, she tells me that is  because her step-son is "trying to take her out".  Thinks people have given her "smack".  According to her husband, her symptoms began after she was started on prednisone.  She had no prior psychiatric issues.  She has had angry outbursts, delusions, and agitation at night with inability to sleep/rest.  I contacted Dr. Perley Jain office to ascertain the timeline/course of medication changes.  The patient was last seen by Dr. Watt Climes 08/03/14 at which time she was placed on prednisone, and hydrocodone was also added to address pain.  On 08/11/14, Dr. Watt Climes noted that she "wasn't sleeping well" at a dose of 40 mg of prednisone.  Ativan added to address this.  The dose of prednisone was increased to 60 mg on 08/14/14.  On 08/23/14, Dr. Perley Jain office notes indicated that she was beginning to have mental status issues, and the prednisone was beginning to be tapered/Ativan was increased, but the patient stopped taking her prednisone on her own 08/28/14.   ROS:   Constitutional: No fever, no chills;  Appetite normal; No weight loss, no weight gain, no fatigue.  HEENT: No blurry vision, no diplopia, no pharyngitis, no dysphagia CV: No chest pain, no palpitations, no PND, no orthopnea, no edema.  Resp: No SOB, no cough, no pleuritic pain. GI: No nausea, + vomiting (yesterday morning), + diarrhea, no melena, no hematochezia, no constipation, no abdominal pain.  GU: No dysuria, no hematuria, + frequency, +urgency. MSK: no myalgias, + low back arthralgias.  Neuro:  No headache, no focal neurological deficits, + history of seizures.  Psych: No depression, no anxiety.  Endo: No heat intolerance, +cold intolerance, no  polyuria, + polydipsia  Skin: No rashes, no skin lesions.  Heme: No easy bruising.  Travel history: No recent travel.   Past Medical History:   Past Medical History  Diagnosis Date  . History of small bowel obstruction     2011--  [facility] --  RESOLVED -- NO SURGICAL INTERVENTION  . SUI (stress urinary  incontinence, female)   . Pelvic prolapse   . Osteoporosis   . History of endometriosis   . PONV (postoperative nausea and vomiting)     AND HARD TO WAKE  . Crohn's disease   . Allergic rhinitis   . Arthritis   . H/O bladder problems   . Crohn disease     Past Surgical History:   Past Surgical History  Procedure Laterality Date  . Laparoscopy/ lysis adhesions/ left salpingoophorectomy/ right ovarian cystectomy  06-08-2000  . Abdominal hysterectomy  1980  . Benign excision right breast cyst  1996  . Exploratory laparotomy  EARLY 1970'S    INFERLITITY  . Pubovaginal sling N/A 06/13/2013    Procedure: PELVIC FLOOR REPAIR  ,  WITH  UPHOLD  , ANTERIOR VAULT WITH KELLY PLICATION WITH LYNX SLING;  Surgeon: Ailene Rud, MD;  Location: [facility];  Service: Urology;  Laterality: N/A;  . Cystoscopy N/A 06/13/2013    Procedure: CYSTOSCOPY;  Surgeon: Ailene Rud, MD;  Location: [facility];  Service: Urology;  Laterality: N/A;  . Anterior and posterior repair  06/13/2013    Procedure:  AUGUEMENTED  RECTOCELE REPAIR WITH XENFORM;  Surgeon: Ailene Rud, MD;  Location: [facility];  Service: Urology;;    Social History:   History   Social History  . Marital Status: Married    Spouse Name: N/A    Number of Children: N/A  . Years of Education: N/A   Occupational History  . Not on file.   Social History Main Topics  . Smoking status: Former Smoker -- 0.75 packs/day for 40 years    Types: Cigarettes  . Smokeless tobacco: Never Used  . Alcohol Use: 4.2 oz/week    7 Glasses of wine per week  . Drug Use: No  . Sexual Activity: Not on file   Other Topics Concern  . Not on file   Social History Narrative    Family history:   Family History  Problem Relation Age of Onset  . Dementia Mother     Allergies   Celebrex; Actonel; Boniva; Chantix; Fosamax; Neosporin; Nitrofuran derivatives; and  Tobradex  Current Medications:   Prior to Admission medications   Medication Sig Start Date End Date Taking? Authorizing Provider  budesonide (ENTOCORT EC) 3 MG 24 hr capsule  07/05/14   Historical Provider, MD  Calcium Carb-Cholecalciferol (CALCIUM + D3 PO) Take 2 tablets by mouth daily.    Historical Provider, MD  cetirizine (ZYRTEC) 10 MG tablet Take 10 mg by mouth daily as needed for allergies.    Historical Provider, MD  cholecalciferol (VITAMIN D) 1000 UNITS tablet Take 1,000 Units by mouth daily.    Historical Provider, MD  fesoterodine (TOVIAZ) 8 MG TB24 tablet Take 8 mg by mouth daily.    Historical Provider, MD  fluticasone (FLONASE) 50 MCG/ACT nasal spray Place 1 spray into both nostrils daily as needed for allergies.  08/01/14   Historical Provider, MD  folic acid (FOLVITE) 1 MG tablet Take 1 mg by mouth daily.    Historical Provider, MD  HYDROcodone-acetaminophen (NORCO/VICODIN) 5-325 MG per tablet  08/04/14   Historical Provider, MD  LORazepam (ATIVAN) 1 MG tablet  08/15/14   Historical Provider, MD  mercaptopurine (PURINETHOL) 50 MG tablet Take 50 mg by mouth daily. Give on an empty stomach 1 hour before or 2 hours after meals. Caution: Chemotherapy.    Historical Provider, MD  mesalamine (APRISO) 0.375 G 24 hr capsule Take 1,500 mg by mouth daily.     Historical Provider, MD  Polyethyl Glycol-Propyl Glycol (SYSTANE OP) Place 2 drops into both eyes 4 (four) times daily as needed (dry eyes).    Historical Provider, MD  prednisoLONE (ORAPRED ODT) 10 MG disintegrating tablet  08/25/14   Historical Provider, MD  predniSONE (DELTASONE) 20 MG tablet  08/14/14   Historical Provider, MD  predniSONE (DELTASONE) 5 MG tablet  08/30/14   Historical Provider, MD  Probiotic Product (PROBIOTIC DAILY PO) Take 1 tablet by mouth daily. "ULTRAFLORA BRAND"    Historical Provider, MD    Physical Exam:   Filed Vitals:   09/06/14 1536  BP: 115/61  Pulse: 95  Temp: 98.6 F (37 C)  TempSrc: Oral   Resp: 16  Height: 5' 2"  (1.575 m)  SpO2: 97%     Physical Exam: Blood pressure 115/61, pulse 95, temperature 98.6 F (37 C), temperature source Oral, resp. rate 16, height 5' 2"  (1.575 m), SpO2 97 %. Gen: NAD, restless. Head: Normocephalic, atraumatic. Eyes: PERRL, EOMI, sclerae nonicteric. Mouth: Oropharynx clear, tongue midline. Neck: Supple, no thyromegaly, no lymphadenopathy, no jugular venous distention. Chest: Lungs CTAB. CV: Heart sounds are slightly tachycardic, regular. Abdomen: Soft, nontender, nondistended with normal active bowel sounds. Extremities: Extremities without C/E/C. Skin: Warm and dry.  Rash to lower extremities. Neuro: Alert and oriented times 3; cranial nerves II through XII grossly intact. Psych: Mood and affect anxious with clear delusions and psychosis.   Data Review:    Labs: Basic Metabolic Panel:  Recent Labs Lab 09/01/14 1121 09/02/14 1457 09/03/14 1824  NA 141 139 145  K 3.5* 3.9 4.0  CL 100 104 111  CO2 26 24 21   GLUCOSE 108* 101* 84  BUN 11 7 7   CREATININE 0.61 0.78 0.55  CALCIUM 9.5 9.2 8.4   Liver Function Tests:  Recent Labs Lab 09/01/14 1121  AST 30  ALT 42*  ALKPHOS 97  BILITOT 0.4  PROT 7.0  ALBUMIN 3.5   CBC:  Recent Labs Lab 09/01/14 1121 09/02/14 1457 09/03/14 1824  WBC 7.0 7.9 7.5  NEUTROABS 5.3 6.0 5.2  HGB 11.6* 11.7* 12.0  HCT 35.6* 36.3 38.6  MCV 88.3 89.2 93.7  PLT 288 298 259   Cardiac Enzymes:  Recent Labs Lab 09/03/14 1824  TROPONINI <0.30    BNP (last 3 results)  Recent Labs  08/28/14 1630  PROBNP 86.3   CBG: No results for input(s): GLUCAP in the last 168 hours.  Radiographic Studies: No results found.   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  Repeat MRI of the brain given recent seizure. Neuro exam normal.  TSH WNL. Check RPR/HIV.  Add low-dose Risperdal daily at bedtime.  Active Problems:   History of seizure  EEG repeated today and there is no evidence of epileptiform activity.    Crohn's disease  Discontinue all medications except for mercaptopurine.  GI consultation requested to evaluate ongoing need for treatment of Crohn's.  Case discussed with Dr. Amedeo Plenty who will see her tomorrow in consultation.    DVT prophylaxis  I believe the risks of exacerbating her psychosis/paranoia with needle sticks outweighs any potential benefit for DVT prophylaxis. Can use SCDs if she will comply.  Code Status: Full. Family Communication: Husband updated by telephone, Liliane Channel. Disposition Plan: Home when stable.  Time spent: 75 minutes.  RAMA,CHRISTINA [facility] Pager [phone] Cell: [phone]   If 7PM-7AM, please contact night-coverage [email or web address] Password [email or web address] 09/06/2014, 4:28 PM

## 2014-09-06 NOTE — [facility] Group Notes (Signed)
[facility]  09/06/2014 1:24 PM  Type of Therapy:  Group Therapy  Participation Level:  Did Not Attend  [name],Diane 09/06/2014, 1:24 PM

## 2014-09-06 NOTE — Progress Notes (Signed)
Offsite EEG completed at [facility]. Results pending.

## 2014-09-06 NOTE — Progress Notes (Signed)
  Spoke to [place]  (Hospitalist) at [phone] - who states pt is to be transferred to the medical floor -she is currently working on it.  Ursula Alert ,MD Attending [facility]

## 2014-09-06 NOTE — Progress Notes (Signed)
Observation note: Pt alert and oriented to self and time. Pt confused to situation and place. Pt thinks that she is at [facility]. Pt continues to presents with disorganized thoughts and tangential speech. Pt easily agitated on approach and irritable. Pt refused to allow staff to assess her vital signs x two attempts. Pt observed to be calm and cooperative in the dayroom after group this morning. Writer sitting in the dayroom reading workbook at this time with sitter present. Pt remains on 1:1 observation for safety.

## 2014-09-07 ENCOUNTER — Other Ambulatory Visit (HOSPITAL_COMMUNITY): Payer: Self-pay | Admitting: Radiology

## 2014-09-07 DIAGNOSIS — K5090 Crohn's disease, unspecified, without complications: Secondary | ICD-10-CM

## 2014-09-07 DIAGNOSIS — Z8669 Personal history of other diseases of the nervous system and sense organs: Secondary | ICD-10-CM

## 2014-09-07 DIAGNOSIS — F05 Delirium due to known physiological condition: Secondary | ICD-10-CM

## 2014-09-07 DIAGNOSIS — F29 Unspecified psychosis not due to a substance or known physiological condition: Secondary | ICD-10-CM

## 2014-09-07 LAB — HIV ANTIBODY (ROUTINE TESTING W REFLEX): HIV 1&2 Ab, 4th Generation: NONREACTIVE

## 2014-09-07 LAB — RPR

## 2014-09-07 MED ORDER — OLANZAPINE 10 MG PO TBDP
10.0000 mg | ORAL_TABLET | Freq: Two times a day (BID) | ORAL | Status: DC
  Filled 2014-09-07 (×2): qty 1

## 2014-09-07 MED ORDER — HALOPERIDOL 2 MG PO TABS
2.0000 mg | ORAL_TABLET | Freq: Two times a day (BID) | ORAL | Status: DC
  Administered 2014-09-07 – 2014-09-09 (×5): 2 mg via ORAL
  Filled 2014-09-07 (×6): qty 1

## 2014-09-07 MED ORDER — BOOST PLUS PO LIQD
237.0000 mL | ORAL | Status: DC
  Administered 2014-09-07: 237 mL via ORAL
  Filled 2014-09-07 (×3): qty 237

## 2014-09-07 MED ORDER — BENZTROPINE MESYLATE 0.5 MG PO TABS
0.2500 mg | ORAL_TABLET | Freq: Two times a day (BID) | ORAL | Status: DC | PRN
  Filled 2014-09-07: qty 1

## 2014-09-07 MED ORDER — TRAZODONE HCL 50 MG PO TABS: 50.0000 mg | ORAL_TABLET | Freq: Every evening | ORAL | Status: DC | PRN

## 2014-09-07 NOTE — Progress Notes (Signed)
[facility] PROGRESS NOTE  Diane [name] CWU:[id number] DOB: 02/22/1951 DOA: 09/06/2014 PCP: Antony Blackbird, MD  Brief summary  Diane [name] is an 63 y.o. female with PMH of Crohn's disease, endometriosis and osteoporosis who was recently admitted 08/28/14-08/30/14 for evaluation of altered mental status/acute encephalopathy. Her mental status changes were thought to be secondary to polypharmacy and steroid effects, as the patient had been started on prednisone approximately 2 weeks prior to that admission for treatment of a Crohn's flare. She also suffered from a fall at the time of admission, with loss of consciousness x 4-5 minutes and had a witnessed grand mal seizure. A CT scan of her head was negative and EEGs done on 08/29/14 and11/4/15 were also negative. An MRI of the brain also done on 08/29/14 showed no acute intracranial abnormality, mass or etiology of seizures. Her seizure was felt to be provoked by her fall and no further diagnostic evaluation was recommended by neurology, who consulted during her previous hospital stay. Patient's family brought her back to the hospital for further evaluation of increased confusion on 09/05/14 and she was admitted to [facility]. Dr. Shea Evans, requested transfer to the medical service for further evaluation. Upon evaluation, her through were very disorganized with frank paranoia, and auditory hallucinations (of daughter screaming). When asked about who she lives with, she reported "Diane [name]" and that she thinks he "died yesterday" because she "heard him screaming too". When asked if she knows why she is in the hospital, she tells me that is because her step-son is "trying to take her out". Thinks people have given her "smack". She had no prior psychiatric issues. She has had angry outbursts, delusions, and agitation at night with inability to sleep/rest. I contacted Dr. Perley Jain office to ascertain the timeline/course of medication changes. The patient was  last seen by Dr. Watt Climes 08/03/14 at which time she was placed on prednisone, and hydrocodone was also added to address pain. On 08/11/14, Dr. Watt Climes noted that she "wasn't sleeping well" at a dose of 40 mg of prednisone. Ativan added to address this. The dose of prednisone was increased to 60 mg on 08/14/14. On 08/23/14, Dr. Perley Jain office notes indicated that she was beginning to have mental status issues, and the prednisone was beginning to be tapered/Ativan was increased, but the patient stopped taking her prednisone on her own 08/28/14.  She was tapered off prednisone finally on 10/29.     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  According to her daughter, she has had lifelong repeated episodes of delusional thinking.  For example, she once thought a friend was trying to kill her so she moved her family to another town.  She has had periods where she thinks God speaks directly to her.  Her daughter provided other examples and states that she had tried to convince her mother to seek mental health for years, but was unable to convince her.  Prednisone may have unmasked an underlying mental health disorder.  Her husband agrees that she has becoming increasing anxious and paranoid, particularly about her health, even before her prednisone was started.    CXR neg  UA neg  UDS neg  Repeat MRI of the brain given recent seizure. Neuro exam normal.  EEG without epileptiform activity  TSH WNL, RPR NR, HIV NR  D/c risperidal  Start haldol 68m po BID per Dr. ECharlcie Cradlenote with cogentin 0.235mpo prn or EPS  Psychiatric consultation   History of seizure  EEG repeated and no evidence of epileptiform activity.   Crohn's disease  Discontinue all medications except for mercaptopurine.  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  States she had a heart attack and that she is worried about the MRI because she has pins in her eyes.    Objective: Filed Vitals:   09/06/14 1536 09/06/14 2111 09/07/14 0543  BP: 115/61 118/55 118/59  Pulse: 95 95 98  Temp: 98.6 F (37 C) 98.7 F (37.1 C) 98.8 F (37.1 C)  TempSrc: Oral Oral Oral  Resp: 16 18 16   Height: 5' 2"  (1.575 m)    Weight: 64.2 kg (141 lb 8.6 oz)    SpO2: 97% 96% 96%    Intake/Output Summary (Last 24 hours) at 09/07/14 1302 Last data filed at 09/06/14 1757  Gross per 24 hour  Intake    240 ml  Output      0 ml  Net    240 ml   Filed Weights   09/06/14 1536  Weight: 64.2 kg (141 lb 8.6 oz)    Exam:   General:  WF, No acute distress, but clearly confused, tangential.  No obvious hallucinations during exam, awake and alert during interview  HEENT:  NCAT, MMM  Cardiovascular:  RRR, nl S1, S2 no mrg, 2+ pulses, warm extremities  Respiratory:  CTAB, no increased WOB  Abdomen:   NABS, soft, NT/ND  MSK:   Normal tone and bulk, no LEE  Neuro:  CN II-XII grossly intact, strength 5/5, sensation intact to light touch  Data Reviewed: Basic Metabolic Panel:  Recent Labs Lab 09/01/14 1121 09/02/14 1457 09/03/14 1824 09/06/14 1620  NA 141 139 145 137  K 3.5* 3.9 4.0 3.7  CL 100 104 111 102  CO2 26 24 21 20   GLUCOSE 108* 101* 84 84  BUN 11 7 7 10   CREATININE 0.61 0.78 0.55 0.62  CALCIUM 9.5 9.2 8.4 8.9   Liver Function Tests:  Recent Labs Lab 09/01/14 1121 09/06/14 1620  AST  30 27  ALT 42* 26  ALKPHOS 97 83  BILITOT 0.4 0.5  PROT 7.0 7.0  ALBUMIN 3.5 3.2*   No results for input(s): LIPASE, AMYLASE in the last 168 hours. No results for input(s): AMMONIA in the last 168 hours. CBC:  Recent Labs Lab 09/01/14 1121 09/02/14 1457 09/03/14 1824 09/06/14 1620  WBC 7.0 7.9 7.5 7.2  NEUTROABS 5.3 6.0 5.2 5.1  HGB 11.6* 11.7* 12.0 11.4*  HCT 35.6* 36.3 38.6 35.6*  MCV 88.3 89.2 93.7 90.8  PLT 288 298 259 359   Cardiac Enzymes:  Recent Labs Lab 09/03/14 1824  TROPONINI <0.30   BNP (last 3 results)  Recent Labs  08/28/14 1630  PROBNP 86.3   CBG: No results for input(s): GLUCAP in the last 168 hours.  Recent Results (from the past 240 hour(s))  Urine culture     Status: None   Collection Time: 08/28/14  5:29 PM  Result Value  Ref Range Status   Specimen Description URINE, CATHETERIZED  Final   Special Requests NONE  Final   Culture  Setup Time   Final    08/28/2014 21:05 Performed at [place] Performed at [facility]  Final   Culture NO GROWTH Performed at [facility]  Final   Report Status 08/29/2014 FINAL  Final  MRSA PCR Screening     Status: None   Collection Time: 08/29/14 12:38 AM  Result Value Ref Range Status   MRSA by PCR NEGATIVE NEGATIVE Final    Comment:        The GeneXpert MRSA Assay (FDA approved for NASAL specimens only), is one component of a comprehensive MRSA colonization surveillance program. It is not intended to diagnose MRSA infection nor to guide or monitor treatment for MRSA infections.     Studies: No results found.  Scheduled Meds: . folic acid  1 mg Oral Daily  . mercaptopurine  50 mg Oral Daily  . OLANZapine zydis  10 mg Oral BID  . 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 If 7PM-7AM, please contact night-coverage at [email or web address], password [facility] 09/07/2014, 1:02 PM  LOS: 1 day
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
## 2014-09-07 NOTE — Progress Notes (Signed)
INITIAL NUTRITION ASSESSMENT  DOCUMENTATION CODES Per approved criteria  -Non-severe (moderate) malnutrition in the context of acute illness or injury   INTERVENTION: Regular diet.  Patient to choose foods best tolerated and preferences. Boost once daily. [employer] as desired.  Recommended bid. BID snacks Encouraged intake. RD to follow.  NUTRITION DIAGNOSIS: Predicted sub optimal intake related to illness as evidenced by malnutrition..   Goal: Intake of meals and supplements to meet >90% estimated needs.  Monitor:  Intake, labs, weight trend  Reason for Assessment: MST  63 y.o. female  Admitting Dx: Steroid-induced psychosis with complication  ASSESSMENT: Patient admitted with steroid induced psychosis.   Very good intake currently.  Follows "low residue" diet at home.  Poor po recently with crohn's attack.  Recent weight loss of 10 lbs in the past month.  Last seen by inpatient RD 10/27.  Height: Ht Readings from Last 1 Encounters:  09/06/14 5' 2"  (1.575 m)    Weight: Wt Readings from Last 1 Encounters:  09/06/14 141 lb 8.6 oz (64.2 kg)    Ideal Body Weight: 110 lbs  % Ideal Body Weight: 128  Wt Readings from Last 10 Encounters:  09/06/14 141 lb 8.6 oz (64.2 kg)  09/01/14 140 lb 12.8 oz (63.866 kg)  08/28/14 140 lb 12.8 oz (63.866 kg)  07/28/14 150 lb (68.04 kg)  06/13/13 148 lb (67.132 kg)  11/01/12 146 lb (66.225 kg)    Usual Body Weight: 150 lbs  % Usual Body Weight: 93  BMI:  Body mass index is 25.88 kg/(m^2).  Estimated Nutritional Needs: Kcal: 1600-1800 Protein: 80-95 gm Fluid: 1.8L daily  Skin: wound on back  Diet Order: Diet regular  EDUCATION NEEDS: -Education needs addressed   Intake/Output Summary (Last 24 hours) at 09/07/14 1408 Last data filed at 09/06/14 1757  Gross per 24 hour  Intake    240 ml  Output      0 ml  Net    240 ml    Labs:   Recent Labs Lab 09/02/14 1457 09/03/14 1824  09/06/14 1620  NA 139 145 137  K 3.9 4.0 3.7  CL 104 111 102  CO2 24 21 20   BUN 7 7 10   CREATININE 0.78 0.55 0.62  CALCIUM 9.2 8.4 8.9  GLUCOSE 101* 84 84    CBG (last 3)  No results for input(s): GLUCAP in the last 72 hours.  Scheduled Meds: . folic acid  1 mg Oral Daily  . mercaptopurine  50 mg Oral Daily  . OLANZapine zydis  10 mg Oral BID  . sodium chloride  3 mL Intravenous Q12H    Continuous Infusions:   Past Medical History  Diagnosis Date  . History of small bowel obstruction     2011--  [facility] --  RESOLVED -- NO SURGICAL INTERVENTION  . SUI (stress urinary incontinence, female)   . Pelvic prolapse   . Osteoporosis   . History of endometriosis   . PONV (postoperative nausea and vomiting)     AND HARD TO WAKE  . Crohn's disease   . Allergic rhinitis   . Arthritis   . H/O bladder problems   . Crohn disease     Past Surgical History  Procedure Laterality Date  . Laparoscopy/ lysis adhesions/ left salpingoophorectomy/ right ovarian cystectomy  06-08-2000  . Abdominal hysterectomy  1980  . Benign excision right breast cyst  1996  . Exploratory laparotomy  EARLY 1970'S    INFERLITITY  . Pubovaginal sling N/A 06/13/2013  Procedure: PELVIC FLOOR REPAIR  ,  WITH  UPHOLD  , ANTERIOR VAULT WITH KELLY PLICATION WITH LYNX SLING;  Surgeon: Ailene Rud, MD;  Location: [facility];  Service: Urology;  Laterality: N/A;  . Cystoscopy N/A 06/13/2013    Procedure: CYSTOSCOPY;  Surgeon: Ailene Rud, MD;  Location: [facility];  Service: Urology;  Laterality: N/A;  . Anterior and posterior repair  06/13/2013    Procedure:  AUGUEMENTED  RECTOCELE REPAIR WITH XENFORM;  Surgeon: Ailene Rud, MD;  Location: [facility];  Service: Urology;;    Antonieta Iba, RD, LDN Clinical Inpatient Dietitian Pager:  [phone] Weekend and after hours pager:  [phone]

## 2014-09-07 NOTE — Consult Note (Signed)
[facility] Consult Note  Referring Provider: No ref. provider found Primary Care Physician:  Antony Blackbird, MD Primary Gastroenterologist:  Dr.  Laurel Dimmer Complaint: delirium HPI: Diane [name] is an 63 y.o. white  female  With a history of Crohn's ileitis who presented with diaphoresis loss of consciousness and a possible grand mal seizure on 1027. She was released from the hospital of brought back with confusion onNovember 13. From what I can tell she was started on prednisone 60 mg October 2 sometimes stopped this on her own. She is also on a present and 6-mercaptopurine. She was originally admitted to the psychiatric ward but then transferred here. Currently she appears fairly lucid and is oriented to person time place and circumstance. She denies any abdominal pain or diarrhea.  Past Medical History  Diagnosis Date  . History of small bowel obstruction     2011--  [facility] --  RESOLVED -- NO SURGICAL INTERVENTION  . SUI (stress urinary incontinence, female)   . Pelvic prolapse   . Osteoporosis   . History of endometriosis   . PONV (postoperative nausea and vomiting)     AND HARD TO WAKE  . Crohn's disease   . Allergic rhinitis   . Arthritis   . H/O bladder problems   . Crohn disease     Past Surgical History  Procedure Laterality Date  . Laparoscopy/ lysis adhesions/ left salpingoophorectomy/ right ovarian cystectomy  06-08-2000  . Abdominal hysterectomy  1980  . Benign excision right breast cyst  1996  . Exploratory laparotomy  EARLY 1970'S    INFERLITITY  . Pubovaginal sling N/A 06/13/2013    Procedure: PELVIC FLOOR REPAIR  ,  WITH  UPHOLD  , ANTERIOR VAULT WITH KELLY PLICATION WITH LYNX SLING;  Surgeon: Ailene Rud, MD;  Location: [facility];  Service: Urology;  Laterality: N/A;  . Cystoscopy N/A 06/13/2013    Procedure: CYSTOSCOPY;  Surgeon: Ailene Rud, MD;  Location: [facility];  Service: Urology;  Laterality:  N/A;  . Anterior and posterior repair  06/13/2013    Procedure:  AUGUEMENTED  RECTOCELE REPAIR WITH XENFORM;  Surgeon: Ailene Rud, MD;  Location: [facility];  Service: Urology;;    Medications Prior to Admission  Medication Sig Dispense Refill  . cetirizine (ZYRTEC) 10 MG tablet Take 10 mg by mouth daily as needed for allergies (allergies).     . cholecalciferol (VITAMIN D) 1000 UNITS tablet Take 1,000 Units by mouth daily.    . fesoterodine (TOVIAZ) 8 MG TB24 tablet Take 8 mg by mouth daily.    . fluticasone (FLONASE) 50 MCG/ACT nasal spray Place 1 spray into both nostrils daily as needed for allergies (allergies).     . folic acid (FOLVITE) 1 MG tablet Take 1 mg by mouth daily.    Marland Kitchen LORazepam (ATIVAN) 1 MG tablet Take 1 mg by mouth at bedtime.   0  . mercaptopurine (PURINETHOL) 50 MG tablet Take 50 mg by mouth daily. Give on an empty stomach 1 hour before or 2 hours after meals. Caution: Chemotherapy.    . mesalamine (APRISO) 0.375 G 24 hr capsule Take 1,500 mg by mouth daily.     Vladimir Faster Glycol-Propyl Glycol (SYSTANE OP) Place 2 drops into both eyes 4 (four) times daily as needed (dry eyes).    . Probiotic Product (PROBIOTIC DAILY PO) Take 1 tablet by mouth daily. "ULTRAFLORA BRAND"    . Calcium Carb-Cholecalciferol (CALCIUM + D3 PO) Take  2 tablets by mouth daily.      Allergies:  Allergies  Allergen Reactions  . Celebrex [Celecoxib] Anaphylaxis  . Actonel [Risedronate Sodium] Other (See Comments)    GI UPSET  . Boniva [Ibandronic Acid] Other (See Comments)    GI UPSET  . Chantix [Varenicline] Other (See Comments)    MOOD CHANGE  . Fosamax [Alendronate Sodium] Other (See Comments)    GI UPSET  . Neosporin [Neomycin-Bacitracin Zn-Polymyx] Swelling and Other (See Comments)    REDNESS  . Nitrofuran Derivatives Other (See Comments)    CHILLS AND FEVER  . Other     Pt states allergic to 3 more medicines and doesn't know name of them.  Baird Cancer  [Tobramycin-Dexamethasone] Other (See Comments)    SEVERE EYE IRRIATION    Family History  Problem Relation Age of Onset  . Dementia Mother     Social History:  reports that she has quit smoking. Her smoking use included Cigarettes. She has a 30 pack-year smoking history. She has never used smokeless tobacco. She reports that she drinks about 4.2 oz of alcohol per week. She reports that she does not use illicit drugs.  Review of Systems: negative except as above   Blood pressure 118/59, pulse 98, temperature 98.8 F (37.1 C), temperature source Oral, resp. rate 16, height 5' 2"  (1.575 m), weight 64.2 kg (141 lb 8.6 oz), SpO2 96 %. Head: Normocephalic, without obvious abnormality, atraumatic Neck: no adenopathy, no carotid bruit, no JVD, supple, symmetrical, trachea midline and thyroid not enlarged, symmetric, no tenderness/mass/nodules Resp: clear to auscultation bilaterally Cardio: regular rate and rhythm, S1, S2 normal, no murmur, click, rub or gallop GI: abdomen soft minimal right lower quadrant tenderness Extremities: extremities normal, atraumatic, no cyanosis or edema  Results for orders placed or performed during the hospital encounter of 09/06/14 (from the past 48 hour(s))  Comprehensive metabolic panel     Status: Abnormal   Collection Time: 09/06/14  4:20 PM  Result Value Ref Range   Sodium 137 137 - 147 mEq/L   Potassium 3.7 3.7 - 5.3 mEq/L   Chloride 102 96 - 112 mEq/L   CO2 20 19 - 32 mEq/L   Glucose, Bld 84 70 - 99 mg/dL   BUN 10 6 - 23 mg/dL   Creatinine, Ser 0.62 0.50 - 1.10 mg/dL   Calcium 8.9 8.4 - 10.5 mg/dL   Total Protein 7.0 6.0 - 8.3 g/dL   Albumin 3.2 (L) 3.5 - 5.2 g/dL   AST 27 0 - 37 U/L   ALT 26 0 - 35 U/L   Alkaline Phosphatase 83 39 - 117 U/L   Total Bilirubin 0.5 0.3 - 1.2 mg/dL   GFR calc non Af Amer >90 >90 mL/min   GFR calc Af Amer >90 >90 mL/min    Comment: (NOTE) The eGFR has been calculated using the CKD EPI equation. This calculation  has not been validated in all clinical situations. eGFR's persistently <90 mL/min signify possible Chronic Kidney Disease.    Anion gap 15 5 - 15  CBC WITH DIFFERENTIAL     Status: Abnormal   Collection Time: 09/06/14  4:20 PM  Result Value Ref Range   WBC 7.2 4.0 - 10.5 K/uL   RBC 3.92 3.87 - 5.11 MIL/uL   Hemoglobin 11.4 (L) 12.0 - 15.0 g/dL   HCT 35.6 (L) 36.0 - 46.0 %   MCV 90.8 78.0 - 100.0 fL   MCH 29.1 26.0 - 34.0 pg   MCHC 32.0  30.0 - 36.0 g/dL   RDW 16.5 (H) 11.5 - 15.5 %   Platelets 359 150 - 400 K/uL   Neutrophils Relative % 71 43 - 77 %   Neutro Abs 5.1 1.7 - 7.7 K/uL   Lymphocytes Relative 16 12 - 46 %   Lymphs Abs 1.2 0.7 - 4.0 K/uL   Monocytes Relative 11 3 - 12 %   Monocytes Absolute 0.8 0.1 - 1.0 K/uL   Eosinophils Relative 2 0 - 5 %   Eosinophils Absolute 0.1 0.0 - 0.7 K/uL   Basophils Relative 0 0 - 1 %   Basophils Absolute 0.0 0.0 - 0.1 K/uL  RPR     Status: None   Collection Time: 09/06/14  4:48 PM  Result Value Ref Range   RPR NON REAC NON REAC    Comment: Performed at [facility]  HIV antibody     Status: None   Collection Time: 09/06/14  4:48 PM  Result Value Ref Range   HIV 1&2 Ab, 4th Generation NONREACTIVE NONREACTIVE    Comment: (NOTE) A NONREACTIVE HIV Ag/Ab result does not exclude HIV infection since the time frame for seroconversion is variable. If acute HIV infection is suspected, a HIV-1 RNA Qualitative TMA test is recommended. HIV-1/2 Antibody Diff         Not indicated. HIV-1 RNA, Qual TMA           Not indicated. PLEASE NOTE: This information has been disclosed to you from records whose confidentiality may be protected by state law. If your state requires such protection, then the state law prohibits you from making any further disclosure of the information without the specific written consent of the person to whom it pertains, or as otherwise permitted by law. A general authorization for the release of medical or  other information is NOT sufficient for this purpose. The performance of this assay has not been clinically validated in patients less than 65 years old. Performed at [facility]   Urinalysis, Routine w reflex microscopic     Status: Abnormal   Collection Time: 09/06/14  6:05 PM  Result Value Ref Range   Color, Urine YELLOW YELLOW   APPearance CLEAR CLEAR   Specific Gravity, Urine 1.008 1.005 - 1.030   pH 6.0 5.0 - 8.0   Glucose, UA NEGATIVE NEGATIVE mg/dL   Hgb urine dipstick NEGATIVE NEGATIVE   Bilirubin Urine NEGATIVE NEGATIVE   Ketones, ur 40 (A) NEGATIVE mg/dL   Protein, ur NEGATIVE NEGATIVE mg/dL   Urobilinogen, UA 0.2 0.0 - 1.0 mg/dL   Nitrite NEGATIVE NEGATIVE   Leukocytes, UA NEGATIVE NEGATIVE    Comment: MICROSCOPIC NOT DONE ON URINES WITH NEGATIVE PROTEIN, BLOOD, LEUKOCYTES, NITRITE, OR GLUCOSE <1000 mg/dL.   No results found.  Assessment: 1. Acute delirium with possible previous seizure toxicology screen negative, possible steroid psychosis or steroid withdrawal symptoms 2. Crohn's disease of the terminal ileum documented recently by CT scan currently without typical Crohn's flare complaints, tolerating diet Plan:  Continue 6-MP while completing any further neuropsychiatric workup( repeat head CT pending) until more clarity as to the cause of her CNS symptoms and further observation of improvement would keep on 6-MP alone for now. We'll follow with you. Sofya Moustafa C 09/07/2014, 6:47 AM

## 2014-09-07 NOTE — Consult Note (Signed)
[name] Face-to-Face Psychiatry Consult   Reason for Consult:  Psychosis and delusions Referring Physician:  [name] is an 63 y.o. female. Total Time spent with patient: 45 minutes  Assessment: AXIS I:  Psychotic Disorder NOS AXIS II:  Deferred AXIS III:   Past Medical History  Diagnosis Date  . History of small bowel obstruction     2011--  [facility] --  RESOLVED -- NO SURGICAL INTERVENTION  . SUI (stress urinary incontinence, female)   . Pelvic prolapse   . Osteoporosis   . History of endometriosis   . PONV (postoperative nausea and vomiting)     AND HARD TO WAKE  . Crohn's disease   . Allergic rhinitis   . Arthritis   . H/O bladder problems   . Crohn disease    AXIS IV:  other psychosocial or environmental problems, problems related to social environment and problems with primary support group AXIS V:  41-50 serious symptoms  Plan: Case discussed with Dr. Sheran Fava Continue haldol 2 mg PO BID for agitation, delusional psychosis Continue Cogentin for possible EPS Supportive therapy provided about ongoing stressors.  Appreciate psychiatric consultation Please contact [phone] or [phone] if needs further assistance   Subjective:   [place] [name] is a 63 y.o. female patient admitted with Psychosis and delusions.  HPI:  Patient is seen for psych consultation for increased symptoms of delusional psychosis, manic symptoms, agitation and aggressive behaviors and recent use of steroids, pain medications and ativan and lot of other medications. Patient is known to this provider from her stay in [name] and admitted to [name] and transferred due to possible medical causes like head injury and seizure during last two weeks. Patient is calm, cooperative and talkative during this visit. She has better orientation, concentration, memory but has ongoing delusional thinking, concrete interpretation of proverbs and poor insight into her condition. Patient is able to describe  that she is at the hospital but remains to believe that co-worker's office is across the hall. Patient continues to report stories such as finding out that her dtr had a son that she never told her about. Patient reports she met him at [name] and discovered she was his grandmother. Patient reports her husband needs to be drug tested to clear his name "from using all that smack." Patient reports that she was informed last night that her husband was killed so she was relieved when he came to visit her this morning. Patient speaks about seeing her family on the news and getting updates about their lives through the television.  HPI Elements:   Location:  combatic behaviors and delusions. Quality:  unable to care for herself and others. Severity:  aggressive and property damage. Timing:  multiple medications including steroids. Duration:  four weeks. Context:  medication induced psychosis and manic with agitation.  Past Psychiatric History: Past Medical History  Diagnosis Date  . History of small bowel obstruction     2011--  [facility] --  RESOLVED -- NO SURGICAL INTERVENTION  . SUI (stress urinary incontinence, female)   . Pelvic prolapse   . Osteoporosis   . History of endometriosis   . PONV (postoperative nausea and vomiting)     AND HARD TO WAKE  . Crohn's disease   . Allergic rhinitis   . Arthritis   . H/O bladder problems   . Crohn disease     reports that she has quit smoking. Her smoking use included Cigarettes. She has a  30 pack-year smoking history. She has never used smokeless tobacco. She reports that she drinks about 4.2 oz of alcohol per week. She reports that she does not use illicit drugs. Family History  Problem Relation Age of Onset  . Dementia Mother      Living Arrangements: Spouse/significant other   Abuse/Neglect [facility]) Physical Abuse: Denies Verbal Abuse: Denies Sexual Abuse: Denies Allergies:   Allergies  Allergen Reactions  . Celebrex [Celecoxib] Anaphylaxis   . Actonel [Risedronate Sodium] Other (See Comments)    GI UPSET  . Boniva [Ibandronic Acid] Other (See Comments)    GI UPSET  . Chantix [Varenicline] Other (See Comments)    MOOD CHANGE  . Fosamax [Alendronate Sodium] Other (See Comments)    GI UPSET  . Neosporin [Neomycin-Bacitracin Zn-Polymyx] Swelling and Other (See Comments)    REDNESS  . Nitrofuran Derivatives Other (See Comments)    CHILLS AND FEVER  . Other     Pt states allergic to 3 more medicines and doesn't know name of them.  Baird Cancer [Tobramycin-Dexamethasone] Other (See Comments)    SEVERE EYE IRRIATION    ACT Assessment Complete:  NO Objective: Blood pressure 118/59, pulse 98, temperature 98.8 F (37.1 C), temperature source Oral, resp. rate 16, height 5' 2" (1.575 m), weight 64.2 kg (141 lb 8.6 oz), SpO2 96 %.Body mass index is 25.88 kg/(m^2). Results for orders placed or performed during the hospital encounter of 09/06/14 (from the past 72 hour(s))  Comprehensive metabolic panel     Status: Abnormal   Collection Time: 09/06/14  4:20 PM  Result Value Ref Range   Sodium 137 137 - 147 mEq/L   Potassium 3.7 3.7 - 5.3 mEq/L   Chloride 102 96 - 112 mEq/L   CO2 20 19 - 32 mEq/L   Glucose, Bld 84 70 - 99 mg/dL   BUN 10 6 - 23 mg/dL   Creatinine, Ser 0.62 0.50 - 1.10 mg/dL   Calcium 8.9 8.4 - 10.5 mg/dL   Total Protein 7.0 6.0 - 8.3 g/dL   Albumin 3.2 (L) 3.5 - 5.2 g/dL   AST 27 0 - 37 U/L   ALT 26 0 - 35 U/L   Alkaline Phosphatase 83 39 - 117 U/L   Total Bilirubin 0.5 0.3 - 1.2 mg/dL   GFR calc non Af Amer >90 >90 mL/min   GFR calc Af Amer >90 >90 mL/min    Comment: (NOTE) The eGFR has been calculated using the CKD EPI equation. This calculation has not been validated in all clinical situations. eGFR's persistently <90 mL/min signify possible Chronic Kidney Disease.    Anion gap 15 5 - 15  CBC WITH DIFFERENTIAL     Status: Abnormal   Collection Time: 09/06/14  4:20 PM  Result Value Ref Range   WBC 7.2  4.0 - 10.5 K/uL   RBC 3.92 3.87 - 5.11 MIL/uL   Hemoglobin 11.4 (L) 12.0 - 15.0 g/dL   HCT 35.6 (L) 36.0 - 46.0 %   MCV 90.8 78.0 - 100.0 fL   MCH 29.1 26.0 - 34.0 pg   MCHC 32.0 30.0 - 36.0 g/dL   RDW 16.5 (H) 11.5 - 15.5 %   Platelets 359 150 - 400 K/uL   Neutrophils Relative % 71 43 - 77 %   Neutro Abs 5.1 1.7 - 7.7 K/uL   Lymphocytes Relative 16 12 - 46 %   Lymphs Abs 1.2 0.7 - 4.0 K/uL   Monocytes Relative 11 3 - 12 %  Monocytes Absolute 0.8 0.1 - 1.0 K/uL   Eosinophils Relative 2 0 - 5 %   Eosinophils Absolute 0.1 0.0 - 0.7 K/uL   Basophils Relative 0 0 - 1 %   Basophils Absolute 0.0 0.0 - 0.1 K/uL  RPR     Status: None   Collection Time: 09/06/14  4:48 PM  Result Value Ref Range   RPR NON REAC NON REAC    Comment: Performed at [facility]  HIV antibody     Status: None   Collection Time: 09/06/14  4:48 PM  Result Value Ref Range   HIV 1&2 Ab, 4th Generation NONREACTIVE NONREACTIVE    Comment: (NOTE) A NONREACTIVE HIV Ag/Ab result does not exclude HIV infection since the time frame for seroconversion is variable. If acute HIV infection is suspected, a HIV-1 RNA Qualitative TMA test is recommended. HIV-1/2 Antibody Diff         Not indicated. HIV-1 RNA, Qual TMA           Not indicated. PLEASE NOTE: This information has been disclosed to you from records whose confidentiality may be protected by state law. If your state requires such protection, then the state law prohibits you from making any further disclosure of the information without the specific written consent of the person to whom it pertains, or as otherwise permitted by law. A general authorization for the release of medical or other information is NOT sufficient for this purpose. The performance of this assay has not been clinically validated in patients less than 72 years old. Performed at [facility]   Urinalysis, Routine w reflex microscopic     Status: Abnormal   Collection Time:  09/06/14  6:05 PM  Result Value Ref Range   Color, Urine YELLOW YELLOW   APPearance CLEAR CLEAR   Specific Gravity, Urine 1.008 1.005 - 1.030   pH 6.0 5.0 - 8.0   Glucose, UA NEGATIVE NEGATIVE mg/dL   Hgb urine dipstick NEGATIVE NEGATIVE   Bilirubin Urine NEGATIVE NEGATIVE   Ketones, ur 40 (A) NEGATIVE mg/dL   Protein, ur NEGATIVE NEGATIVE mg/dL   Urobilinogen, UA 0.2 0.0 - 1.0 mg/dL   Nitrite NEGATIVE NEGATIVE   Leukocytes, UA NEGATIVE NEGATIVE    Comment: MICROSCOPIC NOT DONE ON URINES WITH NEGATIVE PROTEIN, BLOOD, LEUKOCYTES, NITRITE, OR GLUCOSE <1000 mg/dL.   Labs are reviewed.  Current Facility-Administered Medications  Medication Dose Route Frequency Provider Last Rate Last Dose  . 0.9 %  sodium chloride infusion  250 mL Intravenous PRN Venetia Maxon Rama, MD      . acetaminophen (TYLENOL) tablet 650 mg  650 mg Oral Q6H PRN Venetia Maxon Rama, MD       Or  . acetaminophen (TYLENOL) suppository 650 mg  650 mg Rectal Q6H PRN Christina P Rama, MD      . benztropine (COGENTIN) tablet 0.25 mg  0.25 mg Oral BID PRN Janece Canterbury, MD      . folic acid (FOLVITE) tablet 1 mg  1 mg Oral Daily Venetia Maxon Rama, MD   1 mg at 09/07/14 1129  . haloperidol (HALDOL) tablet 2 mg  2 mg Oral BID Janece Canterbury, MD      . lactose free nutrition (BOOST PLUS) liquid 237 mL  237 mL Oral Q24H Darrol Jump, RD      . mercaptopurine (PURINETHOL) tablet 50 mg  50 mg Oral Daily Venetia Maxon Rama, MD   50 mg at 09/07/14 1129  . ondansetron (ZOFRAN) tablet  4 mg  4 mg Oral Q6H PRN Venetia Maxon Rama, MD       Or  . ondansetron (ZOFRAN) injection 4 mg  4 mg Intravenous Q6H PRN Christina P Rama, MD      . sodium chloride 0.9 % injection 3 mL  3 mL Intravenous Q12H Venetia Maxon Rama, MD   3 mL at 09/07/14 1129  . sodium chloride 0.9 % injection 3 mL  3 mL Intravenous PRN Venetia Maxon Rama, MD      . traZODone (DESYREL) tablet 50 mg  50 mg Oral QHS PRN Janece Canterbury, MD        Psychiatric Specialty  Exam: Physical Exam as per history and physical  Review of Systems  Psychiatric/Behavioral: Positive for hallucinations and memory loss. The patient is nervous/anxious and has insomnia.     Blood pressure 118/59, pulse 98, temperature 98.8 F (37.1 C), temperature source Oral, resp. rate 16, height 5' 2" (1.575 m), weight 64.2 kg (141 lb 8.6 oz), SpO2 96 %.Body mass index is 25.88 kg/(m^2).  General Appearance: Casual  Eye Contact::  Good  Speech:  Clear and Coherent  Volume:  Decreased  Mood:  Anxious and Depressed  Affect:  Appropriate and Congruent  Thought Process:  Coherent and Goal Directed  Orientation:  Full (Time, Place, and Person)  Thought Content:  Hallucinations: Auditory Visual, Paranoid Ideation and Rumination  Suicidal Thoughts:  No  Homicidal Thoughts:  No  Memory:  Immediate;   Fair Recent;   Fair  Judgement:  Impaired  Insight:  Lacking  Psychomotor Activity:  Decreased  Concentration:  Fair  Recall:  [employer] of Knowledge:Good  Language: Good  Akathisia:  NA  Handed:  Right  AIMS (if indicated):     Assets:  Communication Skills Desire for Improvement Financial Resources/Insurance Housing Intimacy Leisure Time Resilience Social Support Talents/Skills Transportation Vocational/Educational  Sleep:      Musculoskeletal: Strength & Muscle Tone: within normal limits Gait & Station: normal Patient leans: N/A  Treatment Plan Summary: Daily contact with patient to assess and evaluate symptoms and progress in treatment Medication management  Continue haldol and cogentin as planned Follow up as clinically required.  Luan Maberry,JANARDHAHA R. 09/07/2014 2:51 PM

## 2014-09-08 ENCOUNTER — Inpatient Hospital Stay (HOSPITAL_COMMUNITY): Payer: 59

## 2014-09-08 DIAGNOSIS — F19950 Other psychoactive substance use, unspecified with psychoactive substance-induced psychotic disorder with delusions: Secondary | ICD-10-CM

## 2014-09-08 MED ORDER — GADOBENATE DIMEGLUMINE 529 MG/ML IV SOLN
15.0000 mL | Freq: Once | INTRAVENOUS | Status: AC | PRN
  Administered 2014-09-08: 13 mL via INTRAVENOUS

## 2014-09-08 NOTE — Plan of Care (Signed)
Problem: Alteration in thought process Goal: LTG-Patient has not harmed self or others in at least 2 days Outcome: Completed/Met Date Met:  09/08/14 Goal: STG-Patient is able to follow short directions Outcome: Completed/Met Date Met:  09/08/14

## 2014-09-08 NOTE — Plan of Care (Signed)
Problem: Alteration in thought process Goal: STG-Patient is able to discuss thoughts with staff Outcome: Completed/Met Date Met:  09/08/14

## 2014-09-08 NOTE — Consult Note (Signed)
Psychiatry Consult Follow up note  Reason for Consult:  Psychosis and delusions Referring Physician:  [name] is an 63 y.o. female. Total Time spent with patient: 30 minutes  Assessment: AXIS I:  Psychotic Disorder NOS AXIS II:  Deferred AXIS III:   Past Medical History  Diagnosis Date  . History of small bowel obstruction     2011--  [facility] --  RESOLVED -- NO SURGICAL INTERVENTION  . SUI (stress urinary incontinence, female)   . Pelvic prolapse   . Osteoporosis   . History of endometriosis   . PONV (postoperative nausea and vomiting)     AND HARD TO WAKE  . Crohn's disease   . Allergic rhinitis   . Arthritis   . H/O bladder problems   . Crohn disease    AXIS IV:  other psychosocial or environmental problems, problems related to social environment and problems with primary support group AXIS V:  41-50 serious symptoms  Plan: Case discussed with patient husband, daughter and Sindy Messing, [place] Continue haldol 2 mg PO BID for agitation, delusional psychosis Continue Cogentin 0.25 mg PO BID for possible EPS Supportive therapy provided about ongoing stressors.  Appreciate psychiatric consultation and will follow up on Monday if clinically needed Please contact [phone] or [phone] if needs further assistance   Subjective:   Diane [name] is a 63 y.o. female patient admitted with Psychosis and delusions.  HPI:  Patient is seen for psych consultation for increased symptoms of delusional psychosis, manic symptoms, agitation and aggressive behaviors and recent use of steroids, pain medications and ativan and lot of other medications. Patient is known to this provider from her stay in [facility] and admitted to [facility] and transferred due to possible medical causes like head injury and seizure during last two weeks. Patient is calm, cooperative and talkative during this visit. She has better orientation, concentration, memory but has ongoing delusional thinking,  concrete interpretation of proverbs and poor insight into her condition. Patient is able to describe that she is at the hospital but remains to believe that co-worker's office is across the hall. Patient continues to report stories such as finding out that her dtr had a son that she never told her about. Patient reports she met him at [facility] and discovered she was his grandmother. Patient reports her husband needs to be drug tested to clear his name "from using all that smack." Patient reports that she was informed last night that her husband was killed so she was relieved when he came to visit her this morning. Patient speaks about seeing her family on the news and getting updates about their lives through the television.  Interval history: Patient seen for psych consultation follow up today along with case management. Patient husband and daughter provided information about the patient. She has been stable without reported irritability, agitation, aggression, screaming and yelling since yesterday. She has been compliant with her medication but no adverse effects. She has denied disturbance of sleep and appetite. She has episodes of confusion, disorientation and perception of people sitting in her room and talking about things not happening at current time. She has tangential and circumstantial thought process from time to time. She has concrete thinking when asked about proverb interpretation. Patient and her family has satisfaction with her response to her current medications. Patient feels she can go home and her family feels she may able to tolerate out patient care at this time. Social service able  to provide psych out patient referral as a part of disposition plan. She has denied current suicide/homicide ideations, intention or plans.   Past Psychiatric History: Past Medical History  Diagnosis Date  . History of small bowel obstruction     2011--  PARITAL --  RESOLVED -- NO SURGICAL INTERVENTION  . SUI  (stress urinary incontinence, female)   . Pelvic prolapse   . Osteoporosis   . History of endometriosis   . PONV (postoperative nausea and vomiting)     AND HARD TO WAKE  . Crohn's disease   . Allergic rhinitis   . Arthritis   . H/O bladder problems   . Crohn disease     reports that she has quit smoking. Her smoking use included Cigarettes. She has a 30 pack-year smoking history. She has never used smokeless tobacco. She reports that she drinks about 4.2 oz of alcohol per week. She reports that she does not use illicit drugs. Family History  Problem Relation Age of Onset  . Dementia Mother      Living Arrangements: Spouse/significant other   Abuse/Neglect [facility]) Physical Abuse: Denies Verbal Abuse: Denies Sexual Abuse: Denies Allergies:   Allergies  Allergen Reactions  . Celebrex [Celecoxib] Anaphylaxis  . Actonel [Risedronate Sodium] Other (See Comments)    GI UPSET  . Boniva [Ibandronic Acid] Other (See Comments)    GI UPSET  . Chantix [Varenicline] Other (See Comments)    MOOD CHANGE  . Fosamax [Alendronate Sodium] Other (See Comments)    GI UPSET  . Neosporin [Neomycin-Bacitracin Zn-Polymyx] Swelling and Other (See Comments)    REDNESS  . Nitrofuran Derivatives Other (See Comments)    CHILLS AND FEVER  . Other     Pt states allergic to 3 more medicines and doesn't know name of them.  Baird Cancer [Tobramycin-Dexamethasone] Other (See Comments)    SEVERE EYE IRRIATION    ACT Assessment Complete:  NO Objective: Blood pressure 90/52, pulse 86, temperature 98.6 F (37 C), temperature source Oral, resp. rate 18, height 5' 2"  (1.575 m), weight 64.2 kg (141 lb 8.6 oz), SpO2 96 %.Body mass index is 25.88 kg/(m^2). Results for orders placed or performed during the hospital encounter of 09/06/14 (from the past 72 hour(s))  Comprehensive metabolic panel     Status: Abnormal   Collection Time: 09/06/14  4:20 PM  Result Value Ref Range   Sodium 137 137 - 147 mEq/L    Potassium 3.7 3.7 - 5.3 mEq/L   Chloride 102 96 - 112 mEq/L   CO2 20 19 - 32 mEq/L   Glucose, Bld 84 70 - 99 mg/dL   BUN 10 6 - 23 mg/dL   Creatinine, Ser 0.62 0.50 - 1.10 mg/dL   Calcium 8.9 8.4 - 10.5 mg/dL   Total Protein 7.0 6.0 - 8.3 g/dL   Albumin 3.2 (L) 3.5 - 5.2 g/dL   AST 27 0 - 37 U/L   ALT 26 0 - 35 U/L   Alkaline Phosphatase 83 39 - 117 U/L   Total Bilirubin 0.5 0.3 - 1.2 mg/dL   GFR calc non Af Amer >90 >90 mL/min   GFR calc Af Amer >90 >90 mL/min    Comment: (NOTE) The eGFR has been calculated using the CKD EPI equation. This calculation has not been validated in all clinical situations. eGFR's persistently <90 mL/min signify possible Chronic Kidney Disease.    Anion gap 15 5 - 15  CBC WITH DIFFERENTIAL     Status: Abnormal  Collection Time: 09/06/14  4:20 PM  Result Value Ref Range   WBC 7.2 4.0 - 10.5 K/uL   RBC 3.92 3.87 - 5.11 MIL/uL   Hemoglobin 11.4 (L) 12.0 - 15.0 g/dL   HCT 35.6 (L) 36.0 - 46.0 %   MCV 90.8 78.0 - 100.0 fL   MCH 29.1 26.0 - 34.0 pg   MCHC 32.0 30.0 - 36.0 g/dL   RDW 16.5 (H) 11.5 - 15.5 %   Platelets 359 150 - 400 K/uL   Neutrophils Relative % 71 43 - 77 %   Neutro Abs 5.1 1.7 - 7.7 K/uL   Lymphocytes Relative 16 12 - 46 %   Lymphs Abs 1.2 0.7 - 4.0 K/uL   Monocytes Relative 11 3 - 12 %   Monocytes Absolute 0.8 0.1 - 1.0 K/uL   Eosinophils Relative 2 0 - 5 %   Eosinophils Absolute 0.1 0.0 - 0.7 K/uL   Basophils Relative 0 0 - 1 %   Basophils Absolute 0.0 0.0 - 0.1 K/uL  RPR     Status: None   Collection Time: 09/06/14  4:48 PM  Result Value Ref Range   RPR NON REAC NON REAC    Comment: Performed at [facility]  HIV antibody     Status: None   Collection Time: 09/06/14  4:48 PM  Result Value Ref Range   HIV 1&2 Ab, 4th Generation NONREACTIVE NONREACTIVE    Comment: (NOTE) A NONREACTIVE HIV Ag/Ab result does not exclude HIV infection since the time frame for seroconversion is variable. If acute HIV infection is  suspected, a HIV-1 RNA Qualitative TMA test is recommended. HIV-1/2 Antibody Diff         Not indicated. HIV-1 RNA, Qual TMA           Not indicated. PLEASE NOTE: This information has been disclosed to you from records whose confidentiality may be protected by state law. If your state requires such protection, then the state law prohibits you from making any further disclosure of the information without the specific written consent of the person to whom it pertains, or as otherwise permitted by law. A general authorization for the release of medical or other information is NOT sufficient for this purpose. The performance of this assay has not been clinically validated in patients less than 57 years old. Performed at [facility]   Urinalysis, Routine w reflex microscopic     Status: Abnormal   Collection Time: 09/06/14  6:05 PM  Result Value Ref Range   Color, Urine YELLOW YELLOW   APPearance CLEAR CLEAR   Specific Gravity, Urine 1.008 1.005 - 1.030   pH 6.0 5.0 - 8.0   Glucose, UA NEGATIVE NEGATIVE mg/dL   Hgb urine dipstick NEGATIVE NEGATIVE   Bilirubin Urine NEGATIVE NEGATIVE   Ketones, ur 40 (A) NEGATIVE mg/dL   Protein, ur NEGATIVE NEGATIVE mg/dL   Urobilinogen, UA 0.2 0.0 - 1.0 mg/dL   Nitrite NEGATIVE NEGATIVE   Leukocytes, UA NEGATIVE NEGATIVE    Comment: MICROSCOPIC NOT DONE ON URINES WITH NEGATIVE PROTEIN, BLOOD, LEUKOCYTES, NITRITE, OR GLUCOSE <1000 mg/dL.   Labs are reviewed.  Current Facility-Administered Medications  Medication Dose Route Frequency Provider Last Rate Last Dose  . 0.9 %  sodium chloride infusion  250 mL Intravenous PRN Venetia Maxon Rama, MD      . acetaminophen (TYLENOL) tablet 650 mg  650 mg Oral Q6H PRN Venetia Maxon Rama, MD       Or  .  acetaminophen (TYLENOL) suppository 650 mg  650 mg Rectal Q6H PRN Christina P Rama, MD      . benztropine (COGENTIN) tablet 0.25 mg  0.25 mg Oral BID PRN Janece Canterbury, MD      . folic acid (FOLVITE)  tablet 1 mg  1 mg Oral Daily Venetia Maxon Rama, MD   1 mg at 09/08/14 1001  . haloperidol (HALDOL) tablet 2 mg  2 mg Oral BID Janece Canterbury, MD   2 mg at 09/08/14 1001  . lactose free nutrition (BOOST PLUS) liquid 237 mL  237 mL Oral Q24H Darrol Jump, RD   237 mL at 09/07/14 1626  . mercaptopurine (PURINETHOL) tablet 50 mg  50 mg Oral Daily Venetia Maxon Rama, MD   50 mg at 09/07/14 1129  . ondansetron (ZOFRAN) tablet 4 mg  4 mg Oral Q6H PRN Christina P Rama, MD       Or  . ondansetron (ZOFRAN) injection 4 mg  4 mg Intravenous Q6H PRN Christina P Rama, MD      . sodium chloride 0.9 % injection 3 mL  3 mL Intravenous Q12H Venetia Maxon Rama, MD   3 mL at 09/07/14 2200  . sodium chloride 0.9 % injection 3 mL  3 mL Intravenous PRN Venetia Maxon Rama, MD      . traZODone (DESYREL) tablet 50 mg  50 mg Oral QHS PRN Janece Canterbury, MD        Psychiatric Specialty Exam: Physical Exam as per history and physical  Review of Systems  Psychiatric/Behavioral: Positive for hallucinations and memory loss. The patient is nervous/anxious and has insomnia.     Blood pressure 90/52, pulse 86, temperature 98.6 F (37 C), temperature source Oral, resp. rate 18, height 5' 2"  (1.575 m), weight 64.2 kg (141 lb 8.6 oz), SpO2 96 %.Body mass index is 25.88 kg/(m^2).  General Appearance: Casual  Eye Contact::  Good  Speech:  Clear and Coherent  Volume:  Normal  Mood:  Anxious  Affect:  Appropriate and Congruent  Thought Process:  Coherent and Goal Directed  Orientation:  Full (Time, Place, and Person)  Thought Content:  Delusions and Hallucinations: Auditory Visual  Suicidal Thoughts:  No  Homicidal Thoughts:  No  Memory:  Immediate;   Fair Recent;   Fair  Judgement:  Intact  Insight:  Lacking  Psychomotor Activity:  Normal  Concentration:  Fair  Recall:  [employer] of Knowledge:Good  Language: Good  Akathisia:  NA  Handed:  Right  AIMS (if indicated):     Assets:  Communication Skills Desire for  Improvement Financial Resources/Insurance Housing Intimacy Leisure Time Resilience Social Support Talents/Skills Transportation Vocational/Educational  Sleep:      Musculoskeletal: Strength & Muscle Tone: within normal limits Gait & Station: normal Patient leans: N/A  Treatment Plan Summary: Daily contact with patient to assess and evaluate symptoms and progress in treatment Medication management  Continue haldol 2 mg PO BID for psychosis and agitation and cogentin 0.25 mg PO BID as planned Psych consult follow up on Monday if clinically required Psych social service can provide out patient referral as a disposition plans.  Kelie Gainey,JANARDHAHA R. 09/08/2014 11:04 AM

## 2014-09-08 NOTE — Progress Notes (Signed)
Clinical Social Work  CSW met with patient, husband, and dtr at bedside. Throughout the assessment, psych MD joined the conversation at bedside. Patient reports she is not as confused as she was before but still has been experiencing some auditory hallucinations. Patient reports this morning she thought she heard her dtr calling her name but family informed her that dtr never came to the hospital. Patient's family report patient was delusional when watching TV and believed that she and family were on the news. Patient's family was easily able to redirect her and turned off the TV. Psych MD suggesting outpatient follow up for medication management. CSW provided patient with outpatient resources and patient reports she will work with husband in order to schedule an appointment where he can provide transportation. CSW and family spoke about patient being more involved with community activities and having more social interaction. CSW provided patient with information for [employer] in case she was interested in their volunteer program. Patient seemed interested in this information and reports when she is feeling better she will reach out to get engaged in the community.  CSW will continue to follow.  [place], [place] [phone]

## 2014-09-08 NOTE — Progress Notes (Signed)
Patient Discharge Instructions:  Next Level Care Provider Has Access to the EMR, 09/08/14  Patient was transferred to [facility].  The entire medical record is made available via CHL/Epic access.  Patsey Berthold, 09/08/2014, 3:49 PM

## 2014-09-08 NOTE — Plan of Care (Signed)
Problem: Alteration in thought process Goal: STG-Patient is able to sleep at least 6 hours per night Outcome: Completed/Met Date Met:  09/08/14

## 2014-09-08 NOTE — Plan of Care (Signed)
Problem: Alteration in thought process Goal: LTG-Patient behavior demonstrates decreased signs psychosis (Patient behavior demonstrates decreased signs of psychosis to the point the patient is safe to return home and continue treatment in an outpatient setting.) Outcome: Completed/Met Date Met:  09/08/14

## 2014-09-08 NOTE — Progress Notes (Signed)
[facility] PROGRESS NOTE  Diane Diane [name] FBP:[id number] DOB: [birth year] DOA: 09/06/2014 PCP: Diane Blackbird, MD  Brief summary  Diane Diane [name] is an 63 y.o. female with PMH of Crohn's disease, endometriosis and osteoporosis who was recently admitted 08/28/14-08/30/14 for evaluation of altered mental status/acute encephalopathy. Diane mental status changes were thought to be secondary to polypharmacy and steroid effects, as the patient had been started on prednisone approximately 2 weeks prior to that admission for treatment of a Crohn's flare. She also suffered from a fall at the time of admission, with loss of consciousness x 4-5 minutes and had a witnessed grand mal seizure. A CT scan of Diane head was negative and EEGs done on 08/29/14 and11/4/15 were also negative. An MRI of the brain also done on 08/29/14 showed no acute intracranial abnormality, mass or etiology of seizures. Diane seizure was felt to be provoked by Diane fall and no further diagnostic evaluation was recommended by neurology, who consulted during Diane previous hospital stay. Patient's family brought Diane back to the hospital for further evaluation of increased confusion on 09/05/14 and she was admitted to [facility]. Dr. Shea [name], requested transfer to the medical service for further evaluation. Upon evaluation, Diane through were very disorganized with frank paranoia, and auditory hallucinations (of Diane [name] screaming). When asked about who she lives with, she reported "Diane Diane [name]" and that she thinks he "died yesterday" because she "heard him screaming too". When asked if she knows why she is in the hospital, she tells me that is because Diane step-son is "trying to take Diane out". Thinks people have given Diane "smack". She had no prior psychiatric issues. She has had angry outbursts, delusions, and agitation at night with inability to sleep/rest. I contacted Dr. Perley [name] office to ascertain the timeline/course of medication changes. The patient was  last seen by Dr. Watt [name] 08/03/14 at which time she was placed on prednisone, and hydrocodone was also added to address pain. On 08/11/14, Dr. Watt [name] noted that she "wasn't sleeping well" at a dose of 40 mg of prednisone. Ativan added to address this. The dose of prednisone was increased to 60 mg on 08/14/14. On 08/23/14, Dr. Perley [name] office notes indicated that she was beginning to have mental status issues, and the prednisone was beginning to be tapered/Ativan was increased, but the patient stopped taking Diane prednisone on Diane own 08/28/14.  She was tapered off prednisone finally on 10/29.     Assessment/Plan  Toxic encephalopathy secondary to polypharmacy / Steroid-induced psychosis / delirium  Severe.  Likely had steroid-induced psychosis that unmasked an underlying delusional disorder or bipolar disorder.  Still having hallucinations and delusions, but improved from yesterday.    CXR neg  UA neg  UDS neg  Repeat MRI unchanged, stable, no acute problems  EEG without epileptiform activity  TSH WNL, RPR NR, HIV NR  Continue haldol 63m po BID with cogentin 0.248mpo prn for EPS  Appreciate Psychiatric assistance   History of seizure  EEG repeated and no evidence of epileptiform activity.   Crohn's disease  Discontinue all medications except for mercaptopurine.  Appreciate GI assistance:  Continue 6-MP without additional medications for now  Diet:  regular Access:  PIV IVF:  off Proph:  SCDs  Code Status: full Family Communication: patient, Diane husband, and Diane Heritageher Diane [name], latter by phone Disposition Plan: possibly tomorrow if she is more redirectable from Diane delusions and hallucinations with 24 hour supervision.    Consultants:  Psychiatry  Gastroenterology  Procedures:  EEG:  Neg for epileptiform activity  MRI brain:  No acute abnl  Antibiotics:  none   HPI/Subjective:  Patient feels thinking is clearer.  Admits she is having auditory  hallucinations and recognizes that Diane thinking is not completely back to normal.  Asks Diane husband to verify that what she is experiencing is real or not real.  Still digressed to talk about being on TV today, but did not talk about drug rings or jail today.     Objective: Filed Vitals:   09/07/14 0543 09/07/14 1428 09/07/14 2110 09-21-14 0636  BP: 118/59 103/60 103/58 90/52  Pulse: 98 108  86  Temp: 98.8 F (37.1 C) 98.7 F (37.1 C) 98.7 F (37.1 C) 98.6 F (37 C)  TempSrc: Oral Oral Oral Oral  Resp: 16 18 20 18   Height:      Weight:      SpO2: 96% 99% 98% 96%    Intake/Output Summary (Last 24 hours) at 09/21/2014 1319 Last data filed at 09/21/14 1225  Gross per 24 hour  Intake    240 ml  Output    450 ml  Net   -210 ml   Filed Weights   09/06/14 1536  Weight: 64.2 kg (141 lb 8.6 oz)    Exam:   General:  WF, No acute distress, thinking more organizes and less tangential.  No obvious hallucinations during exam, awake and alert during interview  HEENT:  NCAT, MMM  Cardiovascular:  RRR, nl S1, S2 no mrg, 2+ pulses, warm extremities  Respiratory:  CTAB, no increased WOB  Abdomen:   NABS, soft, NT/ND  MSK:   Normal tone and bulk, no LEE  Neuro:  CN II-XII grossly intact, strength 5/5, sensation intact to light touch  Data Reviewed: Basic Metabolic Panel:  Recent Labs Lab 09/02/14 1457 09/03/14 1824 09/06/14 1620  NA 139 145 137  K 3.9 4.0 3.7  CL 104 111 102  CO2 24 21 20   GLUCOSE 101* 84 84  BUN 7 7 10   CREATININE 0.78 0.55 0.62  CALCIUM 9.2 8.4 8.9   Liver Function Tests:  Recent Labs Lab 09/06/14 1620  AST 27  ALT 26  ALKPHOS 83  BILITOT 0.5  PROT 7.0  ALBUMIN 3.2*   No results for input(s): LIPASE, AMYLASE in the last 168 hours. No results for input(s): AMMONIA in the last 168 hours. CBC:  Recent Labs Lab 09/02/14 1457 09/03/14 1824 09/06/14 1620  WBC 7.9 7.5 7.2  NEUTROABS 6.0 5.2 5.1  HGB 11.7* 12.0 11.4*  HCT 36.3 38.6 35.6*   MCV 89.2 93.7 90.8  PLT 298 259 359   Cardiac Enzymes:  Recent Labs Lab 09/03/14 1824  TROPONINI <0.30   BNP (last 3 results)  Recent Labs  08/28/14 1630  PROBNP 86.3   CBG: No results for input(s): GLUCAP in the last 168 hours.  No results found for this or any previous visit (from the past 240 hour(s)).   Studies: Mr [name] Contrast  09/21/14   CLINICAL DATA:  Delirium. Prior head injury with loss of consciousness due to grand mal seizure.  EXAM: MRI HEAD WITHOUT AND WITH CONTRAST  TECHNIQUE: Multiplanar, multiecho pulse sequences of the brain and surrounding structures were obtained without and with intravenous contrast.  CONTRAST:  67m MULTIHANCE GADOBENATE DIMEGLUMINE 529 MG/ML IV SOLN  COMPARISON:  08/29/2014  FINDINGS: There is no evidence of acute infarct, intracranial hemorrhage, mass, midline shift, or extra-axial fluid collection. Ventricles and sulci are normal. Several small  foci of T2 hyperintensity in the periventricular white matter are unchanged and nonspecific but may reflect minimal chronic small vessel ischemic disease, less than is often seen in patients of this age. There is no abnormal enhancement.  Orbits are unremarkable. Paranasal sinuses and mastoid air cells are clear. Major intracranial vascular flow voids are preserved.  IMPRESSION: Unchanged appearance of the brain. No acute intracranial abnormality or mass.   Electronically Signed   By: Logan Bores   On: 09/08/2014 08:03    Scheduled Meds: . folic acid  1 mg Oral Daily  . haloperidol  2 mg Oral BID  . lactose free nutrition  237 mL Oral Q24H  . mercaptopurine  50 mg Oral Daily  . 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 If 7PM-7AM, please contact night-coverage at [email or web address], password [facility] 09/08/2014, 1:19 PM  LOS: 2 days

## 2014-09-09 MED ORDER — HALOPERIDOL 2 MG PO TABS: 2.0000 mg | ORAL_TABLET | Freq: Two times a day (BID) | ORAL | Status: DC

## 2014-09-09 NOTE — Progress Notes (Signed)
[facility] PROGRESS NOTE Subjective Pt is doing better. Eating breakfast without problems.  Objective: Vital signs in last 24 hours: Temp:  [98.2 F (36.8 C)-98.5 F (36.9 C)] 98.2 F (36.8 C) (11/07 0446) Pulse Rate:  [87-98] 87 (11/07 0446) Resp:  [18] 18 (11/07 0446) BP: (95-106)/(56-61) 97/56 mmHg (11/07 0446) SpO2:  [98 %-100 %] 98 % (11/07 0446) Last BM Date: 09/07/14  Intake/Output from previous day: 11/06 0701 - 11/07 0700 In: -  Out: 100 [Urine:100] Intake/Output this shift:    PE: General--no distress  Abdomen--soft and nontender  Lab Results:  Recent Labs  09/06/14 1620  WBC 7.2  HGB 11.4*  HCT 35.6*  PLT 359   BMET  Recent Labs  09/06/14 1620  NA 137  K 3.7  CL 102  CO2 20  CREATININE 0.62   LFT  Recent Labs  09/06/14 1620  PROT 7.0  AST 27  ALT 26  ALKPHOS 83  BILITOT 0.5   PT/INR No results for input(s): LABPROT, INR in the last 72 hours. PANCREAS No results for input(s): LIPASE in the last 72 hours.       Studies/Results: Mr [name] Contrast  09/08/2014   CLINICAL DATA:  Delirium. Prior head injury with loss of consciousness due to grand mal seizure.  EXAM: MRI HEAD WITHOUT AND WITH CONTRAST  TECHNIQUE: Multiplanar, multiecho pulse sequences of the brain and surrounding structures were obtained without and with intravenous contrast.  CONTRAST:  17m MULTIHANCE GADOBENATE DIMEGLUMINE 529 MG/ML IV SOLN  COMPARISON:  08/29/2014  FINDINGS: There is no evidence of acute infarct, intracranial hemorrhage, mass, midline shift, or extra-axial fluid collection. Ventricles and sulci are normal. Several small foci of T2 hyperintensity in the periventricular white matter are unchanged and nonspecific but may reflect minimal chronic small vessel ischemic disease, less than is often seen in patients of this age. There is no abnormal enhancement.  Orbits are unremarkable. Paranasal sinuses and mastoid air cells are clear. Major  intracranial vascular flow voids are preserved.  IMPRESSION: Unchanged appearance of the brain. No acute intracranial abnormality or mass.   Electronically Signed   By: ALogan Bores  On: 09/08/2014 08:03    Medications: I have reviewed the patient's current medications.  Assessment/Plan: 1. Crohns. Would send home on the 6 MP and avoid steroids given recent psychosis, and f/u with Dr MWatt Climesin next week or so.   Diane [name],Diane [name] 09/09/2014, 8:04 AM

## 2014-09-09 NOTE — Discharge Summary (Signed)
Physician Discharge Summary  EARLEEN AOUN OZH:[id number] DOB: January 25, 1951 DOA: 09/06/2014  PCP: Antony Blackbird, MD  Admit date: 09/06/2014 Discharge date: 09/09/2014  Recommendations for Outpatient Follow-up:  1. Dr. Watt Climes in 1-2 weeks  2. Psychiatry ASAP 3. Continue haldol pending psychiatry follow up 4. PCP in 1 week to determine if she is safe to be alone.  24 hour supervision pending reevaluation.    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 female with PMH of Crohn's disease, endometriosis and osteoporosis who presented with altered mental status.  The patient was seen by Dr. Watt Climes for presumptive Crohn's flare on 08/03/14 at which time she was placed on prednisone and hydrocodone. On 08/11/14, Dr. Watt Climes noted that she "wasn't sleeping well" and added Ativan. The dose of prednisone was increased to 60 mg on 08/14/14. On 08/23/14, Dr. Perley Jain office notes indicated that she was beginning to have confusion so her prednisone was tapered and ativan increased.  A few days later, she suffered a fall and had a LOC of 4-5 minutes followed by a tonic-clonic seizure.  She was admitted 08/28/14-08/30/14 for evaluation of altered mental status/acute encephalopathy and seizure.  A CT scan of her head was negative, EEGs on 08/29/14 and11/4/15 were negative, MRI of the brain on 08/29/14 showed no acute intracranial abnormality, mass or etiology of  seizures. Her seizure was felt to be provoked by her fall and no further diagnostic evaluation was recommended by neurology.  After discharge, despite completing her steroid taper on 10/29, she became increasingly confused and agitated with pressured speech, thoughts that she and her family were on TV, nonsensical behaviors and comments.  She was admitted to [facility] but subsequently transferred to the medical service for further treatment of presumptive steroids-induced psychosis.   Hospital Course:   Toxic encephalopathy secondary to polypharmacy, steroid-induced psychosis, severe. Upon initial evaluation, her through were very disorganized with frank paranoia, auditory and visual hallucinations.  When asked about who she lives with, she reported "Liliane Channel" and that she stated she thought he "died yesterday" because she "heard him screaming too." When asked if she knows why she is in the hospital, she reported that her step-son was "trying to take her out". TShe had angry outbursts, delusions, and agitation at night with inability to sleep/rest.  Evaluation for other causes of hallucinations and psychosis was unrevealing.  CXR neg, UA neg, UDS neg, MRI unchanged, EEG without epileptiform activity, TSH WNL, RPR NR, HIV NR.  She was seen by psychiatry who recommended haldol 20m po BID with cogentin 0.216mpo prn for EPS.  She tolerated haldol well.  Over the course of the subsequent three days, she had marked improvement in her mentation.  She was able to identify when she may be hallucinating and execute strategies to confirm her suspicions.  She has a family history of suicides on one side and on her mother's side, severe depression which required  electroshock therapy.  Her daughter reported that for many years, her mother has intermittently exhibited some delusional and paranoid thoughts.  She may have an underlying delusional disorder or manic-depression that intermittently interferes with her ability to  function normally and should follow up with psychiatry for ongoing counseling and management as indicated.    History of seizure, EEG repeated and no evidence of epileptiform activity.  Crohn's disease, seen by GI who recommended NO further steroids and starting mercaptopurine with folic acid.  She will follow up with them in clinic in 1-2 weeks.  Tolerated a regular diet without diarrhea or abdominal pain.  Consultants:  Psychiatry  Gastroenterology  Procedures:  EEG: Neg for epileptiform activity  MRI brain: No acute abnl  Antibiotics:  none Discharge Exam: Filed Vitals:   09/09/14 0446  BP: 97/56  Pulse: 87  Temp: 98.2 F (36.8 C)  Resp: 18   Filed Vitals:   09/08/14 0636 09/08/14 1513 09/08/14 2058 09/09/14 0446  BP: 90/52 95/61 106/56 97/56  Pulse: 86 98 95 87  Temp: 98.6 F (37 C) 98.3 F (36.8 C) 98.5 F (36.9 C) 98.2 F (36.8 C)  TempSrc: Oral Oral Oral   Resp: 18 18 18 18   Height:      Weight:      SpO2: 96% 98% 100% 98%     General: WF, No acute distress, thinking more organized and was able to self-identify a hallucination  HEENT: NCAT, MMM  Cardiovascular: RRR, nl S1, S2 no mrg, 2+ pulses, warm extremities  Respiratory: CTAB, no increased WOB  Abdomen: NABS, soft, NT/ND  MSK: Normal tone and bulk, no LEE  Neuro: CN II-XII grossly intact, strength 5/5, sensation intact to light touch  Discharge Instructions      Discharge Instructions    Call MD for:  difficulty breathing, headache or visual disturbances    Complete by:  As directed      Call MD for:  extreme fatigue    Complete by:  As directed      Call MD for:  hives    Complete by:  As directed      Call MD for:  persistant dizziness or light-headedness    Complete by:  As directed      Call MD for:  persistant nausea and vomiting    Complete by:  As directed      Call MD for:  severe uncontrolled pain    Complete by:  As directed      Call MD for:  temperature  >100.4    Complete by:  As directed      Diet general    Complete by:  As directed      Discharge instructions    Complete by:  As directed   You were hospitalized with psychosis from taking steroids.  Please avoid steroids in the future if possible.  Start haldol twice a day until you can follow up with a psychiatrist for further evaluation.  Please have 24 hour supervision until your primary care doctor states it is okay to be alone.  Please stop your hydrocodone and lorazepam and your Lisbeth Ply for now.  Talk to your primary care doctor about your medications, some of which may be restarted at a later time when your thinking has returned completely to normal.     Driving Restrictions    Complete by:  As directed   No driving, operating heavy machinery, babysitting until cleared by your primary care doctor or psychiatrist  Increase activity slowly    Complete by:  As directed             Medication List    STOP taking these medications        cetirizine 10 MG tablet  Commonly known as:  ZYRTEC     LORazepam 1 MG tablet  Commonly known as:  ATIVAN     mesalamine 0.375 G 24 hr capsule  Commonly known as:  APRISO     TOVIAZ 8 MG Tb24 tablet  Generic drug:  fesoterodine      TAKE these medications        CALCIUM + D3 PO  Take 2 tablets by mouth daily.     cholecalciferol 1000 UNITS tablet  Commonly known as:  VITAMIN D  Take 1,000 Units by mouth daily.     fluticasone 50 MCG/ACT nasal spray  Commonly known as:  FLONASE  Place 1 spray into both nostrils daily as needed for allergies (allergies).     folic acid 1 MG tablet  Commonly known as:  FOLVITE  Take 1 mg by mouth daily.     haloperidol 2 MG tablet  Commonly known as:  HALDOL  Take 1 tablet (2 mg total) by mouth 2 (two) times daily.     mercaptopurine 50 MG tablet  Commonly known as:  PURINETHOL  Take 50 mg by mouth daily. Give on an empty stomach 1 hour before or 2 hours after meals. Caution: Chemotherapy.      PROBIOTIC DAILY PO  Take 1 tablet by mouth daily. "ULTRAFLORA BRAND"     SYSTANE OP  Place 2 drops into both eyes 4 (four) times daily as needed (dry eyes).       Follow-up Information    Follow up with FULP, CAMMIE, MD. Schedule an appointment as soon as possible for a visit in 1 week.   Specialty:  Family Medicine   Contact information:   [place] [address] [phone]       Follow up with [facility], MD. Schedule an appointment as soon as possible for a visit in 1 week.   Specialty:  Gastroenterology   Contact information:   [address], [place] [address] [phone]       Follow up with psychiatry.   Why:  as soon as possible       The results of significant diagnostics from this hospitalization (including imaging, microbiology, ancillary and laboratory) are listed below for reference.    Significant Diagnostic Studies: Dg Chest 2 View  08/28/2014   CLINICAL DATA:  Altered mental status. Patient has been on multiple medications in the last month and has not slept in 3 days. No complaints of chest pain or shortness of breath.  EXAM: CHEST  2 VIEW  COMPARISON:  04/25/2014.  FINDINGS: Cardiac silhouette is normal in size. Normal mediastinal and hilar contours. Clear lungs. No pleural effusion or pneumothorax.  Bony thorax is intact.  IMPRESSION: No active cardiopulmonary disease.   Electronically Signed   By: Lajean Manes M.D.   On: 08/28/2014 17:19   Ct Head Wo Contrast  08/28/2014   CLINICAL DATA:  Fall, injury, altered mental status, personal history of Crohn's disease  EXAM: CT HEAD WITHOUT CONTRAST  CT CERVICAL SPINE WITHOUT CONTRAST  TECHNIQUE: Multidetector CT imaging of the head and cervical spine was performed following the standard protocol without intravenous contrast. Multiplanar CT image reconstructions of the cervical spine were  also generated.  COMPARISON:  None  FINDINGS: CT HEAD FINDINGS  Normal  ventricular morphology.  No midline shift or mass effect.  Normal appearance of brain parenchyma.  No intracranial hemorrhage, mass lesion, or evidence acute infarction.  No extra-axial fluid collections.  Bones and sinuses unremarkable.  CT CERVICAL SPINE FINDINGS  Visualized skullbase intact.  Lung apices clear.  Prevertebral soft tissues normal thickness.  Disc space narrowing with endplate spur formation at C4-C5 through C6-C7.  Vertebral body heights maintained without fracture or bone destruction.  Minimal retrolisthesis at C5-C6 and C6-C7.  Multilevel facet degenerative changes of the cervical spine.  Uncovertebral spurs encroach upon the cervical neural foramina bilaterally at C5-C6 and to a lesser degree C6-C7.  Soft tissues unremarkable.  IMPRESSION: No acute intracranial abnormalities.  Multilevel degenerative disc and facet disease changes of the cervical spine as above.  No acute cervical spine abnormalities.   Electronically Signed   By: Lavonia Dana M.D.   On: 08/28/2014 17:18   Ct Cervical Spine Wo Contrast  08/28/2014   CLINICAL DATA:  Fall, injury, altered mental status, personal history of Crohn's disease  EXAM: CT HEAD WITHOUT CONTRAST  CT CERVICAL SPINE WITHOUT CONTRAST  TECHNIQUE: Multidetector CT imaging of the head and cervical spine was performed following the standard protocol without intravenous contrast. Multiplanar CT image reconstructions of the cervical spine were also generated.  COMPARISON:  None  FINDINGS: CT HEAD FINDINGS  Normal ventricular morphology.  No midline shift or mass effect.  Normal appearance of brain parenchyma.  No intracranial hemorrhage, mass lesion, or evidence acute infarction.  No extra-axial fluid collections.  Bones and sinuses unremarkable.  CT CERVICAL SPINE FINDINGS  Visualized skullbase intact.  Lung apices clear.  Prevertebral soft tissues normal thickness.  Disc space narrowing with endplate spur formation at C4-C5 through C6-C7.  Vertebral body  heights maintained without fracture or bone destruction.  Minimal retrolisthesis at C5-C6 and C6-C7.  Multilevel facet degenerative changes of the cervical spine.  Uncovertebral spurs encroach upon the cervical neural foramina bilaterally at C5-C6 and to a lesser degree C6-C7.  Soft tissues unremarkable.  IMPRESSION: No acute intracranial abnormalities.  Multilevel degenerative disc and facet disease changes of the cervical spine as above.  No acute cervical spine abnormalities.   Electronically Signed   By: Lavonia Dana M.D.   On: 08/28/2014 17:18   Mr Jeri Cos [facility] Contrast  09/08/2014   CLINICAL DATA:  Delirium. Prior head injury with loss of consciousness due to grand mal seizure.  EXAM: MRI HEAD WITHOUT AND WITH CONTRAST  TECHNIQUE: Multiplanar, multiecho pulse sequences of the brain and surrounding structures were obtained without and with intravenous contrast.  CONTRAST:  55m MULTIHANCE GADOBENATE DIMEGLUMINE 529 MG/ML IV SOLN  COMPARISON:  08/29/2014  FINDINGS: There is no evidence of acute infarct, intracranial hemorrhage, mass, midline shift, or extra-axial fluid collection. Ventricles and sulci are normal. Several small foci of T2 hyperintensity in the periventricular white matter are unchanged and nonspecific but may reflect minimal chronic small vessel ischemic disease, less than is often seen in patients of this age. There is no abnormal enhancement.  Orbits are unremarkable. Paranasal sinuses and mastoid air cells are clear. Major intracranial vascular flow voids are preserved.  IMPRESSION: Unchanged appearance of the brain. No acute intracranial abnormality or mass.   Electronically Signed   By: ALogan Bores  On: 09/08/2014 08:03   Mr Brain W Wo Contrast  08/29/2014   CLINICAL DATA:  New onset seizure.  Altered mental status, agitation, hallucinations, and behavioral changes for the past 3 days. Recent fall with head injury and loss of consciousness followed by a grand mal seizure.  EXAM: MRI  HEAD WITHOUT AND WITH CONTRAST  TECHNIQUE: Multiplanar, multiecho pulse sequences of the brain and surrounding structures were obtained without and with intravenous contrast.  CONTRAST:  48m MULTIHANCE GADOBENATE DIMEGLUMINE 529 MG/ML IV SOLN  COMPARISON:  Head CT 08/28/2014  FINDINGS: Dedicated thin section imaging through the temporal lobes demonstrates normal volume and signal of the hippocampi. There is no evidence of heterotopia.  There is no evidence of acute infarct, intracranial hemorrhage, mass, midline shift, or extra-axial fluid collection. Ventricles and sulci are within normal limits for age. There are a few small foci of T2 hyperintensity in the periventricular white matter bilaterally. There is no abnormal enhancement.  Orbits are unremarkable. Paranasal sinuses and mastoid air cells are clear. Major intracranial vascular flow voids are preserved.  IMPRESSION: 1. No acute intracranial abnormality, mass, or etiology of seizures identified. 2. Small foci of periventricular white matter T2 signal abnormality, nonspecific but may reflect minimal chronic small vessel ischemic disease.   Electronically Signed   By: ALogan Bores  On: 08/29/2014 20:18   Dg Chest Port 1 View  09/03/2014   CLINICAL DATA:  Fever and hypotension.  EXAM: PORTABLE CHEST - 1 VIEW  COMPARISON:  08/28/2014  FINDINGS: Single view of the chest was obtained. A few prominent linear densities at the medial right lung base. Otherwise, the lungs are clear. Heart and mediastinum are within normal limits. The trachea is midline. No acute bone abnormality.  IMPRESSION: Few linear densities at the medial right lung base. This could represent atelectasis or possibly normal lung markings. No significant airspace disease.   Electronically Signed   By: AMarkus DaftM.D.   On: 09/03/2014 18:39    Microbiology: No results found for this or any previous visit (from the past 240 hour(s)).   Labs: Basic Metabolic Panel:  Recent Labs Lab  09/02/14 1457 09/03/14 1824 09/06/14 1620  NA 139 145 137  K 3.9 4.0 3.7  CL 104 111 102  CO2 24 21 20   GLUCOSE 101* 84 84  BUN 7 7 10   CREATININE 0.78 0.55 0.62  CALCIUM 9.2 8.4 8.9   Liver Function Tests:  Recent Labs Lab 09/06/14 1620  AST 27  ALT 26  ALKPHOS 83  BILITOT 0.5  PROT 7.0  ALBUMIN 3.2*   No results for input(s): LIPASE, AMYLASE in the last 168 hours. No results for input(s): AMMONIA in the last 168 hours. CBC:  Recent Labs Lab 09/02/14 1457 09/03/14 1824 09/06/14 1620  WBC 7.9 7.5 7.2  NEUTROABS 6.0 5.2 5.1  HGB 11.7* 12.0 11.4*  HCT 36.3 38.6 35.6*  MCV 89.2 93.7 90.8  PLT 298 259 359   Cardiac Enzymes:  Recent Labs Lab 09/03/14 1824  TROPONINI <0.30   BNP: BNP (last 3 results)  Recent Labs  08/28/14 1630  PROBNP 86.3   CBG: No results for input(s): GLUCAP in the last 168 hours.  Time coordinating discharge: 45 minutes  Signed:  Rexanna Louthan  [facility] 09/09/2014, 10:42 AM

## 2014-09-20 ENCOUNTER — Encounter: Payer: Self-pay | Admitting: Diagnostic Neuroimaging

## 2014-10-02 ENCOUNTER — Ambulatory Visit (INDEPENDENT_AMBULATORY_CARE_PROVIDER_SITE_OTHER): Payer: 59

## 2014-10-03 ENCOUNTER — Ambulatory Visit (INDEPENDENT_AMBULATORY_CARE_PROVIDER_SITE_OTHER): Payer: 59 | Admitting: Emergency Medicine

## 2014-10-03 VITALS — BP 120/70 | HR 100 | Temp 98.0°F | Resp 16 | Ht 62.0 in | Wt 142.0 lb

## 2014-10-03 DIAGNOSIS — F29 Unspecified psychosis not due to a substance or known physiological condition: Secondary | ICD-10-CM

## 2014-10-03 NOTE — Progress Notes (Signed)
[facility] [address], [place] [address] [phone]  Date:  10/03/2014   Name:  Diane [name]   DOB:  April 19, 1951   MRN:  [id number]  PCP:  Antony Blackbird, MD    Chief Complaint: Medication Problem   History of Present Illness:  Diane [name] is a 63 y.o. very pleasant female patient who presents with the following:  Recent admission to [facility] after acute psychotic episode related to high dose prednisone. Has come in today to be weaned off the haldol.  Her family doctor cut it from 2 mg BID to 1 mg BID. She is no longer experiencing the hallucinations or delusions and is functional from an ADL standpoint. Denies other complaint or health concern today.    Patient Active Problem List   Diagnosis Date Noted  . History of seizure 09/06/2014  . 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 Crohn's disease 09/06/2014  . Toxic encephalopathy due to polypharmacy 09/06/2014  . Polypharmacy 09/06/2014  . Delirium due to multiple etiologies, persistent, mixed level of activity   . Psychotic disorder 09/05/2014  . Psychoses   . Malnutrition of moderate degree 08/29/2014  . Syncope 08/28/2014  . Steroid-induced psychosis with complication 43/32/9518  . Mania 08/28/2014  . Grand mal seizure 08/28/2014    Past Medical History  Diagnosis Date  . History of small bowel obstruction     2011--  PARITAL --  RESOLVED -- NO SURGICAL INTERVENTION  . SUI (stress urinary incontinence, female)   . Pelvic prolapse   . Osteoporosis   . History of endometriosis   . PONV (postoperative nausea and vomiting)     AND HARD TO WAKE  . Crohn's disease   . Allergic rhinitis   . Arthritis   . H/O bladder problems   . Crohn disease     Past Surgical History  Procedure Laterality Date  . Laparoscopy/ lysis adhesions/ left salpingoophorectomy/ right ovarian cystectomy  06-08-2000  . Abdominal hysterectomy  1980  . Benign excision right breast cyst  1996  . Exploratory  laparotomy  EARLY 1970'S    INFERLITITY  . Pubovaginal sling N/A 06/13/2013    Procedure: PELVIC FLOOR REPAIR  ,  WITH  UPHOLD  , ANTERIOR VAULT WITH KELLY PLICATION WITH LYNX SLING;  Surgeon: Ailene Rud, MD;  Location: [facility];  Service: Urology;  Laterality: N/A;  . Cystoscopy N/A 06/13/2013    Procedure: CYSTOSCOPY;  Surgeon: Ailene Rud, MD;  Location: [facility];  Service: Urology;  Laterality: N/A;  . Anterior and posterior repair  06/13/2013    Procedure:  AUGUEMENTED  RECTOCELE REPAIR WITH XENFORM;  Surgeon: Ailene Rud, MD;  Location: [facility];  Service: Urology;;    History  Substance Use Topics  . Smoking status: Former Smoker -- 0.75 packs/day for 40 years    Types: Cigarettes  . Smokeless tobacco: Never Used  . Alcohol Use: 4.2 oz/week    7 Glasses of wine per week    Family History  Problem Relation Age of Onset  . Dementia Mother     Allergies  Allergen Reactions  . Celebrex [Celecoxib] Anaphylaxis  . Prednisone Other (See Comments)    Severe psychosis requiring hospitalization.  Avoid use if possible.    Edd Fabian [Risedronate Sodium] Other (See Comments)    GI UPSET  . Albuterol   . Ammonium-Containing Compounds   . Boniva [Ibandronic Acid] Other (See Comments)  GI UPSET  . Chantix [Varenicline] Other (See Comments)    MOOD CHANGE  . Fosamax [Alendronate Sodium] Other (See Comments)    GI UPSET  . Imuran [Azathioprine]   . Macrodantin [Nitrofurantoin Macrocrystal]   . Neosporin [Neomycin-Bacitracin Zn-Polymyx] Swelling and Other (See Comments)    REDNESS  . Nitrofuran Derivatives Other (See Comments)    CHILLS AND FEVER  . Other     Pt states allergic to 3 more medicines and doesn't know name of them.  . Penicillins   . Prolia [Denosumab]   . Tobradex [Tobramycin-Dexamethasone] Other (See Comments)    SEVERE EYE IRRIATION  . Vagifem [Estradiol]     Medication list has  been reviewed and updated.  Current Outpatient Prescriptions on File Prior to Visit  Medication Sig Dispense Refill  . Calcium Carb-Cholecalciferol (CALCIUM + D3 PO) Take 2 tablets by mouth daily.    . cholecalciferol (VITAMIN D) 1000 UNITS tablet Take 1,000 Units by mouth daily.    . fluticasone (FLONASE) 50 MCG/ACT nasal spray Place 1 spray into both nostrils daily as needed for allergies (allergies).     . folic acid (FOLVITE) 1 MG tablet Take 1 mg by mouth daily.    . haloperidol (HALDOL) 2 MG tablet Take 1 tablet (2 mg total) by mouth 2 (two) times daily. 60 tablet 0  . mercaptopurine (PURINETHOL) 50 MG tablet Take 50 mg by mouth daily. Give on an empty stomach 1 hour before or 2 hours after meals. Caution: Chemotherapy.    Diane [name] (SYSTANE OP) Place 2 drops into both eyes 4 (four) times daily as needed (dry eyes).    . Probiotic Product (PROBIOTIC DAILY PO) Take 1 tablet by mouth daily. "ULTRAFLORA BRAND"     No current facility-administered medications on file prior to visit.    Review of Systems:  As per HPI, otherwise negative.    Physical Examination: Filed Vitals:   10/03/14 0836  BP: 120/70  Pulse: 100  Temp: 98 F (36.7 C)  Resp: 16   Filed Vitals:   10/03/14 0836  Height: 5' 2"  (1.575 m)  Weight: 142 lb (64.411 kg)   Body mass index is 25.97 kg/(m^2). Ideal Body Weight: Weight in (lb) to have BMI = 25: 136.4  GEN: WDWN, NAD, Non-toxic, A & O x 3 HEENT: Atraumatic, Normocephalic. Neck supple. No masses, No LAD. Ears and Nose: No external deformity. CV: RRR, No M/G/R. No JVD. No thrill. No extra heart sounds. PULM: CTA B, no wheezes, crackles, rhonchi. No retractions. No resp. distress. No accessory muscle use. ABD: S, NT, ND, +BS. No rebound. No HSM. EXTR: No c/c/e NEURO Normal gait.  PSYCH: Normally interactive. Conversant. Not depressed or anxious appearing.  Calm demeanor.    Assessment and Plan: Resolved medication induced  psychosis Cut to 1 mg daily for week and then discontinue  Signed,  Ellison Carwin, MD

## 2014-10-03 NOTE — Patient Instructions (Signed)
Psychosis Psychosis refers to a severe lack of understanding with reality. During a psychotic episode, you are not able to think clearly. During a psychotic episode, your responses and emotions are inappropriate and do not coincide with what is actually happening. You often have false beliefs about what is happening or who you are (delusions), and you may see, hear, taste, smell, or feel things that are not present (hallucinations). Psychosis is usually a severe symptom of a very serious mental health (psychiatric) condition, but it can sometimes be the result of a medical condition. CAUSES   Psychiatric conditions, such as:  Schizophrenia.  Bipolar disorder.  Depression.  Personality disorders.  Alcohol or drug abuse.  Medical conditions, such as:  Brain injury.  Brain tumor.  Dementia.  Brain diseases, such as Alzheimer's, Parkinson's, or Huntington's disease.  Neurological diseases, such as epilepsy.  Genetic disorders.  Metabolic disorders.  Infections that affect the brain.  Certain prescription drugs.  Stroke. SYMPTOMS   Unable to think or speak clearly or respond appropriately.  Disorganized thinking (thoughts jump from one thought to another).  Severe inappropriate behavior.  Delusions may include:  A strong belief that is odd, unrealistic, or false.  Feeling extremely fearful or suspicious (paranoid).  Believing you are someone else, have high importance, or have an altered identity.  Hallucinations. DIAGNOSIS   Mental health evaluation.  Physical exam.  Blood tests.  Computerized magnetic scan (MRI) or other brain scans. TREATMENT  Your caregiver will recommend a course of treatment that depends on the cause of the psychosis. Treatment may include:  Monitoring and supportive care in the hospital.  Taking medicines (antipsychotic medicine) to reduce symptoms and balance chemicals in the brain.  Taking medicines to manage underlying  mental health conditions.  Therapy and other supportive programs outside of the hospital.  Treating an underlying medical condition. If the cause of the psychosis can be treated or corrected, the outlook is good. Without treatment, psychotic episodes can cause danger to yourself or others. Treatment may be short-term or lifelong. HOME CARE INSTRUCTIONS   Take all medicines as directed. This is important.  Use a pillbox or write down your medicine schedule to make sure you are taking them.  Check with your caregiver before using over-the-counter medicines, herbs, or supplements.  Seek individual and family support through therapy and mental health education (psychoeducation) programs. These will help you manage symptoms and side effects of medicines, learn life skills, and maintain a healthy routine.  Maintain a healthy lifestyle.  Exercise regularly.  Avoid alcohol and drugs.  Learn ways to reduce stress and cope with stress, such as yoga and meditation.  Talk about your feelings with family members or caregivers.  Make time for yourself to do things you enjoy.  Know the early warning signs of psychosis. Your caregiver will recommend steps to take when you notice symptoms such as:  Feeling anxious or preoccupied.  Having racing thoughts.  Changes in your interest in life and relationships.  Follow up with your caregivers for continued outpatient treatment as directed. SEEK MEDICAL CARE IF:   Medicines do not seem to be helping.  You hear voices telling you to do things.  You see, smell, or feel things that are not there.  You feel hopeless and overwhelmed.  You feel extremely fearful and suspicious that something will harm you.  You feel like you cannot leave your house.  You have trouble taking care of yourself.  You experience side effects of medicines, such as  changes in sleep patterns, dizziness, weight gain, restlessness, movement changes, muscle spasms, or  tremors. SEEK IMMEDIATE MEDICAL CARE IF:  Severe psychotic symptoms present a safety issue (such as an urge to hurt yourself or others). MAKE SURE YOU:   Understand these instructions.  Will watch your condition.  Will get help right away if you are not doing well or get worse. [place] of [employer]: [email or web address].[email or web address]/ Document Released: 04/09/2010 Document Revised: 01/12/2012 Document Reviewed: 04/09/2010 [facility] Patient Information 2015 [place], [place]. This information is not intended to replace advice given to you by your health care provider. Make sure you discuss any questions you have with your health care provider.

## 2014-10-19 ENCOUNTER — Encounter (HOSPITAL_COMMUNITY): Payer: Self-pay | Admitting: *Deleted

## 2015-09-26 ENCOUNTER — Other Ambulatory Visit: Payer: Self-pay | Admitting: Gastroenterology

## 2015-09-26 DIAGNOSIS — R1084 Generalized abdominal pain: Secondary | ICD-10-CM

## 2015-10-12 ENCOUNTER — Ambulatory Visit
Admission: RE | Admit: 2015-10-12 | Discharge: 2015-10-12 | Disposition: A | Discharge status: Home / Self Care | Payer: Medicare Other | Source: Ambulatory Visit | Attending: Gastroenterology | Admitting: Gastroenterology

## 2015-10-12 DIAGNOSIS — R1084 Generalized abdominal pain: Secondary | ICD-10-CM

## 2015-10-12 MED ORDER — IOPAMIDOL (ISOVUE-300) INJECTION 61%
100.0000 mL | Freq: Once | INTRAVENOUS | Status: AC | PRN
Start: 2015-10-12 — End: 2015-10-12
  Administered 2015-10-12: 100 mL via INTRAVENOUS

## 2015-11-02 ENCOUNTER — Other Ambulatory Visit: Payer: Self-pay

## 2015-11-02 DIAGNOSIS — Z1231 Encounter for screening mammogram for malignant neoplasm of breast: Secondary | ICD-10-CM

## 2015-12-14 ENCOUNTER — Ambulatory Visit
Admission: RE | Admit: 2015-12-14 | Discharge: 2015-12-14 | Disposition: A | Discharge status: Home / Self Care | Payer: Medicare Other | Source: Ambulatory Visit

## 2015-12-14 DIAGNOSIS — Z1231 Encounter for screening mammogram for malignant neoplasm of breast: Secondary | ICD-10-CM

## 2016-03-18 ENCOUNTER — Other Ambulatory Visit: Payer: Self-pay | Admitting: Gastroenterology

## 2016-03-18 DIAGNOSIS — R1084 Generalized abdominal pain: Secondary | ICD-10-CM

## 2016-03-24 ENCOUNTER — Ambulatory Visit
Admission: RE | Admit: 2016-03-24 | Discharge: 2016-03-24 | Disposition: A | Discharge status: Home / Self Care | Payer: Medicare Other | Source: Ambulatory Visit | Attending: Gastroenterology | Admitting: Gastroenterology

## 2016-03-24 DIAGNOSIS — R1084 Generalized abdominal pain: Secondary | ICD-10-CM

## 2016-03-24 MED ORDER — IOPAMIDOL (ISOVUE-300) INJECTION 61%: 100.0000 mL | Freq: Once | INTRAVENOUS | Status: DC | PRN

## 2016-11-11 ENCOUNTER — Other Ambulatory Visit: Payer: Self-pay | Admitting: Internal Medicine

## 2016-11-11 DIAGNOSIS — Z1231 Encounter for screening mammogram for malignant neoplasm of breast: Secondary | ICD-10-CM

## 2016-12-15 ENCOUNTER — Ambulatory Visit
Admission: RE | Admit: 2016-12-15 | Discharge: 2016-12-15 | Disposition: A | Discharge status: Home / Self Care | Payer: Medicare Other | Source: Ambulatory Visit | Attending: Internal Medicine | Admitting: Internal Medicine

## 2016-12-15 DIAGNOSIS — Z1231 Encounter for screening mammogram for malignant neoplasm of breast: Secondary | ICD-10-CM

## 2017-01-06 DIAGNOSIS — M160 Bilateral primary osteoarthritis of hip: Secondary | ICD-10-CM | POA: Insufficient documentation

## 2017-01-06 DIAGNOSIS — M170 Bilateral primary osteoarthritis of knee: Secondary | ICD-10-CM | POA: Insufficient documentation

## 2017-01-06 DIAGNOSIS — M19041 Primary osteoarthritis, right hand: Secondary | ICD-10-CM | POA: Insufficient documentation

## 2017-01-06 DIAGNOSIS — M47816 Spondylosis without myelopathy or radiculopathy, lumbar region: Secondary | ICD-10-CM | POA: Insufficient documentation

## 2017-01-06 DIAGNOSIS — M810 Age-related osteoporosis without current pathological fracture: Secondary | ICD-10-CM | POA: Insufficient documentation

## 2017-01-06 DIAGNOSIS — M19042 Primary osteoarthritis, left hand: Secondary | ICD-10-CM | POA: Insufficient documentation

## 2017-01-06 DIAGNOSIS — Z79899 Other long term (current) drug therapy: Secondary | ICD-10-CM | POA: Insufficient documentation

## 2017-01-06 NOTE — Progress Notes (Deleted)
Office Visit Note  Patient: Diane [name]             Date of Birth: 10/07/1951           MRN: [id number]             PCP: Antony Blackbird, MD Referring: Antony Blackbird, MD Visit Date: 01/15/2017 Occupation: @GUAROCC @    Subjective:  No chief complaint on file.   History of Present Illness: Diane [name] is a 66 y.o. female ***   Activities of Daily Living:  Patient reports morning stiffness for *** {minute/hour:19697}.   Patient {ACTIONS;DENIES/REPORTS:21021675::"Denies"} nocturnal pain.  Difficulty dressing/grooming: {ACTIONS;DENIES/REPORTS:21021675::"Denies"} Difficulty climbing stairs: {ACTIONS;DENIES/REPORTS:21021675::"Denies"} Difficulty getting out of chair: {ACTIONS;DENIES/REPORTS:21021675::"Denies"} Difficulty using hands for taps, buttons, cutlery, and/or writing: {ACTIONS;DENIES/REPORTS:21021675::"Denies"}   No Rheumatology ROS completed.   PMFS History:  Patient Active Problem List   Diagnosis Date Noted  . Age-related osteoporosis without current pathological fracture 01/06/2017  . High risk medication use 01/06/2017  . Primary osteoarthritis of both knees 01/06/2017  . Primary osteoarthritis of both hands 01/06/2017  . Spondylosis of lumbar region without myelopathy or radiculopathy 01/06/2017  . Bilateral primary osteoarthritis of hip 01/06/2017  . History of seizure 09/06/2014  . 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 Crohn's disease ([place]) 09/06/2014  . Toxic encephalopathy due to polypharmacy 09/06/2014  . Polypharmacy 09/06/2014  . Delirium due to multiple etiologies, persistent, mixed level of activity   . Psychotic disorder 09/05/2014  . Psychoses   . Malnutrition of moderate degree ([place]) 08/29/2014  . Syncope 08/28/2014  . 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 Mania ([place]) 08/28/2014  . [place] mal seizure ([place]) 08/28/2014    Past Medical History:  Diagnosis Date  . Allergic rhinitis   . Arthritis   . Crohn disease  ([place])   . Crohn's disease ([place])   . H/O bladder problems   . History of endometriosis   . History of small bowel obstruction    2011--  [facility] --  RESOLVED -- NO SURGICAL INTERVENTION  . Osteoporosis   . Pelvic prolapse   . PONV (postoperative nausea and vomiting)    AND HARD TO WAKE  . SUI (stress urinary incontinence, female)     Family History  Problem Relation Age of Onset  . Dementia Mother    Past Surgical History:  Procedure Laterality Date  . ABDOMINAL HYSTERECTOMY  1980  . ANTERIOR AND POSTERIOR REPAIR  06/13/2013   Procedure:  AUGUEMENTED  RECTOCELE REPAIR WITH XENFORM;  Surgeon: Ailene Rud, MD;  Location: [facility];  Service: Urology;;  . BENIGN EXCISION RIGHT BREAST CYST  1996  . BREAST EXCISIONAL BIOPSY Right 1996  . CYSTOSCOPY N/A 06/13/2013   Procedure: CYSTOSCOPY;  Surgeon: Ailene Rud, MD;  Location: [facility];  Service: Urology;  Laterality: N/A;  . EXPLORATORY LAPAROTOMY  EARLY 1970'S   INFERLITITY  . LAPAROSCOPY/ LYSIS ADHESIONS/ LEFT SALPINGOOPHORECTOMY/ RIGHT OVARIAN CYSTECTOMY  06-08-2000  . PUBOVAGINAL SLING N/A 06/13/2013   Procedure: PELVIC FLOOR REPAIR  ,  WITH  UPHOLD  , ANTERIOR VAULT WITH KELLY PLICATION WITH LYNX SLING;  Surgeon: Ailene Rud, MD;  Location[facility];  Service: Urology;  Laterality: N/A;   Social History   Social History Narrative  . No narrative on file     Objective: Vital Signs: There were no vitals taken for this visit.   Physical Exam   Musculoskeletal Exam: ***  CDAI Exam: No  CDAI exam completed.    Investigation: Findings:  April 2015:  CBC, phosphorus, magnesium, TSH, SPEP, PTH, calcium, rheumatoid factor and CCP were negative.  Sed rate was 23.    January 2015 x-ray of pelvis showed left hip joint moderate medial compartment narrowing  bone density report from [facility] from March 2016 which showed a T-score of -2 in the lumbar region  and -2.9 in the left femoral region.  The bone mass density comparison was there which was slightly improved, but no percentage of improvement was given.  Basically based on the numbers, she did slightly better or stable.    Patient states Prolia injection has caused her a crohns flare in the past / have discussed in the past we may want to consider Reclast in the future      Imaging: Mm Digital Screening Bilateral  Result Date: 12/15/2016 CLINICAL DATA:  Screening. EXAM: DIGITAL SCREENING BILATERAL MAMMOGRAM WITH CAD COMPARISON:  Previous exam(s). ACR Breast Density Category c: The breast tissue is heterogeneously dense, which may obscure small masses. FINDINGS: There are no findings suspicious for malignancy. Images were processed with CAD. IMPRESSION: No mammographic evidence of malignancy. A result letter of this screening mammogram will be mailed directly to the patient. RECOMMENDATION: Screening mammogram in one year. (Code:SM-B-01Y) BI-RADS CATEGORY  1: Negative. Electronically Signed   By: Ammie Ferrier M.D.   On: 12/15/2016 16:25    Speciality Comments: No specialty comments available.    Procedures:  No procedures performed Allergies: Celebrex [celecoxib]; Prednisone; Actonel [risedronate sodium]; Albuterol; Ammonium-containing compounds; Boniva [ibandronic acid]; Chantix [varenicline]; Fosamax [alendronate sodium]; Imuran [azathioprine]; Macrodantin [nitrofurantoin macrocrystal]; Neosporin [neomycin-bacitracin zn-polymyx]; Nitrofuran derivatives; Other; Penicillins; Prolia [denosumab]; Tobradex [tobramycin-dexamethasone]; and Vagifem [estradiol]   Assessment / Plan:     Visit Diagnoses: Crohn's disease of large intestine without complication ([place])  Age-related osteoporosis without current pathological fracture  High risk medication use  Primary osteoarthritis of both knees  Primary osteoarthritis of both hands  Bilateral primary osteoarthritis of hip  Spondylosis of  lumbar region without myelopathy or radiculopathy  History of seizure    Orders: No orders of the defined types were placed in this encounter.  No orders of the defined types were placed in this encounter.   Face-to-face time spent with patient was *** minutes. 50% of time was spent in counseling and coordination of care.  Follow-Up Instructions: No Follow-up on file.   Dahlila Pfahler, RT  Note - This record has been created using [employer].  Chart creation errors have been sought, but may not always  have been located. Such creation errors do not reflect on  the standard of medical care.

## 2017-01-15 ENCOUNTER — Ambulatory Visit: Payer: Self-pay | Admitting: Rheumatology

## 2017-02-11 ENCOUNTER — Other Ambulatory Visit: Payer: Self-pay | Admitting: Gastroenterology

## 2017-02-11 DIAGNOSIS — K50019 Crohn's disease of small intestine with unspecified complications: Secondary | ICD-10-CM

## 2017-02-27 ENCOUNTER — Ambulatory Visit
Admission: RE | Admit: 2017-02-27 | Discharge: 2017-02-27 | Disposition: A | Discharge status: Home / Self Care | Payer: Medicare Other | Source: Ambulatory Visit | Attending: Gastroenterology | Admitting: Gastroenterology

## 2017-02-27 DIAGNOSIS — K50019 Crohn's disease of small intestine with unspecified complications: Secondary | ICD-10-CM

## 2017-02-27 MED ORDER — IOHEXOL 300 MG/ML  SOLN: 20.0000 mL | Freq: Once | INTRAMUSCULAR | Status: DC | PRN

## 2017-02-27 MED ORDER — IOPAMIDOL (ISOVUE-300) INJECTION 61%: 100.0000 mL | Freq: Once | INTRAVENOUS | Status: DC | PRN

## 2017-03-25 ENCOUNTER — Emergency Department (HOSPITAL_COMMUNITY): Payer: Medicare Other

## 2017-03-25 ENCOUNTER — Encounter (HOSPITAL_COMMUNITY): Payer: Self-pay

## 2017-03-25 ENCOUNTER — Emergency Department (HOSPITAL_COMMUNITY)
Admission: EM | Admit: 2017-03-25 | Discharge: 2017-03-26 | Disposition: A | Discharge status: Home / Self Care | Payer: Medicare Other | Attending: Emergency Medicine | Admitting: Emergency Medicine

## 2017-03-25 DIAGNOSIS — Z79899 Other long term (current) drug therapy: Secondary | ICD-10-CM | POA: Diagnosis not present

## 2017-03-25 DIAGNOSIS — R0789 Other chest pain: Secondary | ICD-10-CM | POA: Diagnosis present

## 2017-03-25 DIAGNOSIS — K8020 Calculus of gallbladder without cholecystitis without obstruction: Secondary | ICD-10-CM | POA: Diagnosis not present

## 2017-03-25 DIAGNOSIS — Z87891 Personal history of nicotine dependence: Secondary | ICD-10-CM | POA: Diagnosis not present

## 2017-03-25 DIAGNOSIS — R072 Precordial pain: Secondary | ICD-10-CM | POA: Insufficient documentation

## 2017-03-25 DIAGNOSIS — R1010 Upper abdominal pain, unspecified: Secondary | ICD-10-CM | POA: Diagnosis not present

## 2017-03-25 LAB — BASIC METABOLIC PANEL
ANION GAP: 7 (ref 5–15)
BUN: 14 mg/dL (ref 6–20)
CALCIUM: 9.1 mg/dL (ref 8.9–10.3)
CHLORIDE: 103 mmol/L (ref 101–111)
CO2: 27 mmol/L (ref 22–32)
Creatinine, Ser: 0.9 mg/dL (ref 0.44–1.00)
GFR calc non Af Amer: >60 mL/min (ref >60)
Glucose, Bld: 113 mg/dL — ABNORMAL HIGH (ref 65–99)
Potassium: 3.5 mmol/L (ref 3.5–5.1)
SODIUM: 137 mmol/L (ref 135–145)

## 2017-03-25 LAB — CBC
HCT: 35.4 % — ABNORMAL LOW (ref 36.0–46.0)
HEMOGLOBIN: 12.3 g/dL (ref 12.0–15.0)
MCH: 36.2 pg — AB (ref 26.0–34.0)
MCHC: 34.7 g/dL (ref 30.0–36.0)
MCV: 104.1 fL — ABNORMAL HIGH (ref 78.0–100.0)
Platelets: 244 10*3/uL (ref 150–400)
RBC: 3.4 MIL/uL — AB (ref 3.87–5.11)
RDW: 15 % (ref 11.5–15.5)
WBC: 3.8 10*3/uL — AB (ref 4.0–10.5)

## 2017-03-25 LAB — LIPASE, BLOOD: LIPASE: 35 U/L (ref 11–51)

## 2017-03-25 LAB — HEPATIC FUNCTION PANEL
ALBUMIN: 3.7 g/dL (ref 3.5–5.0)
ALT: 51 U/L (ref 14–54)
AST: 45 U/L — AB (ref 15–41)
Alkaline Phosphatase: 79 U/L (ref 38–126)
BILIRUBIN TOTAL: 0.8 mg/dL (ref 0.3–1.2)
Bilirubin, Direct: 0.2 mg/dL (ref 0.1–0.5)
Indirect Bilirubin: 0.6 mg/dL (ref 0.3–0.9)
TOTAL PROTEIN: 7.4 g/dL (ref 6.5–8.1)

## 2017-03-25 LAB — I-STAT TROPONIN, ED: Troponin i, poc: 0 ng/mL (ref 0.00–0.08)

## 2017-03-25 MED ORDER — GI COCKTAIL ~~LOC~~
30.0000 mL | Freq: Once | ORAL | Status: AC
  Administered 2017-03-26: 30 mL via ORAL
  Filled 2017-03-25: qty 30

## 2017-03-25 NOTE — ED Triage Notes (Signed)
Pt is now vomiting in triage

## 2017-03-25 NOTE — ED Triage Notes (Signed)
Pt complains of pressure like chest pain for two hours, she states it happened last night and lasted about two hours but tonight its different and worse Pt also complains of being nauseated denies vomiting

## 2017-03-25 NOTE — ED Provider Notes (Addendum)
[facility] DEPT Provider Note   CSN: [id number] Arrival date & time: 03/25/17  2227  By signing my name below, I, [name], attest that this documentation has been prepared under the direction and in the presence of [name], MD. Electronically Signed: [name], ED Scribe. 03/26/17. 1:43 AM.  History   Chief Complaint Chief Complaint  Patient presents with  . Chest Pain   The history is provided by the patient and the spouse. No language interpreter was used.   HPI Comments: Diane [name] is a 66 y.o. female with a PMHx of Crohn's disease, who presents to the Emergency Department complaining of sudden onset, gradually worsening pressure-like centralized chest pain that  started two hours PTA. Pt notes nausea and one episode of vomiting. She reports she was lying down watching tv when she began to feel pressure-like pain in the center of her chest. She had a similar episode like last night that lasted for two hours but notes tonight the pain is much worse tonight. Pt reports the pain radiates to into to her upper abdomen.  She notes one episode of vomiting that occurred while in the ED. Pt normally walks for exercise and denies associated chest pain with exertion. At this time she is still feeling discomfort. Denies FPMHx of cardiac events.  No hx of cardiac stress test or cardiac catheterization. She is followed by a GI specialist and notes she was previously diagnosed with gallstones. Notes she has never experienced any gallbladder associated pain previously.  Denies shortness of breath, chills, or melena.   Past Medical History:  Diagnosis Date  . Allergic rhinitis   . Arthritis   . Crohn disease ([place])   . Crohn's disease ([place])   . H/O bladder problems   . History of endometriosis   . History of small bowel obstruction    2011--  [facility] --  RESOLVED -- NO SURGICAL INTERVENTION  . Osteoporosis   . Pelvic prolapse   . PONV (postoperative nausea and vomiting)    AND  HARD TO WAKE  . SUI (stress urinary incontinence, female)     Patient Active Problem List   Diagnosis Date Noted  . Age-related osteoporosis without current pathological fracture 01/06/2017  . High risk medication use 01/06/2017  . Primary osteoarthritis of both knees 01/06/2017  . Primary osteoarthritis of both hands 01/06/2017  . Spondylosis of lumbar region without myelopathy or radiculopathy 01/06/2017  . Bilateral primary osteoarthritis of hip 01/06/2017  . History of seizure 09/06/2014  . 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 Crohn's disease ([place]) 09/06/2014  . Toxic encephalopathy due to polypharmacy 09/06/2014  . Polypharmacy 09/06/2014  . Delirium due to multiple etiologies, persistent, mixed level of activity   . Psychotic disorder 09/05/2014  . Psychoses   . Malnutrition of moderate degree ([place]) 08/29/2014  . Syncope 08/28/2014  . 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 Mania ([place]) 08/28/2014  . Grand mal seizure ([place]) 08/28/2014    Past Surgical History:  Procedure Laterality Date  . ABDOMINAL HYSTERECTOMY  1980  . ANTERIOR AND POSTERIOR REPAIR  06/13/2013   Procedure:  AUGUEMENTED  RECTOCELE REPAIR WITH XENFORM;  Surgeon: Ailene Rud, MD;  Location: [place];  Service: Urology;;  . BENIGN EXCISION RIGHT BREAST CYST  1996  . BREAST EXCISIONAL BIOPSY Right 1996  . CYSTOSCOPY N/A 06/13/2013   Procedure: CYSTOSCOPY;  Surgeon: Ailene Rud, MD;  Location[facility];  Service: Urology;  Laterality:  N/A;  . EXPLORATORY LAPAROTOMY  EARLY 1970'S   INFERLITITY  . LAPAROSCOPY/ LYSIS ADHESIONS/ LEFT SALPINGOOPHORECTOMY/ RIGHT OVARIAN CYSTECTOMY  06-08-2000  . PUBOVAGINAL SLING N/A 06/13/2013   Procedure: PELVIC FLOOR REPAIR  ,  WITH  UPHOLD  , ANTERIOR VAULT WITH KELLY PLICATION WITH LYNX SLING;  Surgeon: Ailene Rud, MD;  Location: [facility];  Service: Urology;   Laterality: N/A;    OB History    No data available       Home Medications    Prior to Admission medications   Medication Sig Start Date End Date Taking? Authorizing Provider  Calcium Carb-Cholecalciferol (CALCIUM + D3 PO) Take 2 tablets by mouth daily.    [provider]  cholecalciferol (VITAMIN D) 1000 UNITS tablet Take 1,000 Units by mouth daily.    [provider]  fluticasone (FLONASE) 50 MCG/ACT nasal spray Place 1 spray into both nostrils daily as needed for allergies (allergies).  08/01/14   [provider]  folic acid (FOLVITE) 1 MG tablet Take 1 mg by mouth daily.    [provider]  haloperidol (HALDOL) 2 MG tablet Take 1 tablet (2 mg total) by mouth 2 (two) times daily. 09/09/14   Janece Canterbury, MD  mercaptopurine (PURINETHOL) 50 MG tablet Take 50 mg by mouth daily. Give on an empty stomach 1 hour before or 2 hours after meals. Caution: Chemotherapy.    [provider]  Polyethyl Glycol-Propyl Glycol (SYSTANE OP) Place 2 drops into both eyes 4 (four) times daily as needed (dry eyes).    [provider]  Probiotic Product (PROBIOTIC DAILY PO) Take 1 tablet by mouth daily. "ULTRAFLORA BRAND"    [provider]    Family History Family History  Problem Relation Age of Onset  . Dementia Mother     Social History Social History  Substance Use Topics  . Smoking status: Former Smoker    Packs/day: 0.75    Years: 40.00    Types: Cigarettes  . Smokeless tobacco: Never Used  . Alcohol use 4.2 oz/week    7 Glasses of wine per week     Allergies   Celebrex [celecoxib]; Prednisone; Actonel [risedronate sodium]; Albuterol; Ammonium-containing compounds; Boniva [ibandronic acid]; Chantix [varenicline]; Fosamax [alendronate sodium]; Imuran [azathioprine]; Macrodantin [nitrofurantoin macrocrystal]; Neosporin [neomycin-bacitracin zn-polymyx]; Nitrofuran derivatives; Other; Penicillins; Prolia [denosumab]; Tobradex  [tobramycin-dexamethasone]; and Vagifem [estradiol]   Review of Systems Review of Systems  Constitutional: Negative for chills.  Respiratory: Negative for shortness of breath.   Gastrointestinal: Positive for nausea and vomiting.    A complete 10 system review of systems was obtained and all systems are negative except as noted in the HPI and PMH.    Physical Exam Updated Vital Signs BP 105/63 (BP Location: Right Arm)   Pulse 70   Temp 98 F (36.7 C) (Oral)   Resp 12   SpO2 99%   Physical Exam  Constitutional: She is oriented to person, place, and time. She appears well-developed and well-nourished. No distress.  HENT:  Head: Normocephalic and atraumatic.  Eyes: EOM are normal.  Neck: Normal range of motion.  Cardiovascular: Normal rate, regular rhythm and normal heart sounds.   Pulmonary/Chest: Effort normal and breath sounds normal.  Abdominal: Soft. She exhibits no distension. There is no tenderness.  Musculoskeletal: Normal range of motion.  Neurological: She is alert and oriented to person, place, and time.  Skin: Skin is warm and dry.  Psychiatric: She has a normal mood and affect. Judgment normal.  Nursing note and vitals reviewed.    ED Treatments / Results  DIAGNOSTIC STUDIES: Oxygen Saturation is 99% on RA, normal by my interpretation.   COORDINATION OF CARE: 11:33 PM-Discussed next steps with pt. Pt verbalized understanding and is agreeable with the plan.   Labs (all labs ordered are listed, but only abnormal results are displayed) Labs Reviewed  BASIC METABOLIC PANEL - Abnormal; Notable for the following:       Result Value   Glucose, Bld 113 (*)    All other components within normal limits  CBC - Abnormal; Notable for the following:    WBC 3.8 (*)    RBC 3.40 (*)    HCT 35.4 (*)    MCV 104.1 (*)    MCH 36.2 (*)    All other components within normal limits  HEPATIC FUNCTION PANEL - Abnormal; Notable for the following:    AST 45 (*)    All  other components within normal limits  LIPASE, BLOOD  I-STAT TROPOININ, ED  I-STAT TROPOININ, ED    EKG  EKG Interpretation  Date/Time:  Wednesday Mar 25 2017 22:35:38 EDT Ventricular Rate:  68 PR Interval:    QRS Duration: 96 QT Interval:  412 QTC Calculation: 439 R Axis:   67 Text Interpretation:  Sinus rhythm Consider left atrial enlargement No significant change was found Confirmed by Marquis Diles  MD, Lennette Bihari ([id number]) on 03/25/2017 11:32:23 PM       Radiology Dg Chest 2 View  Result Date: 03/25/2017 CLINICAL DATA:  Chest pain and pressure.  Nausea. EXAM: CHEST  2 VIEW COMPARISON:  09/03/2014 FINDINGS: Mild hyperinflation likely indicates emphysema. Normal heart size and pulmonary vascularity. No focal airspace disease or consolidation in the lungs. No blunting of costophrenic angles. No pneumothorax. Mediastinal contours appear intact. IMPRESSION: Mild emphysematous changes in the lungs. No evidence of active pulmonary disease. Electronically Signed   By: Lucienne Capers M.D.   On: 03/25/2017 23:33   US Abdomen Limited Ruq  Result Date: 03/26/2017 CLINICAL DATA:  Upper abdominal pain.  History of gallstones. EXAM: US ABDOMEN LIMITED - RIGHT UPPER QUADRANT COMPARISON:  CT abdomen and pelvis 02/27/2017 FINDINGS: Gallbladder: Several stones demonstrated in the gallbladder, largest measuring about 1 cm diameter. Sludge also in the gallbladder. No gallbladder wall thickening or edema. Murphy's sign is negative. Common bile duct: Diameter: 4.4 mm, normal Liver: No focal lesion identified. Within normal limits in parenchymal echogenicity. IMPRESSION: Cholelithiasis and gallbladder sludge. No additional changes to suggest acute cholecystitis. Electronically Signed   By: Lucienne Capers M.D.   On: 03/26/2017 00:47    Procedures Procedures (including critical care time)  Medications Ordered in ED Medications  gi cocktail (Maalox,Lidocaine,Donnatal) (30 mLs Oral Given 03/26/17 0102)      Initial Impression / Assessment and Plan / ED Course  I have reviewed the triage vital signs and the nursing notes.  Pertinent labs & imaging results that were available during my care of the patient were reviewed by me and considered in my medical decision making (see chart for details).     Patient overall well-appearing.  She has had 2 episodes of intermittent symptoms like this.  She does have gallstones and sludge.  I suspect this is more presentation of biliary colic.  Outpatient general surgery follow-up.  Pain-free now.  Feels much better.  Discharge home in good condition.  Doubt PE.  Doubt ACS.  Troponins negative 2  Final Clinical Impressions(s) / ED Diagnoses   Final diagnoses:  Upper abdominal  pain  Precordial pain  Gallstones    New Prescriptions New Prescriptions   No medications on file   I personally performed the services described in this documentation, which was scribed in my presence. The recorded information has been reviewed and is accurate.        [name], MD 03/26/17 [place]    [name], MD 04/09/17 [phone]

## 2017-03-26 ENCOUNTER — Emergency Department (HOSPITAL_COMMUNITY): Payer: Medicare Other

## 2017-03-26 LAB — I-STAT TROPONIN, ED: Troponin i, poc: 0 ng/mL (ref 0.00–0.08)

## 2017-03-26 NOTE — ED Notes (Signed)
US at bedside

## 2017-03-28 ENCOUNTER — Emergency Department (HOSPITAL_COMMUNITY): Payer: Medicare Other

## 2017-03-28 ENCOUNTER — Encounter (HOSPITAL_COMMUNITY): Payer: Self-pay | Admitting: Obstetrics and Gynecology

## 2017-03-28 ENCOUNTER — Inpatient Hospital Stay (HOSPITAL_COMMUNITY)
Admission: EM | Admit: 2017-03-28 | Discharge: 2017-03-31 | DRG: 418 | Disposition: A | Discharge status: Home / Self Care | Payer: Medicare Other | Attending: Internal Medicine | Admitting: Internal Medicine

## 2017-03-28 ENCOUNTER — Other Ambulatory Visit: Payer: Self-pay

## 2017-03-28 DIAGNOSIS — Z880 Allergy status to penicillin: Secondary | ICD-10-CM

## 2017-03-28 DIAGNOSIS — R109 Unspecified abdominal pain: Secondary | ICD-10-CM

## 2017-03-28 DIAGNOSIS — Z888 Allergy status to other drugs, medicaments and biological substances status: Secondary | ICD-10-CM

## 2017-03-28 DIAGNOSIS — K8065 Calculus of gallbladder and bile duct with chronic cholecystitis with obstruction: Secondary | ICD-10-CM | POA: Diagnosis not present

## 2017-03-28 DIAGNOSIS — K7460 Unspecified cirrhosis of liver: Secondary | ICD-10-CM | POA: Diagnosis present

## 2017-03-28 DIAGNOSIS — K5090 Crohn's disease, unspecified, without complications: Secondary | ICD-10-CM | POA: Diagnosis present

## 2017-03-28 DIAGNOSIS — M810 Age-related osteoporosis without current pathological fracture: Secondary | ICD-10-CM | POA: Diagnosis present

## 2017-03-28 DIAGNOSIS — R740 Nonspecific elevation of levels of transaminase and lactic acid dehydrogenase [LDH]: Secondary | ICD-10-CM | POA: Diagnosis present

## 2017-03-28 DIAGNOSIS — K8050 Calculus of bile duct without cholangitis or cholecystitis without obstruction: Secondary | ICD-10-CM

## 2017-03-28 DIAGNOSIS — E876 Hypokalemia: Secondary | ICD-10-CM | POA: Diagnosis present

## 2017-03-28 DIAGNOSIS — Z79899 Other long term (current) drug therapy: Secondary | ICD-10-CM

## 2017-03-28 DIAGNOSIS — R1013 Epigastric pain: Secondary | ICD-10-CM | POA: Diagnosis not present

## 2017-03-28 DIAGNOSIS — K8043 Calculus of bile duct with acute cholecystitis with obstruction: Secondary | ICD-10-CM

## 2017-03-28 DIAGNOSIS — D539 Nutritional anemia, unspecified: Secondary | ICD-10-CM | POA: Diagnosis present

## 2017-03-28 DIAGNOSIS — Z90710 Acquired absence of both cervix and uterus: Secondary | ICD-10-CM

## 2017-03-28 DIAGNOSIS — K8051 Calculus of bile duct without cholangitis or cholecystitis with obstruction: Secondary | ICD-10-CM | POA: Diagnosis present

## 2017-03-28 DIAGNOSIS — N393 Stress incontinence (female) (male): Secondary | ICD-10-CM | POA: Diagnosis present

## 2017-03-28 DIAGNOSIS — Z881 Allergy status to other antibiotic agents status: Secondary | ICD-10-CM

## 2017-03-28 DIAGNOSIS — R945 Abnormal results of liver function studies: Secondary | ICD-10-CM | POA: Diagnosis present

## 2017-03-28 LAB — CBC
HEMATOCRIT: 36.8 % (ref 36.0–46.0)
HEMOGLOBIN: 12.6 g/dL (ref 12.0–15.0)
MCH: 35.8 pg — ABNORMAL HIGH (ref 26.0–34.0)
MCHC: 34.2 g/dL (ref 30.0–36.0)
MCV: 104.5 fL — AB (ref 78.0–100.0)
Platelets: 223 10*3/uL (ref 150–400)
RBC: 3.52 MIL/uL — ABNORMAL LOW (ref 3.87–5.11)
RDW: 14.9 % (ref 11.5–15.5)
WBC: 4.9 10*3/uL (ref 4.0–10.5)

## 2017-03-28 LAB — POCT I-STAT TROPONIN I: Troponin i, poc: 0.01 ng/mL (ref 0.00–0.08)

## 2017-03-28 LAB — BASIC METABOLIC PANEL
ANION GAP: 9 (ref 5–15)
BUN: 12 mg/dL (ref 6–20)
CHLORIDE: 105 mmol/L (ref 101–111)
CO2: 23 mmol/L (ref 22–32)
Calcium: 9.4 mg/dL (ref 8.9–10.3)
Creatinine, Ser: 0.76 mg/dL (ref 0.44–1.00)
Glucose, Bld: 100 mg/dL — ABNORMAL HIGH (ref 65–99)
POTASSIUM: 3.9 mmol/L (ref 3.5–5.1)
SODIUM: 137 mmol/L (ref 135–145)

## 2017-03-28 LAB — HEPATIC FUNCTION PANEL
ALBUMIN: 3.9 g/dL (ref 3.5–5.0)
ALK PHOS: 140 U/L — AB (ref 38–126)
ALT: 100 U/L — AB (ref 14–54)
AST: 152 U/L — ABNORMAL HIGH (ref 15–41)
BILIRUBIN INDIRECT: 0.9 mg/dL (ref 0.3–0.9)
Bilirubin, Direct: 0.8 mg/dL — ABNORMAL HIGH (ref 0.1–0.5)
TOTAL PROTEIN: 7.6 g/dL (ref 6.5–8.1)
Total Bilirubin: 1.7 mg/dL — ABNORMAL HIGH (ref 0.3–1.2)

## 2017-03-28 MED ORDER — FENTANYL CITRATE (PF) 100 MCG/2ML IJ SOLN
50.0000 ug | Freq: Once | INTRAMUSCULAR | Status: AC
  Administered 2017-03-28: 50 ug via INTRAVENOUS
  Filled 2017-03-28: qty 2

## 2017-03-28 MED ORDER — GI COCKTAIL ~~LOC~~
30.0000 mL | Freq: Once | ORAL | Status: AC
  Administered 2017-03-28: 30 mL via ORAL
  Filled 2017-03-28: qty 30

## 2017-03-28 MED ORDER — METOCLOPRAMIDE HCL 5 MG/ML IJ SOLN
5.0000 mg | Freq: Once | INTRAMUSCULAR | Status: DC
  Filled 2017-03-28: qty 2

## 2017-03-28 MED ORDER — SODIUM CHLORIDE 0.9 % IV BOLUS (SEPSIS)
1000.0000 mL | Freq: Once | INTRAVENOUS | Status: AC
Start: 2017-03-28 — End: 2017-03-28
  Administered 2017-03-28: 1000 mL via INTRAVENOUS

## 2017-03-28 NOTE — ED Provider Notes (Signed)
[facility] DEPT Provider Note   CSN: [id number] Arrival date & time: 03/28/17  1935     History   Chief Complaint Chief Complaint  Patient presents with  . Chest Pain    HPI Diane [name] is a 66 y.o. female.  The history is provided by the patient.  Chest Pain   This is a recurrent problem. The current episode started 3 to 5 hours ago. Episode frequency: intermittent. The problem has not changed since onset.The pain is associated with eating (2 hrs postpradial). The pain is present in the substernal region. The pain is moderate. The quality of the pain is described as pressure-like. The pain radiates to the epigastrium. Associated symptoms include nausea. Pertinent negatives include no back pain, no cough, no lower extremity edema, no malaise/fatigue, no shortness of breath and no vomiting. Risk factors include being elderly.  Pertinent negatives for past medical history include no CAD, no CHF, no diabetes, no DVT, no hyperlipidemia, no hypertension, no MI and no PE.    Past Medical History:  Diagnosis Date  . Allergic rhinitis   . Arthritis   . Crohn disease ([place])   . Crohn's disease ([place])   . H/O bladder problems   . History of endometriosis   . History of small bowel obstruction    2011--  [facility] --  RESOLVED -- NO SURGICAL INTERVENTION  . Osteoporosis   . Pelvic prolapse   . PONV (postoperative nausea and vomiting)    AND HARD TO WAKE  . SUI (stress urinary incontinence, female)     Patient Active Problem List   Diagnosis Date Noted  . Age-related osteoporosis without current pathological fracture 01/06/2017  . High risk medication use 01/06/2017  . Primary osteoarthritis of both knees 01/06/2017  . Primary osteoarthritis of both hands 01/06/2017  . Spondylosis of lumbar region without myelopathy or radiculopathy 01/06/2017  . Bilateral primary osteoarthritis of hip 01/06/2017  . History of seizure 09/06/2014  . 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  Crohn's disease ([place]) 09/06/2014  . Toxic encephalopathy due to polypharmacy 09/06/2014  . Polypharmacy 09/06/2014  . Delirium due to multiple etiologies, persistent, mixed level of activity   . Psychotic disorder 09/05/2014  . Psychoses   . Malnutrition of moderate degree ([place]) 08/29/2014  . Syncope 08/28/2014  . 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 Mania ([place]) 08/28/2014  . Grand mal seizure ([place]) 08/28/2014    Past Surgical History:  Procedure Laterality Date  . ABDOMINAL HYSTERECTOMY  1980  . ANTERIOR AND POSTERIOR REPAIR  06/13/2013   Procedure:  AUGUEMENTED  RECTOCELE REPAIR WITH XENFORM;  Surgeon: Ailene Rud, MD;  Location: [place];  Service: Urology;;  . BENIGN EXCISION RIGHT BREAST CYST  1996  . BREAST EXCISIONAL BIOPSY Right 1996  . CYSTOSCOPY N/A 06/13/2013   Procedure: CYSTOSCOPY;  Surgeon: Ailene Rud, MD;  Location: [facility];  Service: Urology;  Laterality: N/A;  . EXPLORATORY LAPAROTOMY  EARLY 1970'S   INFERLITITY  . LAPAROSCOPY/ LYSIS ADHESIONS/ LEFT SALPINGOOPHORECTOMY/ RIGHT OVARIAN CYSTECTOMY  06-08-2000  . PUBOVAGINAL SLING N/A 06/13/2013   Procedure: PELVIC FLOOR REPAIR  ,  WITH  UPHOLD  , ANTERIOR VAULT WITH KELLY PLICATION WITH LYNX SLING;  Surgeon: Ailene Rud, MD;  Location: [facility];  Service: Urology;  Laterality: N/A;    OB History    Gravida Para Term Preterm AB Living  1   SAB TAB Ectopic Multiple Live Births                   Home Medications    Prior to Admission medications   Medication Sig Start Date End Date Taking? Authorizing Provider  acetaminophen (TYLENOL) 500 MG tablet Take 500 mg by mouth every 6 (six) hours as needed for mild pain.   Yes [provider]  Calcium Carb-Cholecalciferol (CALCIUM + D3 PO) Take 2 tablets by mouth daily.   Yes [provider]  folic acid (FOLVITE) 1 MG tablet Take 1 mg  by mouth daily.   Yes [provider]  mercaptopurine (PURINETHOL) 50 MG tablet Take 75 mg by mouth daily. Give on an empty stomach 1 hour before or 2 hours after meals. Caution: Chemotherapy.   Yes [provider]  Polyethyl Glycol-Propyl Glycol (SYSTANE OP) Place 2 drops into both eyes 2 (two) times daily.    Yes [provider]  Probiotic Product (PROBIOTIC DAILY PO) Take 1 tablet by mouth daily. Align   Yes [provider]  haloperidol (HALDOL) 2 MG tablet Take 1 tablet (2 mg total) by mouth 2 (two) times daily. Patient not taking: Reported on 03/26/2017 09/09/14   Janece Canterbury, MD    Family History Family History  Problem Relation Age of Onset  . Dementia Mother   . Stroke Mother     Social History Social History  Substance Use Topics  . Smoking status: Former Smoker    Packs/day: 0.75    Years: 40.00    Types: Cigarettes  . Smokeless tobacco: Never Used  . Alcohol use 4.2 oz/week    7 Glasses of wine per week     Allergies   Celebrex [celecoxib]; Prednisone; Actonel [risedronate sodium]; Albuterol; Ammonium-containing compounds; Boniva [ibandronic acid]; Chantix [varenicline]; Fosamax [alendronate sodium]; Imuran [azathioprine]; Macrodantin [nitrofurantoin macrocrystal]; Neosporin [neomycin-bacitracin zn-polymyx]; Nitrofuran derivatives; Other; Penicillins; Prolia [denosumab]; Tobradex [tobramycin-dexamethasone]; and Vagifem [estradiol]   Review of Systems Review of Systems  Constitutional: Negative for malaise/fatigue.  Respiratory: Negative for cough and shortness of breath.   Cardiovascular: Positive for chest pain.  Gastrointestinal: Positive for nausea. Negative for vomiting.  Musculoskeletal: Negative for back pain.  All other systems are reviewed and are negative for acute change except as noted in the HPI    Physical Exam Updated Vital Signs BP 124/70 (BP Location: Right Arm)   Pulse 92   Temp 98.1 F (36.7 C) (Oral)    Resp 18   Ht 5' 2"  (1.575 m)   Wt 59 kg (130 lb)   SpO2 99%   BMI 23.78 kg/m   Physical Exam  Constitutional: She is oriented to person, place, and time. She appears well-developed and well-nourished. No distress.  HENT:  Head: Normocephalic and atraumatic.  Nose: Nose normal.  Eyes: Conjunctivae and EOM are normal. Pupils are equal, round, and reactive to light. Right eye exhibits no discharge. Left eye exhibits no discharge. No scleral icterus.  Neck: Normal range of motion. Neck supple.  Cardiovascular: Normal rate and regular rhythm.  Exam reveals no gallop and no friction rub.   No murmur heard. Pulmonary/Chest: Effort normal and breath sounds normal. No stridor. No respiratory distress. She has no rales.  Abdominal: Soft. She exhibits no distension. There is no tenderness. There is no rigidity, no rebound, no guarding, no CVA tenderness, no tenderness at McBurney's point and negative Murphy's sign.  Musculoskeletal: She exhibits no edema or tenderness.  Neurological: She is alert  and oriented to person, place, and time.  Skin: Skin is warm and dry. No rash noted. She is not diaphoretic. No erythema.  Psychiatric: She has a normal mood and affect.  Vitals reviewed.    ED Treatments / Results  Labs (all labs ordered are listed, but only abnormal results are displayed) Labs Reviewed  BASIC METABOLIC PANEL - Abnormal; Notable for the following:       Result Value   Glucose, Bld 100 (*)    All other components within normal limits  CBC - Abnormal; Notable for the following:    RBC 3.52 (*)    MCV 104.5 (*)    MCH 35.8 (*)    All other components within normal limits  HEPATIC FUNCTION PANEL - Abnormal; Notable for the following:    AST 152 (*)    ALT 100 (*)    Alkaline Phosphatase 140 (*)    Total Bilirubin 1.7 (*)    Bilirubin, Direct 0.8 (*)    All other components within normal limits  I-STAT TROPOININ, ED  POCT I-STAT TROPONIN I    EKG  EKG  Interpretation  Date/Time:  Saturday Mar 28 2017 19:56:44 EDT Ventricular Rate:  91 PR Interval:    QRS Duration: 92 QT Interval:  368 QTC Calculation: 453 R Axis:   67 Text Interpretation:  Sinus rhythm No significant change since last tracing Confirmed by Orlie Dakin [phone]) on 03/28/2017 8:12:11 PM       Radiology Dg Chest 2 View  Result Date: 03/28/2017 CLINICAL DATA:  Chest pain EXAM: CHEST  2 VIEW COMPARISON:  03/25/2017 FINDINGS: Cardiac shadow is stable. The lungs are well aerated bilaterally. No focal infiltrate or sizable effusion is seen. Mild degenerative changes of the thoracic spine are seen. IMPRESSION: No acute abnormality noted. Electronically Signed   By: Inez Catalina M.D.   On: 03/28/2017 20:12    Procedures Procedures (including critical care time)  EMERGENCY DEPARTMENT BILIARY ULTRASOUND INTERPRETATION "Study: Limited Abdominal Ultrasound of the Gallbladder and Common Bile Duct."  INDICATIONS: Abdominal pain Indication: Multiple views of the gallbladder and common bile duct were obtained in real-time with a Multi-frequency probe."  PERFORMED BY:  Myself IMAGES ARCHIVED?: Yes LIMITATIONS: Body habitus INTERPRETATION: Cholelithiasis, Gallbladder wall normal in thickness, Sonographic Murphy's sign abscent and Common bile duct enlarged   Medications Ordered in ED Medications  metoCLOPramide (REGLAN) injection 5 mg (5 mg Intravenous Not Given 03/28/17 2103)  sodium chloride 0.9 % bolus 1,000 mL (0 mLs Intravenous Stopped 03/28/17 2130)  gi cocktail (Maalox,Lidocaine,Donnatal) (30 mLs Oral Given 03/28/17 2058)  fentaNYL (SUBLIMAZE) injection 50 mcg (50 mcg Intravenous Given 03/28/17 2157)     Initial Impression / Assessment and Plan / ED Course  I have reviewed the triage vital signs and the nursing notes.  Pertinent labs & imaging results that were available during my care of the patient were reviewed by me and considered in my medical decision making (see  chart for details).     Presentation is most consistent with biliary colic. Is a concern for possible choledochal lithiasis. Labs with biliary obstruction. Bedside ultrasound revealed dilated common bile duct at 6.7 mm. When compared to a recent ultrasound that was obtained Wednesday which showed a 4.4 mm common bile duct, this is significant increase.  Formal ultrasound obtained which also revealed a dilated common bile duct with visible choledocholithiasis. Possible increased wall thickening, concerning for acute cholecystitis.  Case discussed with Dr. Remi Haggard who will see the patient in the morning.  She requested general surgery consultation.  Patient will be admitted to hospitalist for further management.  Final Clinical Impressions(s) / ED Diagnoses   Final diagnoses:  Abdominal pain  Choledocholithiasis with acute cholecystitis with obstruction      Leonette Monarch Grayce Sessions, MD 03/28/17 2349

## 2017-03-28 NOTE — ED Triage Notes (Signed)
Pt states she was here three days ago. She states she is having pain mid chest. Pt called the doctor on call (Dr. Therisa Doyne) with [facility] and the dr instructed her to "go to the ER and be admitted." Pt states the dr requests to be called with information at [phone]. Pt states she has had no N/V/D and that her pain does not radiate anywhere

## 2017-03-29 ENCOUNTER — Inpatient Hospital Stay (HOSPITAL_COMMUNITY): Payer: Medicare Other | Admitting: Certified Registered Nurse Anesthetist

## 2017-03-29 ENCOUNTER — Encounter (HOSPITAL_COMMUNITY): Payer: Self-pay | Admitting: Family Medicine

## 2017-03-29 ENCOUNTER — Encounter (HOSPITAL_COMMUNITY)
Admission: EM | Disposition: A | Discharge status: Home / Self Care | Payer: Self-pay | Source: Home / Self Care | Attending: Internal Medicine

## 2017-03-29 ENCOUNTER — Inpatient Hospital Stay (HOSPITAL_COMMUNITY): Payer: Medicare Other

## 2017-03-29 DIAGNOSIS — E876 Hypokalemia: Secondary | ICD-10-CM | POA: Diagnosis present

## 2017-03-29 DIAGNOSIS — K8065 Calculus of gallbladder and bile duct with chronic cholecystitis with obstruction: Secondary | ICD-10-CM | POA: Diagnosis present

## 2017-03-29 DIAGNOSIS — K5090 Crohn's disease, unspecified, without complications: Secondary | ICD-10-CM | POA: Diagnosis present

## 2017-03-29 DIAGNOSIS — N393 Stress incontinence (female) (male): Secondary | ICD-10-CM | POA: Diagnosis present

## 2017-03-29 DIAGNOSIS — K5010 Crohn's disease of large intestine without complications: Secondary | ICD-10-CM | POA: Diagnosis not present

## 2017-03-29 DIAGNOSIS — Z880 Allergy status to penicillin: Secondary | ICD-10-CM | POA: Diagnosis not present

## 2017-03-29 DIAGNOSIS — D539 Nutritional anemia, unspecified: Secondary | ICD-10-CM | POA: Diagnosis present

## 2017-03-29 DIAGNOSIS — Z881 Allergy status to other antibiotic agents status: Secondary | ICD-10-CM | POA: Diagnosis not present

## 2017-03-29 DIAGNOSIS — R1013 Epigastric pain: Secondary | ICD-10-CM | POA: Diagnosis present

## 2017-03-29 DIAGNOSIS — K8051 Calculus of bile duct without cholangitis or cholecystitis with obstruction: Secondary | ICD-10-CM | POA: Diagnosis not present

## 2017-03-29 DIAGNOSIS — R740 Nonspecific elevation of levels of transaminase and lactic acid dehydrogenase [LDH]: Secondary | ICD-10-CM | POA: Diagnosis present

## 2017-03-29 DIAGNOSIS — Z79899 Other long term (current) drug therapy: Secondary | ICD-10-CM | POA: Diagnosis not present

## 2017-03-29 DIAGNOSIS — Z90710 Acquired absence of both cervix and uterus: Secondary | ICD-10-CM | POA: Diagnosis not present

## 2017-03-29 DIAGNOSIS — R945 Abnormal results of liver function studies: Secondary | ICD-10-CM | POA: Diagnosis present

## 2017-03-29 DIAGNOSIS — M810 Age-related osteoporosis without current pathological fracture: Secondary | ICD-10-CM | POA: Diagnosis present

## 2017-03-29 DIAGNOSIS — K7460 Unspecified cirrhosis of liver: Secondary | ICD-10-CM | POA: Diagnosis present

## 2017-03-29 DIAGNOSIS — Z888 Allergy status to other drugs, medicaments and biological substances status: Secondary | ICD-10-CM | POA: Diagnosis not present

## 2017-03-29 HISTORY — PX: ENDOSCOPIC RETROGRADE CHOLANGIOPANCREATOGRAPHY (ERCP) WITH PROPOFOL: SHX5810

## 2017-03-29 LAB — COMPREHENSIVE METABOLIC PANEL
ALK PHOS: 111 U/L (ref 38–126)
ALT: 81 U/L — AB (ref 14–54)
ANION GAP: 7 (ref 5–15)
AST: 84 U/L — ABNORMAL HIGH (ref 15–41)
Albumin: 2.9 g/dL — ABNORMAL LOW (ref 3.5–5.0)
BILIRUBIN TOTAL: 1 mg/dL (ref 0.3–1.2)
BUN: 8 mg/dL (ref 6–20)
CALCIUM: 8.3 mg/dL — AB (ref 8.9–10.3)
CO2: 21 mmol/L — ABNORMAL LOW (ref 22–32)
Chloride: 113 mmol/L — ABNORMAL HIGH (ref 101–111)
Creatinine, Ser: 0.6 mg/dL (ref 0.44–1.00)
Glucose, Bld: 87 mg/dL (ref 65–99)
Potassium: 3.8 mmol/L (ref 3.5–5.1)
Sodium: 141 mmol/L (ref 135–145)
TOTAL PROTEIN: 5.9 g/dL — AB (ref 6.5–8.1)

## 2017-03-29 LAB — CBC WITH DIFFERENTIAL/PLATELET
BASOS ABS: 0 10*3/uL (ref 0.0–0.1)
BASOS PCT: 0 %
EOS ABS: 0 10*3/uL (ref 0.0–0.7)
Eosinophils Relative: 1 %
HCT: 30.6 % — ABNORMAL LOW (ref 36.0–46.0)
HEMOGLOBIN: 10.3 g/dL — AB (ref 12.0–15.0)
Lymphocytes Relative: 33 %
Lymphs Abs: 0.9 10*3/uL (ref 0.7–4.0)
MCH: 35.3 pg — ABNORMAL HIGH (ref 26.0–34.0)
MCHC: 33.7 g/dL (ref 30.0–36.0)
MCV: 104.8 fL — ABNORMAL HIGH (ref 78.0–100.0)
MONOS PCT: 11 %
Monocytes Absolute: 0.3 10*3/uL (ref 0.1–1.0)
NEUTROS PCT: 55 %
Neutro Abs: 1.5 10*3/uL — ABNORMAL LOW (ref 1.7–7.7)
Platelets: 183 10*3/uL (ref 150–400)
RBC: 2.92 MIL/uL — AB (ref 3.87–5.11)
RDW: 15 % (ref 11.5–15.5)
WBC: 2.8 10*3/uL — AB (ref 4.0–10.5)

## 2017-03-29 LAB — PROTIME-INR
INR: 1.54
PROTHROMBIN TIME: 18.7 s — AB (ref 11.4–15.2)

## 2017-03-29 LAB — LIPASE, BLOOD: LIPASE: 42 U/L (ref 11–51)

## 2017-03-29 SURGERY — ENDOSCOPIC RETROGRADE CHOLANGIOPANCREATOGRAPHY (ERCP) WITH PROPOFOL
Anesthesia: General

## 2017-03-29 MED ORDER — FAMOTIDINE IN NACL 20-0.9 MG/50ML-% IV SOLN
20.0000 mg | Freq: Every day | INTRAVENOUS | Status: DC
  Administered 2017-03-29 – 2017-03-30 (×3): 20 mg via INTRAVENOUS
  Filled 2017-03-29 (×3): qty 50

## 2017-03-29 MED ORDER — LIDOCAINE 2% (20 MG/ML) 5 ML SYRINGE
INTRAMUSCULAR | Status: DC | PRN
  Administered 2017-03-29: 100 mg via INTRAVENOUS

## 2017-03-29 MED ORDER — ONDANSETRON HCL 4 MG/2ML IJ SOLN
INTRAMUSCULAR | Status: AC
  Filled 2017-03-29: qty 2

## 2017-03-29 MED ORDER — SODIUM CHLORIDE 0.9 % IV SOLN
INTRAVENOUS | Status: DC
  Administered 2017-03-29: 13:00:00 via INTRAVENOUS

## 2017-03-29 MED ORDER — PROPOFOL 10 MG/ML IV BOLUS
INTRAVENOUS | Status: DC | PRN
  Administered 2017-03-29: 50 mg via INTRAVENOUS
  Administered 2017-03-29: 120 mg via INTRAVENOUS

## 2017-03-29 MED ORDER — SCOPOLAMINE 1 MG/3DAYS TD PT72
MEDICATED_PATCH | TRANSDERMAL | Status: AC
  Filled 2017-03-29: qty 1

## 2017-03-29 MED ORDER — IOPAMIDOL (ISOVUE-M 300) INJECTION 61%
INTRAMUSCULAR | Status: DC | PRN
  Administered 2017-03-29: 30 mL via INTRATHECAL

## 2017-03-29 MED ORDER — FENTANYL CITRATE (PF) 100 MCG/2ML IJ SOLN
25.0000 ug | INTRAMUSCULAR | Status: DC | PRN
Start: 2017-03-29 — End: 2017-03-29

## 2017-03-29 MED ORDER — ONDANSETRON HCL 4 MG/2ML IJ SOLN
4.0000 mg | Freq: Once | INTRAMUSCULAR | Status: DC | PRN
Start: 2017-03-29 — End: 2017-03-29

## 2017-03-29 MED ORDER — SCOPOLAMINE 1 MG/3DAYS TD PT72
1.0000 | MEDICATED_PATCH | TRANSDERMAL | Status: DC
  Administered 2017-03-29: 1.5 mg via TRANSDERMAL

## 2017-03-29 MED ORDER — SUCCINYLCHOLINE CHLORIDE 200 MG/10ML IV SOSY
PREFILLED_SYRINGE | INTRAVENOUS | Status: AC
  Filled 2017-03-29: qty 10

## 2017-03-29 MED ORDER — FENTANYL CITRATE (PF) 100 MCG/2ML IJ SOLN
INTRAMUSCULAR | Status: AC
  Filled 2017-03-29: qty 2

## 2017-03-29 MED ORDER — ONDANSETRON HCL 4 MG PO TABS: 4.0000 mg | ORAL_TABLET | Freq: Four times a day (QID) | ORAL | Status: DC | PRN

## 2017-03-29 MED ORDER — CEFTRIAXONE SODIUM 1 G IJ SOLR
1.0000 g | INTRAMUSCULAR | Status: DC
  Administered 2017-03-29: 1 g via INTRAVENOUS
  Filled 2017-03-29 (×3): qty 10

## 2017-03-29 MED ORDER — ONDANSETRON HCL 4 MG/2ML IJ SOLN
INTRAMUSCULAR | Status: DC | PRN
  Administered 2017-03-29: 4 mg via INTRAVENOUS

## 2017-03-29 MED ORDER — ACETAMINOPHEN 650 MG RE SUPP: 650.0000 mg | Freq: Four times a day (QID) | RECTAL | Status: DC | PRN

## 2017-03-29 MED ORDER — GLUCAGON HCL RDNA (DIAGNOSTIC) 1 MG IJ SOLR
INTRAMUSCULAR | Status: AC
  Filled 2017-03-29: qty 1

## 2017-03-29 MED ORDER — SODIUM CHLORIDE 0.9 % IV SOLN
INTRAVENOUS | Status: AC
  Administered 2017-03-29 (×2): via INTRAVENOUS

## 2017-03-29 MED ORDER — RISAQUAD PO CAPS
1.0000 | ORAL_CAPSULE | Freq: Every day | ORAL | Status: DC
  Administered 2017-03-29 – 2017-03-31 (×2): 1 via ORAL
  Filled 2017-03-29 (×2): qty 1

## 2017-03-29 MED ORDER — DEXAMETHASONE SODIUM PHOSPHATE 10 MG/ML IJ SOLN
INTRAMUSCULAR | Status: AC
  Filled 2017-03-29: qty 1

## 2017-03-29 MED ORDER — FENTANYL CITRATE (PF) 100 MCG/2ML IJ SOLN: 25.0000 ug | INTRAMUSCULAR | Status: DC | PRN

## 2017-03-29 MED ORDER — LIP MEDEX EX OINT
TOPICAL_OINTMENT | CUTANEOUS | Status: AC
  Administered 2017-03-29: 11:00:00
  Filled 2017-03-29: qty 7

## 2017-03-29 MED ORDER — FOLIC ACID 1 MG PO TABS
1.0000 mg | ORAL_TABLET | Freq: Every day | ORAL | Status: DC
  Administered 2017-03-29 – 2017-03-31 (×2): 1 mg via ORAL
  Filled 2017-03-29 (×2): qty 1

## 2017-03-29 MED ORDER — FENTANYL CITRATE (PF) 100 MCG/2ML IJ SOLN
INTRAMUSCULAR | Status: DC | PRN
  Administered 2017-03-29: 100 ug via INTRAVENOUS

## 2017-03-29 MED ORDER — POLYVINYL ALCOHOL 1.4 % OP SOLN
1.0000 [drp] | Freq: Two times a day (BID) | OPHTHALMIC | Status: DC
  Administered 2017-03-30: 1 [drp] via OPHTHALMIC
  Filled 2017-03-29: qty 15

## 2017-03-29 MED ORDER — ACETAMINOPHEN 325 MG PO TABS
650.0000 mg | ORAL_TABLET | Freq: Four times a day (QID) | ORAL | Status: DC | PRN
Start: 2017-03-29 — End: 2017-03-31
  Administered 2017-03-30: 650 mg via ORAL
  Filled 2017-03-29: qty 2

## 2017-03-29 MED ORDER — SUGAMMADEX SODIUM 200 MG/2ML IV SOLN
INTRAVENOUS | Status: DC | PRN
  Administered 2017-03-29: 200 mg via INTRAVENOUS

## 2017-03-29 MED ORDER — PROPOFOL 10 MG/ML IV BOLUS
INTRAVENOUS | Status: AC
  Filled 2017-03-29: qty 20

## 2017-03-29 MED ORDER — LIDOCAINE 2% (20 MG/ML) 5 ML SYRINGE
INTRAMUSCULAR | Status: AC
  Filled 2017-03-29: qty 5

## 2017-03-29 MED ORDER — ONDANSETRON HCL 4 MG/2ML IJ SOLN: 4.0000 mg | Freq: Four times a day (QID) | INTRAMUSCULAR | Status: DC | PRN

## 2017-03-29 MED ORDER — MERCAPTOPURINE 50 MG PO TABS: 75.0000 mg | ORAL_TABLET | Freq: Every day | ORAL | Status: DC

## 2017-03-29 MED ORDER — ROCURONIUM BROMIDE 10 MG/ML (PF) SYRINGE
PREFILLED_SYRINGE | INTRAVENOUS | Status: DC | PRN
  Administered 2017-03-29: 40 mg via INTRAVENOUS

## 2017-03-29 NOTE — H&P (Signed)
History and Physical    Diane [name] OQH:[id number] DOB: 07-Jun-1951 DOA: 03/28/2017  PCP: Antony Blackbird, MD   Patient coming from: Home  Chief Complaint: Postprandial epigastric pain, nausea   HPI: Diane [name] is a 66 y.o. female with medical history significant for Crohn's disease and history of SBO that resolved with conservative management, now presenting to the emergency department for evaluation of recurrent, severe postprandial epigastric pain with nausea and one episode of vomiting. Patient reports that she didn't usual state of health until approximately one week ago when she had an episode of postprandial epigastric pain that was described as severe, constant, associated with nausea, and spontaneously resolving after about an hour. Patient had recurrent similar episodes, also following a meal, several times this week. She had an episode the evening of 03/25/2017 that was not resolving as the others had and so she came into the ED for evaluation at that time. She had only a slight elevation in her AST at that time and her right upper quadrant ultrasound was notable for cholelithiasis without cholecystitis and without CBD dilation. Patient improved with analgesia in the ED and was referred for outpatient surgical consultation. She reports calling the surgeon's office and was offered an appointment June 6. She had a recurrence in her pain tonight which was severe and again associated with nausea and one episode of vomiting, so she came back into the ED. There has not been any fevers, chills, or jaundice. No confusion or lightheadedness.  ED Course: Upon arrival to the ED, patient is found to be afebrile, saturating well on room air, and with vital signs stable. EKG features a sinus rhythm with nonspecific ST abnormality in the inferior leads which was also seen on some prior EKGs. Chest x-ray is negative for acute cardiopulmonary disease. Chemistry panels notable for a new elevation in alkaline  phosphatase 140, AST to 152, ALT to 100, and total bilirubin of 1.7. Right upper quadrant ultrasound was performed in the emergency department and demonstrates persistent cholelithiasis with gallbladder sludge, developing gallbladder wall thickening, new CBD dilation, and a 5 mm stone is noted in the distal CBD. Patient was treated with fentanyl, GI cocktail, Reglan, and 1 L of normal saline in the emergency department. Gastroenterology was consulted by the ED physician and advised for medical admission, indicating that they would evaluate the patient in the morning. Patient has remained hemodynamically stable in the ED and in no apparent respiratory distress, and she will be admitted to the medical surgical unit for ongoing evaluation and management of choledocholithiasis with obstruction.  Review of Systems:  All other systems reviewed and apart from HPI, are negative.  Past Medical History:  Diagnosis Date  . Allergic rhinitis   . Arthritis   . Crohn disease ([place])   . Crohn's disease ([place])   . H/O bladder problems   . History of endometriosis   . History of small bowel obstruction    2011--  [facility] --  RESOLVED -- NO SURGICAL INTERVENTION  . Osteoporosis   . Pelvic prolapse   . PONV (postoperative nausea and vomiting)    AND HARD TO WAKE  . SUI (stress urinary incontinence, female)     Past Surgical History:  Procedure Laterality Date  . ABDOMINAL HYSTERECTOMY  1980  . ANTERIOR AND POSTERIOR REPAIR  06/13/2013   Procedure:  AUGUEMENTED  RECTOCELE REPAIR WITH XENFORM;  Surgeon: Ailene Rud, MD;  Location: [facility];  Service: Urology;;  . BENIGN EXCISION RIGHT  BREAST CYST  1996  . BREAST EXCISIONAL BIOPSY Right 1996  . CYSTOSCOPY N/A 06/13/2013   Procedure: CYSTOSCOPY;  Surgeon: Ailene Rud, MD;  Location: [facility];  Service: Urology;  Laterality: N/A;  . EXPLORATORY LAPAROTOMY  EARLY 1970'S   INFERLITITY  . LAPAROSCOPY/ LYSIS  ADHESIONS/ LEFT SALPINGOOPHORECTOMY/ RIGHT OVARIAN CYSTECTOMY  06-08-2000  . PUBOVAGINAL SLING N/A 06/13/2013   Procedure: PELVIC FLOOR REPAIR  ,  WITH  UPHOLD  , ANTERIOR VAULT WITH KELLY PLICATION WITH LYNX SLING;  Surgeon: Ailene Rud, MD;  Location: [facility];  Service: Urology;  Laterality: N/A;     reports that she has quit smoking. Her smoking use included Cigarettes. She has a 30.00 pack-year smoking history. She has never used smokeless tobacco. She reports that she drinks about 4.2 oz of alcohol per week . She reports that she does not use drugs.  Allergies  Allergen Reactions  . Celebrex [Celecoxib] Anaphylaxis  . Prednisone Other (See Comments)    Severe psychosis requiring hospitalization.  Avoid use if possible.    Edd Fabian [Risedronate Sodium] Other (See Comments)    GI UPSET  . Albuterol Other (See Comments)    sobbing  . Ammonium-Containing Compounds   . Boniva [Ibandronic Acid] Other (See Comments)    GI UPSET  . Chantix [Varenicline] Other (See Comments)    MOOD CHANGE  . Fosamax [Alendronate Sodium] Other (See Comments)    GI UPSET  . Imuran [Azathioprine] Diarrhea and Nausea And Vomiting  . Macrodantin [Nitrofurantoin Macrocrystal]   . Neosporin [Neomycin-Bacitracin Zn-Polymyx] Swelling and Other (See Comments)    REDNESS  . Nitrofuran Derivatives Other (See Comments)    CHILLS AND FEVER  . Other     Pt states allergic to 3 more medicines and doesn't know name of them.  . Penicillins Diarrhea, Nausea And Vomiting and Other (See Comments)  . Prolia [Denosumab] Other (See Comments)    Irritates chrons  . Tobradex [Tobramycin-Dexamethasone] Other (See Comments)    SEVERE EYE IRRIATION  . Vagifem [Estradiol] Other (See Comments)    bleeding    Family History  Problem Relation Age of Onset  . Dementia Mother   . Stroke Mother      Prior to Admission medications   Medication Sig Start Date End Date Taking? Authorizing Provider    acetaminophen (TYLENOL) 500 MG tablet Take 500 mg by mouth every 6 (six) hours as needed for mild pain.   Yes [provider]  Calcium Carb-Cholecalciferol (CALCIUM + D3 PO) Take 2 tablets by mouth daily.   Yes [provider]  folic acid (FOLVITE) 1 MG tablet Take 1 mg by mouth daily.   Yes [provider]  mercaptopurine (PURINETHOL) 50 MG tablet Take 75 mg by mouth daily. Give on an empty stomach 1 hour before or 2 hours after meals. Caution: Chemotherapy.   Yes [provider]  Polyethyl Glycol-Propyl Glycol (SYSTANE OP) Place 2 drops into both eyes 2 (two) times daily.    Yes [provider]  Probiotic Product (PROBIOTIC DAILY PO) Take 1 tablet by mouth daily. Align   Yes [provider]    Physical Exam: Vitals:   03/28/17 2300 03/28/17 2330 03/29/17 0030 03/29/17 0053  BP: (!) 95/52 117/64 117/67 117/62  Pulse: 78 79 80 75  Resp:    16  Temp:    98.7 F (37.1 C)  TempSrc:    Oral  SpO2: 95% 93% 93% 99%  Weight:  Height:          Constitutional: NAD, calm, in obvious discomfort.  Eyes: PERTLA, lids and conjunctivae normal ENMT: Mucous membranes are moist. Posterior pharynx clear of any exudate or lesions.   Neck: normal, supple, no masses, no thyromegaly Respiratory: clear to auscultation bilaterally, no wheezing, no crackles. Normal respiratory effort.  Cardiovascular: S1 & S2 heard, regular rate and rhythm. No extremity edema. No significant JVD. Abdomen: No distension, soft, no masses palpated. Tender in epigastrium and RUQ without rebound pain or guarding. Bowel sounds active.  Musculoskeletal: no clubbing / cyanosis. No joint deformity upper and lower extremities. Normal muscle tone.  Skin: no significant rashes, lesions, ulcers. Warm, dry, well-perfused. No jaundice. Neurologic: CN 2-12 grossly intact. Sensation intact, DTR normal. Strength 5/5 in all 4 limbs.  Psychiatric: Alert and oriented x 3. Pleasant and  cooperative.    Labs on Admission: I have personally reviewed following labs and imaging studies  CBC:  Recent Labs Lab 03/25/17 2311 03/28/17 2017  WBC 3.8* 4.9  HGB 12.3 12.6  HCT 35.4* 36.8  MCV 104.1* 104.5*  PLT 244 542   Basic Metabolic Panel:  Recent Labs Lab 03/25/17 2311 03/28/17 2017  NA 137 137  K 3.5 3.9  CL 103 105  CO2 27 23  GLUCOSE 113* 100*  BUN 14 12  CREATININE 0.90 0.76  CALCIUM 9.1 9.4   GFR: Estimated Creatinine Clearance: 54.7 mL/min (by C-G formula based on SCr of 0.76 mg/dL). Liver Function Tests:  Recent Labs Lab 03/25/17 2311 03/28/17 2100  AST 45* 152*  ALT 51 100*  ALKPHOS 79 140*  BILITOT 0.8 1.7*  PROT 7.4 7.6  ALBUMIN 3.7 3.9    Recent Labs Lab 03/25/17 2311  LIPASE 35   No results for input(s): AMMONIA in the last 168 hours. Coagulation Profile: No results for input(s): INR, PROTIME in the last 168 hours. Cardiac Enzymes: No results for input(s): CKTOTAL, CKMB, CKMBINDEX, TROPONINI in the last 168 hours. BNP (last 3 results) No results for input(s): PROBNP in the last 8760 hours. HbA1C: No results for input(s): HGBA1C in the last 72 hours. CBG: No results for input(s): GLUCAP in the last 168 hours. Lipid Profile: No results for input(s): CHOL, HDL, LDLCALC, TRIG, CHOLHDL, LDLDIRECT in the last 72 hours. Thyroid Function Tests: No results for input(s): TSH, T4TOTAL, FREET4, T3FREE, THYROIDAB in the last 72 hours. Anemia Panel: No results for input(s): VITAMINB12, FOLATE, FERRITIN, TIBC, IRON, RETICCTPCT in the last 72 hours. Urine analysis:    Component Value Date/Time   COLORURINE YELLOW 09/06/2014 1805   APPEARANCEUR CLEAR 09/06/2014 1805   LABSPEC 1.008 09/06/2014 1805   PHURINE 6.0 09/06/2014 1805   GLUCOSEU NEGATIVE 09/06/2014 1805   HGBUR NEGATIVE 09/06/2014 1805   BILIRUBINUR NEGATIVE 09/06/2014 1805   KETONESUR 40 (A) 09/06/2014 1805   PROTEINUR NEGATIVE 09/06/2014 1805   UROBILINOGEN 0.2  09/06/2014 1805   NITRITE NEGATIVE 09/06/2014 1805   LEUKOCYTESUR NEGATIVE 09/06/2014 1805   Sepsis Labs: @LABRCNTIP (procalcitonin:4,lacticidven:4) )No results found for this or any previous visit (from the past 240 hour(s)).   Radiological Exams on Admission: Dg Chest 2 View  Result Date: 03/28/2017 CLINICAL DATA:  Chest pain EXAM: CHEST  2 VIEW COMPARISON:  03/25/2017 FINDINGS: Cardiac shadow is stable. The lungs are well aerated bilaterally. No focal infiltrate or sizable effusion is seen. Mild degenerative changes of the thoracic spine are seen. IMPRESSION: No acute abnormality noted. Electronically Signed   By: Inez Catalina M.D.   On:  03/28/2017 20:12   US Abdomen Limited Ruq  Result Date: 03/29/2017 CLINICAL DATA:  Chest pain.  Known gallstones. EXAM: US ABDOMEN LIMITED - RIGHT UPPER QUADRANT COMPARISON:  03/26/2017 FINDINGS: Gallbladder: As seen on the previous study, there is cholelithiasis with sludge in the gallbladder. Gallbladder wall today is demonstrating mild focal thickening at 4.2 mm. This represents progression since the previous study. Murphy's sign remains negative. Common bile duct: Diameter: 7.2 mm, mildly dilated. An echogenic structure consistent with a 5 mm stone is demonstrated in the distal common bile duct. This is new since previous study. Liver: No focal lesion identified. Within normal limits in parenchymal echogenicity. IMPRESSION: Persistent cholelithiasis with gallbladder sludge. Gallbladder wall thickness is developing since previous study. New development of mild bile duct dilatation with a 5 mm stone now seen in the distal common bile duct. Electronically Signed   By: Lucienne Capers M.D.   On: 03/29/2017 00:23    EKG: Independently reviewed. Sinus rhythm, non-specific ST abnormality inferiorly (also seen on some priors).   Assessment/Plan  1. Choledocholithiasis with obstruction - Pt presents with recurring postprandial epigastric pain and nausea,  becoming longer-lasting and more severe  - Labs reveal new mild elevations in alkaline phosphatase, transaminases, and bilirubin  - RUQ [place] with CBD dilation and 5 mm stone in distal CBD; these findings are new from US performed 03/26/17  - She is afebrile and without leukocytosis to suggest cholangitis; she has remained hemodynamically stable; pain is well-controlled with low-dose fentanyl pushes  - GI is consulting and much appreciated - Plan to keep NPO, continue pain-control and IVF hydration; culture if febrile   2. Crohn's disease  - Pt reports this to be stable  - She had been on weekly Humira until suffering zoster a few months ago, at which point it was stopped  - She has continued with mercaptopurine and reports that her gastroenterologist was very pleased with her last CT in April 2018 - Continue mercaptopurine with folate   DVT prophylaxis: SCD's Code Status: Full  Family Communication: Husband updated at bedside  Disposition Plan: Admit to med-surg Consults called: Gastroenterology  Admission status: Inpatient    Vianne Bulls, MD [facility] Pager [phone]  If 7PM-7AM, please contact night-coverage [email or web address] Password [email or web address]  03/29/2017, 1:30 AM

## 2017-03-29 NOTE — Anesthesia Preprocedure Evaluation (Signed)
Anesthesia Evaluation  Patient identified by MRN, date of birth, ID band Patient awake    Reviewed: Allergy & Precautions, H&P , NPO status , Patient's Chart, lab work & pertinent test results  History of Anesthesia Complications (+) PONV, PROLONGED EMERGENCE and history of anesthetic complications  Airway Mallampati: II  TM Distance: >3 FB Neck ROM: Full    Dental no notable dental hx.    Pulmonary former smoker,    Pulmonary exam normal breath sounds clear to auscultation       Cardiovascular Exercise Tolerance: Good negative cardio ROS Normal cardiovascular exam Rhythm:Regular Rate:Normal     Neuro/Psych Seizures -, Well Controlled,  Steroid induced psych episode in 2015   GI/Hepatic Neg liver ROS, Chron's   Endo/Other  negative endocrine ROS  Renal/GU negative Renal ROS  negative genitourinary   Musculoskeletal  (+) Arthritis , Osteoarthritis,    Abdominal   Peds negative pediatric ROS (+)  Hematology negative hematology ROS (+)   Anesthesia Other Findings   Reproductive/Obstetrics negative OB ROS                             Anesthesia Physical  Anesthesia Plan  ASA: II  Anesthesia Plan: General   Post-op Pain Management:    Induction: Intravenous  Airway Management Planned: Oral ETT  Additional Equipment:   Intra-op Plan:   Post-operative Plan: Extubation in OR  Informed Consent: I have reviewed the patients History and Physical, chart, labs and discussed the procedure including the risks, benefits and alternatives for the proposed anesthesia with the patient or authorized representative who has indicated his/her understanding and acceptance.   Dental advisory given  Plan Discussed with: CRNA and Surgeon  Anesthesia Plan Comments:         Anesthesia Quick Evaluation

## 2017-03-29 NOTE — Anesthesia Postprocedure Evaluation (Signed)
Anesthesia Post Note  Patient: Diane [name]  Procedure(s) Performed: Procedure(s) (LRB): ENDOSCOPIC RETROGRADE CHOLANGIOPANCREATOGRAPHY (ERCP) WITH PROPOFOL (N/A)  Patient location during evaluation: PACU Anesthesia Type: General Level of consciousness: awake and alert Pain management: pain level controlled Vital Signs Assessment: post-procedure vital signs reviewed and stable Respiratory status: spontaneous breathing, nonlabored ventilation, respiratory function stable and patient connected to nasal cannula oxygen Cardiovascular status: blood pressure returned to baseline and stable Postop Assessment: no signs of nausea or vomiting Anesthetic complications: no       Last Vitals:  Vitals:   03/29/17 1620 03/29/17 1640  BP: (!) 113/56 120/75  Pulse: 90 90  Resp: 15 16  Temp:  36.6 C    Last Pain:  Vitals:   03/29/17 1640  TempSrc: Oral  PainSc:                  [name] P Marilyn Wing

## 2017-03-29 NOTE — H&P (Signed)
The risk and benefits of the procedure were explained to the patient in details. She understands the risk of bleeding, infection, perforation requiring surgery and pancreatitis. The procedure will be performed under moderate anesthesia care.

## 2017-03-29 NOTE — Op Note (Signed)
ERCP was performed with sphincterotomy and balloon sweep. Cholangiogram revealed no filling defects. Likely due to passed CBD stone. After multiple sweeps with 9 and 12 mm balloon, no sludge or stone was noted. Bile was noted freely flowing out of the ampulla post sphincterotomy. There was no bleeding post sphincterotomy. Gallbladder was noted to fill during cholangiogram.  Pancreatic duct was inadvertently cannulated once without injection of dye.   Recommend clear liquid diet in 4 hours if there is no abdominal pain.  Will need to keep patient nothing by mouth post midnight as patient is scheduled for a cholecystectomy in a.m. Diet to be advanced after cholecystectomy as per surgical evaluation.

## 2017-03-29 NOTE — Progress Notes (Signed)
Patient ID: Diane [name], female   DOB: 1951-10-11, 66 y.o.   MRN: [id number]  PROGRESS NOTE    AMBERLIE GAILLARD  HYQ:[id number] DOB: 1950-11-24 DOA: 03/28/2017 PCP: Antony Blackbird, MD   Brief Narrative:  66 y.o. female with medical history significant for Crohn's disease and history of SBO that resolved with conservative management, presented with  recurrent, severe postprandial epigastric pain with nausea and one episode of vomiting. She was found to have cholelithiasis with CBD dilation and distal CBD stone along with gallbladder wall thickening. GI was consulted.  Assessment & Plan:   Principal Problem:   Choledocholithiasis with obstruction Active Problems:   Crohn's disease ([place])  1. Choledocholithiasis with mild acute transaminitis - Awaiting GI evaluation and probable ERCP today. - Nothing by mouth.  - Currently pain-free - Repeat a.m. LFTs  2. Cholelithiasis with gallbladder thickening - Patient is currently afebrile and nontender. Start Rocephin empirically. Spoke to general surgery on-call Dr. Donne Hazel who will evaluate the patient.  2. Crohn's disease  - Pt reports this to be stable  - She had been on weekly Humira until suffering zoster a few months ago, at which point it was stopped  - She has continued with mercaptopurine and reports that her gastroenterologist was very pleased with her last CT in April 2018 - Hold mercaptopurine for now    DVT prophylaxis: SCDs Code Status:  Full Family Communication: None at bedside Disposition Plan: Home in 2-3 days  Consultants: GI and general surgery  Procedures: None  Antimicrobials: Rocephin started today   Subjective: Patient seen and examined at bedside. She denies any current fever, vomiting, abdominal pain Objective: Vitals:   03/29/17 0053 03/29/17 0508 03/29/17 1049 03/29/17 1334  BP: 117/62 (!) 111/49 (!) 103/54 (!) 111/51  Pulse: 75 68 72 81  Resp: 16 15 15 17   Temp: 98.7 F (37.1 C) 98.7 F (37.1 C)  98.4 F (36.9 C) 98.4 F (36.9 C)  TempSrc: Oral Oral Oral Oral  SpO2: 99% 97% 97% 97%  Weight:      Height:        Intake/Output Summary (Last 24 hours) at 03/29/17 1414 Last data filed at 03/29/17 1145  Gross per 24 hour  Intake             1495 ml  Output             1520 ml  Net              -25 ml   Filed Weights   03/28/17 1945  Weight: 59 kg (130 lb)    Examination:  General exam: Appears calm and comfortable  Respiratory system: Bilateral decreased breath sound at bases Cardiovascular system: S1 & S2 heard, Rate controlled  Gastrointestinal system: Abdomen is nondistended, soft and nontender. Normal bowel sounds heard. Central nervous system: Alert and oriented. No focal neurological deficits. Moving extremities Extremities: No cyanosis, clubbing, edema  Skin: No rashes, lesions or ulcers Psychiatry: Judgement and insight appear normal. Mood & affect appropriate.     Data Reviewed: I have personally reviewed following labs and imaging studies  CBC:  Recent Labs Lab 03/25/17 2311 03/28/17 2017 03/29/17 0442  WBC 3.8* 4.9 2.8*  NEUTROABS  --   --  1.5*  HGB 12.3 12.6 10.3*  HCT 35.4* 36.8 30.6*  MCV 104.1* 104.5* 104.8*  PLT 244 223 952   Basic Metabolic Panel:  Recent Labs Lab 03/25/17 2311 03/28/17 2017 03/29/17 0442  NA 137 137 141  K 3.5 3.9 3.8  CL 103 105 113*  CO2 27 23 21*  GLUCOSE 113* 100* 87  BUN 14 12 8   CREATININE 0.90 0.76 0.60  CALCIUM 9.1 9.4 8.3*   GFR: Estimated Creatinine Clearance: 54.7 mL/min (by C-G formula based on SCr of 0.6 mg/dL). Liver Function Tests:  Recent Labs Lab 03/25/17 2311 03/28/17 2100 03/29/17 0442  AST 45* 152* 84*  ALT 51 100* 81*  ALKPHOS 79 140* 111  BILITOT 0.8 1.7* 1.0  PROT 7.4 7.6 5.9*  ALBUMIN 3.7 3.9 2.9*    Recent Labs Lab 03/25/17 2311 03/28/17 2017  LIPASE 35 42   No results for input(s): AMMONIA in the last 168 hours. Coagulation Profile:  Recent Labs Lab  03/29/17 0442  INR 1.54   Cardiac Enzymes: No results for input(s): CKTOTAL, CKMB, CKMBINDEX, TROPONINI in the last 168 hours. BNP (last 3 results) No results for input(s): PROBNP in the last 8760 hours. HbA1C: No results for input(s): HGBA1C in the last 72 hours. CBG: No results for input(s): GLUCAP in the last 168 hours. Lipid Profile: No results for input(s): CHOL, HDL, LDLCALC, TRIG, CHOLHDL, LDLDIRECT in the last 72 hours. Thyroid Function Tests: No results for input(s): TSH, T4TOTAL, FREET4, T3FREE, THYROIDAB in the last 72 hours. Anemia Panel: No results for input(s): VITAMINB12, FOLATE, FERRITIN, TIBC, IRON, RETICCTPCT in the last 72 hours. Sepsis Labs: No results for input(s): PROCALCITON, LATICACIDVEN in the last 168 hours.  No results found for this or any previous visit (from the past 240 hour(s)).       Radiology Studies: Dg Chest 2 View  Result Date: 03/28/2017 CLINICAL DATA:  Chest pain EXAM: CHEST  2 VIEW COMPARISON:  03/25/2017 FINDINGS: Cardiac shadow is stable. The lungs are well aerated bilaterally. No focal infiltrate or sizable effusion is seen. Mild degenerative changes of the thoracic spine are seen. IMPRESSION: No acute abnormality noted. Electronically Signed   By: Inez Catalina M.D.   On: 03/28/2017 20:12   US Abdomen Limited Ruq  Result Date: 03/29/2017 CLINICAL DATA:  Chest pain.  Known gallstones. EXAM: US ABDOMEN LIMITED - RIGHT UPPER QUADRANT COMPARISON:  03/26/2017 FINDINGS: Gallbladder: As seen on the previous study, there is cholelithiasis with sludge in the gallbladder. Gallbladder wall today is demonstrating mild focal thickening at 4.2 mm. This represents progression since the previous study. Murphy's sign remains negative. Common bile duct: Diameter: 7.2 mm, mildly dilated. An echogenic structure consistent with a 5 mm stone is demonstrated in the distal common bile duct. This is new since previous study. Liver: No focal lesion identified.  Within normal limits in parenchymal echogenicity. IMPRESSION: Persistent cholelithiasis with gallbladder sludge. Gallbladder wall thickness is developing since previous study. New development of mild bile duct dilatation with a 5 mm stone now seen in the distal common bile duct. Electronically Signed   By: Lucienne Capers M.D.   On: 03/29/2017 00:23        Scheduled Meds: . [MAR Hold] acidophilus  1 capsule Oral Daily  . [MAR Hold] folic acid  1 mg Oral Daily  . [MAR Hold] polyvinyl alcohol  1 drop Both Eyes BID   Continuous Infusions: . sodium chloride Stopped (03/29/17 1246)  . [MAR Hold] cefTRIAXone (ROCEPHIN)  IV 1 g (03/29/17 1341)  . [MAR Hold] famotidine (PEPCID) IV Stopped (03/29/17 0388)     LOS: 0 days        Aline August, MD [facility] Pager [phone]  If 7PM-7AM, please contact night-coverage [email or web address] Password  [email or web address] 03/29/2017, 2:14 PM

## 2017-03-29 NOTE — Transfer of Care (Signed)
Immediate Anesthesia Transfer of Care Note  Patient: Diane [name]  Procedure(s) Performed: Procedure(s) with comments: ENDOSCOPIC RETROGRADE CHOLANGIOPANCREATOGRAPHY (ERCP) WITH PROPOFOL (N/A) - prone position  Patient Location: PACU  Anesthesia Type:General  Level of Consciousness:  sedated, patient cooperative and responds to stimulation  Airway & Oxygen Therapy:Patient Spontanous Breathing and Patient connected to face mask oxgen  Post-op Assessment:  Report given to PACU RN and Post -op Vital signs reviewed and stable  Post vital signs:  Reviewed and stable  Last Vitals:  Vitals:   03/29/17 1334 03/29/17 1606  BP: (!) 111/51   Pulse: 81   Resp: 17   Temp: 36.9 C 98.0 C    Complications: No apparent anesthesia complications

## 2017-03-29 NOTE — Consult Note (Signed)
[facility] Consult  Referring Provider: Dr.Cardama(ED) Primary Care Physician:  Antony Blackbird, MD Primary Gastroenterologist: Dr.Magod  Reason for Consultation:  CBD stone  HPI: Diane [name] is a 66 y.o. [name] who presented to the ER with epigastric and lower chest pain. Patient had called me yesterday evening with complaints of ongoing chest pain and epigastric pain for the past 3 hours. She had similar pain on Tuesday which was self resolved. It recurred on Wednesday lasted for about 3 hours, She was evaluated in the ER for cardiac etiology and had an ultrasound which showed gallstones and she was advised to follow up with surgery as an outpatient for an elective cholecystectomy. Because of ongoing abdominal pain I advised the patient to proceed to ER. She had an ultrasound yesterday which showed gallbladder wall thickening, development of mild CBD dilatation with 5 mm stone seen in distal common bile duct. Patient denies any fever, chills or rigors. He has not had any abdominal pain since 10 PM yesterday after admission. She has not had any recent weight loss. She is known to have history of Crohn's disease managed with 6-mercaptopurine.   Past Medical History:  Diagnosis Date  . Allergic rhinitis   . Arthritis   . Crohn disease ([place])   . Crohn's disease ([place])   . H/O bladder problems   . History of endometriosis   . History of small bowel obstruction    2011--  [facility] --  RESOLVED -- NO SURGICAL INTERVENTION  . Osteoporosis   . Pelvic prolapse   . PONV (postoperative nausea and vomiting)    AND HARD TO WAKE  . SUI (stress urinary incontinence, female)     Past Surgical History:  Procedure Laterality Date  . ABDOMINAL HYSTERECTOMY  1980  . ANTERIOR AND POSTERIOR REPAIR  06/13/2013   Procedure:  AUGUEMENTED  RECTOCELE REPAIR WITH XENFORM;  Surgeon: Ailene Rud, MD;  Location: [place];  Service: Urology;;  . BENIGN EXCISION RIGHT  BREAST CYST  1996  . BREAST EXCISIONAL BIOPSY Right 1996  . CYSTOSCOPY N/A 06/13/2013   Procedure: CYSTOSCOPY;  Surgeon: Ailene Rud, MD;  [facility]: [facility];  Service: Urology;  Laterality: N/A;  . EXPLORATORY LAPAROTOMY  EARLY 1970'S   INFERLITITY  . LAPAROSCOPY/ LYSIS ADHESIONS/ LEFT SALPINGOOPHORECTOMY/ RIGHT OVARIAN CYSTECTOMY  06-08-2000  . PUBOVAGINAL SLING N/A 06/13/2013   Procedure: PELVIC FLOOR REPAIR  ,  WITH  UPHOLD  , ANTERIOR VAULT WITH KELLY PLICATION WITH LYNX SLING;  Surgeon: Ailene Rud, MD;  Location: [facility];  Service: Urology;  Laterality: N/A;    Prior to Admission medications   Medication Sig Start Date End Date Taking? Authorizing Provider  acetaminophen (TYLENOL) 500 MG tablet Take 500 mg by mouth every 6 (six) hours as needed for mild pain.   Yes [provider]  Calcium Carb-Cholecalciferol (CALCIUM + D3 PO) Take 2 tablets by mouth daily.   Yes [provider]  folic acid (FOLVITE) 1 MG tablet Take 1 mg by mouth daily.   Yes [provider]  mercaptopurine (PURINETHOL) 50 MG tablet Take 75 mg by mouth daily. Give on an empty stomach 1 hour before or 2 hours after meals. Caution: Chemotherapy.   Yes [provider]  Polyethyl Glycol-Propyl Glycol (SYSTANE OP) Place 2 drops into both eyes 2 (two) times daily.    Yes [provider]  Probiotic Product (PROBIOTIC DAILY PO) Take 1 tablet by mouth daily. Electronics engineer  Yes [provider]    Current Facility-Administered Medications  Medication Dose Route Frequency Provider Last Rate Last Dose  . 0.9 %  sodium chloride infusion   Intravenous Continuous Ronnette Juniper, MD   Stopped at 03/29/17 1246  . [MAR Hold] acetaminophen (TYLENOL) tablet 650 mg  650 mg Oral Q6H PRN Opyd, Ilene Qua, MD       Or  . Doug Sou Hold] acetaminophen (TYLENOL) suppository 650 mg  650 mg Rectal Q6H PRN Opyd, Ilene Qua, MD      . Doug Sou Hold]  acidophilus (RISAQUAD) capsule 1 capsule  1 capsule Oral Daily Opyd, Ilene Qua, MD   1 capsule at 03/29/17 1105  . [MAR Hold] cefTRIAXone (ROCEPHIN) 1 g in dextrose 5 % 50 mL IVPB  1 g Intravenous Q24H Alekh, Kshitiz, MD 100 mL/hr at 03/29/17 1341 1 g at 03/29/17 1341  . [MAR Hold] famotidine (PEPCID) IVPB 20 mg premix  20 mg Intravenous QHS Vianne Bulls, MD   Stopped at 03/29/17 [phone]  . [MAR Hold] fentaNYL (SUBLIMAZE) injection 25-50 mcg  25-50 mcg Intravenous Q2H PRN Opyd, Ilene Qua, MD      . Doug Sou Hold] folic acid (FOLVITE) tablet 1 mg  1 mg Oral Daily Opyd, Ilene Qua, MD   1 mg at 03/29/17 1105  . [MAR Hold] ondansetron (ZOFRAN) tablet 4 mg  4 mg Oral Q6H PRN Opyd, Ilene Qua, MD       Or  . Doug Sou Hold] ondansetron (ZOFRAN) injection 4 mg  4 mg Intravenous Q6H PRN Opyd, Ilene Qua, MD      . Doug Sou Hold] polyvinyl alcohol (LIQUIFILM TEARS) 1.4 % ophthalmic solution 1 drop  1 drop Both Eyes BID Opyd, Ilene Qua, MD        Allergies as of 03/28/2017 - Review Complete 03/28/2017  Allergen Reaction Noted  . Celebrex [celecoxib] Anaphylaxis 11/01/2012  . Prednisone Other (See Comments) 09/09/2014  . Actonel [risedronate sodium] Other (See Comments) 11/01/2012  . Albuterol Other (See Comments) 10/03/2014  . Ammonium-containing compounds  10/03/2014  . Boniva [ibandronic acid] Other (See Comments) 11/01/2012  . Chantix [varenicline] Other (See Comments) 11/01/2012  . Fosamax [alendronate sodium] Other (See Comments) 11/01/2012  . Imuran [azathioprine] Diarrhea and Nausea And Vomiting 10/03/2014  . Macrodantin [nitrofurantoin macrocrystal]  10/03/2014  . Neosporin [neomycin-bacitracin zn-polymyx] Swelling and Other (See Comments) 11/01/2012  . Nitrofuran derivatives Other (See Comments) 06/06/2013  . Other  09/06/2014  . Penicillins Diarrhea, Nausea And Vomiting, and Other (See Comments) 10/03/2014  . Prolia [denosumab] Other (See Comments) 10/03/2014  . Tobradex [tobramycin-dexamethasone] Other  (See Comments) 11/01/2012  . Vagifem [estradiol] Other (See Comments) 10/03/2014    Family History  Problem Relation Age of Onset  . Dementia Mother   . Stroke Mother     Social History   Social History  . Marital status: Married    Spouse name: N/A  . Number of children: N/A  . Years of education: N/A   Occupational History  . Not on file.   Social History Main Topics  . Smoking status: Former Smoker    Packs/day: 0.75    Years: 40.00    Types: Cigarettes  . Smokeless tobacco: Never Used  . Alcohol use 4.2 oz/week    7 Glasses of wine per week  . Drug use: No  . Sexual activity: Not on file   Other Topics Concern  . Not on file   Social History Narrative  . No narrative on file  Review of Systems: Positive ELF:[id number] pain GI: Described in detail in HPI.    Gen: Denies any fever, chills, rigors, night sweats, anorexia, fatigue, weakness, malaise, involuntary weight loss, and sleep disorder CV: Denies chest pain, angina, palpitations, syncope, orthopnea, PND, peripheral edema, and claudication. Resp: Denies dyspnea, cough, sputum, wheezing, coughing up blood. GU : Denies urinary burning, blood in urine, urinary frequency, urinary hesitancy, nocturnal urination, and urinary incontinence. MS: Denies joint pain or swelling.  Denies muscle weakness, cramps, atrophy.  Derm: Denies rash, itching, oral ulcerations, hives, unhealing ulcers.  Psych: Denies depression, anxiety, memory loss, suicidal ideation, hallucinations,  and confusion. Heme: Denies bruising, bleeding, and enlarged lymph nodes. Neuro:  Denies any headaches, dizziness, paresthesias. Endo:  Denies any problems with DM, thyroid, adrenal function.  Physical Exam: Vital signs in last 24 hours: Temp:  [98.1 F (36.7 C)-98.7 F (37.1 C)] 98.4 F (36.9 C) (05/27 1334) Pulse Rate:  [68-92] 81 (05/27 1334) Resp:  [15-24] 17 (05/27 1334) BP: (95-124)/(49-80) 111/51 (05/27 1334) SpO2:  [92 %-99 %] 97 %  (05/27 1334) Weight:  [59 kg (130 lb)] 59 kg (130 lb) (05/26 1945) Last BM Date: 03/28/17  General:   Alert,  Well-developed, well-nourished, pleasant and cooperative in NAD Head:  Normocephalic and atraumatic. Eyes:  Sclera clear, no icterus.   Conjunctiva pink. Ears:  Normal auditory acuity. Nose:  No deformity, discharge,  or lesions. Mouth:  No deformity or lesions.  Oropharynx pink & moist. Neck:  Supple; no masses or thyromegaly. Lungs:  Clear throughout to auscultation.   No wheezes, crackles, or rhonchi. No acute distress. Heart:  Regular rate and rhythm; no murmurs, clicks, rubs,  or gallops. Extremities:  Without clubbing or edema. Neurologic:  Alert and  oriented x4;  grossly normal neurologically. Skin:  Intact without significant lesions or rashes. Psych:  Alert and cooperative. Normal mood and affect. Abdomen:  Soft, nontender and nondistended. No masses, hepatosplenomegaly or hernias noted. Normal bowel sounds, without guarding, and without rebound.         Lab Results:  Recent Labs  03/28/17 2017 03/29/17 0442  WBC 4.9 2.8*  HGB 12.6 10.3*  HCT 36.8 30.6*  PLT 223 183   BMET  Recent Labs  03/28/17 2017 03/29/17 0442  NA 137 141  K 3.9 3.8  CL 105 113*  CO2 23 21*  GLUCOSE 100* 87  BUN 12 8  CREATININE 0.76 0.60  CALCIUM 9.4 8.3*   LFT  Recent Labs  03/28/17 2100 03/29/17 0442  PROT 7.6 5.9*  ALBUMIN 3.9 2.9*  AST 152* 84*  ALT 100* 81*  ALKPHOS 140* 111  BILITOT 1.7* 1.0  BILIDIR 0.8*  --   IBILI 0.9  --    PT/INR  Recent Labs  03/29/17 0442  LABPROT 18.7*  INR 1.54    Studies/Results: Dg Chest 2 View  Result Date: 03/28/2017 CLINICAL DATA:  Chest pain EXAM: CHEST  2 VIEW COMPARISON:  03/25/2017 FINDINGS: Cardiac shadow is stable. The lungs are well aerated bilaterally. No focal infiltrate or sizable effusion is seen. Mild degenerative changes of the thoracic spine are seen. IMPRESSION: No acute abnormality noted.  Electronically Signed   By: Inez Catalina M.D.   On: 03/28/2017 20:12   US Abdomen Limited Ruq  Result Date: 03/29/2017 CLINICAL DATA:  Chest pain.  Known gallstones. EXAM: US ABDOMEN LIMITED - RIGHT UPPER QUADRANT COMPARISON:  03/26/2017 FINDINGS: Gallbladder: As seen on the previous study, there is cholelithiasis with sludge in the gallbladder. Gallbladder  wall today is demonstrating mild focal thickening at 4.2 mm. This represents progression since the previous study. Murphy's sign remains negative. Common bile duct: Diameter: 7.2 mm, mildly dilated. An echogenic structure consistent with a 5 mm stone is demonstrated in the distal common bile duct. This is new since previous study. Liver: No focal lesion identified. Within normal limits in parenchymal echogenicity. IMPRESSION: Persistent cholelithiasis with gallbladder sludge. Gallbladder wall thickness is developing since previous study. New development of mild bile duct dilatation with a 5 mm stone now seen in the distal common bile duct. Electronically Signed   By: Lucienne Capers M.D.   On: 03/29/2017 00:23    Impression: CBD stone Abnormal LFTs, trending down?passed stone Afebrile, no leukocytosis Macrocytic anemia History of Crohn's disease currently in remission  Plan: ERCP with sphincterotomy and possible removal of CBD stone On IV ceftriaxone Cholecystectomy planned in a.m. Discussed the risk and benefits of the procedure in details with the patient. She will be scheduled for the procedure today.   LOS: 0 days   Ronnette Juniper, MD  03/29/2017, 1:56 PM  Pager [phone] If no answer or after 5 PM call [phone]

## 2017-03-29 NOTE — Consult Note (Signed)
Reason for Consult:gallstones Referring Physician: Dr Carlynn Herald is an 66 y.o. female.  HPI: 35 yof with pmh of crohns presented to er earlier this week after having numerous episodes of ruq pain. Was seen and diagnosed with biliary colic. She continues to have episodic postprandial epigastric and ruq pain associated with nausea that had resolved on own.  Yesterday she had episode that was continuous. Nothing making it better, unable to eat/drink.  She previously had been referred to our office but returned to er due to symptoms. No fevers, no emesis.  She underwent repeat US that shows mild gbw thickening, 7.2 mm dilated cbd with 5 mm stone in distal duct.  Bili was 1.7 on admission.  She has an ercp scheduled today already with gi. I was asked to see her for gallstones.   Her psh states elap but this was dx lsc for infertility. Her crohns is managed by Dr Watt Climes and she is doing well. Was on humira but has been off for several months due to shingles and is not on medication now.  She had ct 4/18 that only showed mild terminal ileal thickening.   Past Medical History:  Diagnosis Date  . Allergic rhinitis   . Arthritis   . Crohn disease ([place])   . Crohn's disease ([place])   . H/O bladder problems   . History of endometriosis   . History of small bowel obstruction    2011--  [facility] --  RESOLVED -- NO SURGICAL INTERVENTION  . Osteoporosis   . Pelvic prolapse   . PONV (postoperative nausea and vomiting)    AND HARD TO WAKE  . SUI (stress urinary incontinence, female)     Past Surgical History:  Procedure Laterality Date  . ABDOMINAL HYSTERECTOMY  1980  . ANTERIOR AND POSTERIOR REPAIR  06/13/2013   Procedure:  AUGUEMENTED  RECTOCELE REPAIR WITH XENFORM;  Surgeon: Ailene Rud, MD;  Location: [place];  Service: Urology;;  . BENIGN EXCISION RIGHT BREAST CYST  1996  . BREAST EXCISIONAL BIOPSY Right 1996  . CYSTOSCOPY N/A 06/13/2013   Procedure: CYSTOSCOPY;   Surgeon: Ailene Rud, MD;  Location: [facility];  Service: Urology;  Laterality: N/A;  . EXPLORATORY LAPAROTOMY  EARLY 1970'S   INFERLITITY  . LAPAROSCOPY/ LYSIS ADHESIONS/ LEFT SALPINGOOPHORECTOMY/ RIGHT OVARIAN CYSTECTOMY  06-08-2000  . PUBOVAGINAL SLING N/A 06/13/2013   Procedure: PELVIC FLOOR REPAIR  ,  WITH  UPHOLD  , ANTERIOR VAULT WITH KELLY PLICATION WITH LYNX SLING;  Surgeon: Ailene Rud, MD;  Location: [facility];  Service: Urology;  Laterality: N/A;    Family History  Problem Relation Age of Onset  . Dementia Mother   . Stroke Mother     Social History:  reports that she has quit smoking. Her smoking use included Cigarettes. She has a 30.00 pack-year smoking history. She has never used smokeless tobacco. She reports that she drinks about 4.2 oz of alcohol per week . She reports that she does not use drugs.  Allergies:  Allergies  Allergen Reactions  . Celebrex [Celecoxib] Anaphylaxis  . Prednisone Other (See Comments)    Severe psychosis requiring hospitalization.  Avoid use if possible.    Edd Fabian [Risedronate Sodium] Other (See Comments)    GI UPSET  . Albuterol Other (See Comments)    sobbing  . Ammonium-Containing Compounds   . Boniva [Ibandronic Acid] Other (See Comments)    GI UPSET  . Chantix [Varenicline]  Other (See Comments)    MOOD CHANGE  . Fosamax [Alendronate Sodium] Other (See Comments)    GI UPSET  . Imuran [Azathioprine] Diarrhea and Nausea And Vomiting  . Macrodantin [Nitrofurantoin Macrocrystal]   . Neosporin [Neomycin-Bacitracin Zn-Polymyx] Swelling and Other (See Comments)    REDNESS  . Nitrofuran Derivatives Other (See Comments)    CHILLS AND FEVER  . Other     Pt states allergic to 3 more medicines and doesn't know name of them.  . Penicillins Diarrhea, Nausea And Vomiting and Other (See Comments)  . Prolia [Denosumab] Other (See Comments)    Irritates chrons  . Tobradex  [Tobramycin-Dexamethasone] Other (See Comments)    SEVERE EYE IRRIATION  . Vagifem [Estradiol] Other (See Comments)    bleeding    Medications: I have reviewed the patient's current medications.  Results for orders placed or performed during the hospital encounter of 03/28/17 (from the past 48 hour(s))  Basic metabolic panel     Status: Abnormal   Collection Time: 03/28/17  8:17 PM  Result Value Ref Range   Sodium 137 135 - 145 mmol/L   Potassium 3.9 3.5 - 5.1 mmol/L   Chloride 105 101 - 111 mmol/L   CO2 23 22 - 32 mmol/L   Glucose, Bld 100 (H) 65 - 99 mg/dL   BUN 12 6 - 20 mg/dL   Creatinine, Ser 0.76 0.44 - 1.00 mg/dL   Calcium 9.4 8.9 - 10.3 mg/dL   GFR calc non Af Amer >60 >60 mL/min   GFR calc Af Amer >60 >60 mL/min    Comment: (NOTE) The eGFR has been calculated using the CKD EPI equation. This calculation has not been validated in all clinical situations. eGFR's persistently <60 mL/min signify possible Chronic Kidney Disease.    Anion gap 9 5 - 15  CBC     Status: Abnormal   Collection Time: 03/28/17  8:17 PM  Result Value Ref Range   WBC 4.9 4.0 - 10.5 K/uL   RBC 3.52 (L) 3.87 - 5.11 MIL/uL   Hemoglobin 12.6 12.0 - 15.0 g/dL   HCT 36.8 36.0 - 46.0 %   MCV 104.5 (H) 78.0 - 100.0 fL   MCH 35.8 (H) 26.0 - 34.0 pg   MCHC 34.2 30.0 - 36.0 g/dL   RDW 14.9 11.5 - 15.5 %   Platelets 223 150 - 400 K/uL  Lipase, blood     Status: None   Collection Time: 03/28/17  8:17 PM  Result Value Ref Range   Lipase 42 11 - 51 U/L  POCT i-Stat troponin I     Status: None   Collection Time: 03/28/17  8:29 PM  Result Value Ref Range   Troponin i, poc 0.01 0.00 - 0.08 ng/mL   Comment 3            Comment: Due to the release kinetics of cTnI, a negative result within the first hours of the onset of symptoms does not rule out myocardial infarction with certainty. If myocardial infarction is still suspected, repeat the test at appropriate intervals.   Hepatic function panel      Status: Abnormal   Collection Time: 03/28/17  9:00 PM  Result Value Ref Range   Total Protein 7.6 6.5 - 8.1 g/dL   Albumin 3.9 3.5 - 5.0 g/dL   AST 152 (H) 15 - 41 U/L   ALT 100 (H) 14 - 54 U/L   Alkaline Phosphatase 140 (H) 38 - 126 U/L   Total  Bilirubin 1.7 (H) 0.3 - 1.2 mg/dL   Bilirubin, Direct 0.8 (H) 0.1 - 0.5 mg/dL   Indirect Bilirubin 0.9 0.3 - 0.9 mg/dL  Comprehensive metabolic panel     Status: Abnormal   Collection Time: 03/29/17  4:42 AM  Result Value Ref Range   Sodium 141 135 - 145 mmol/L   Potassium 3.8 3.5 - 5.1 mmol/L   Chloride 113 (H) 101 - 111 mmol/L   CO2 21 (L) 22 - 32 mmol/L   Glucose, Bld 87 65 - 99 mg/dL   BUN 8 6 - 20 mg/dL   Creatinine, Ser 0.60 0.44 - 1.00 mg/dL   Calcium 8.3 (L) 8.9 - 10.3 mg/dL   Total Protein 5.9 (L) 6.5 - 8.1 g/dL   Albumin 2.9 (L) 3.5 - 5.0 g/dL   AST 84 (H) 15 - 41 U/L   ALT 81 (H) 14 - 54 U/L   Alkaline Phosphatase 111 38 - 126 U/L   Total Bilirubin 1.0 0.3 - 1.2 mg/dL   GFR calc non Af Amer >60 >60 mL/min   GFR calc Af Amer >60 >60 mL/min    Comment: (NOTE) The eGFR has been calculated using the CKD EPI equation. This calculation has not been validated in all clinical situations. eGFR's persistently <60 mL/min signify possible Chronic Kidney Disease.    Anion gap 7 5 - 15  CBC WITH DIFFERENTIAL     Status: Abnormal   Collection Time: 03/29/17  4:42 AM  Result Value Ref Range   WBC 2.8 (L) 4.0 - 10.5 K/uL   RBC 2.92 (L) 3.87 - 5.11 MIL/uL   Hemoglobin 10.3 (L) 12.0 - 15.0 g/dL   HCT 30.6 (L) 36.0 - 46.0 %   MCV 104.8 (H) 78.0 - 100.0 fL   MCH 35.3 (H) 26.0 - 34.0 pg   MCHC 33.7 30.0 - 36.0 g/dL   RDW 15.0 11.5 - 15.5 %   Platelets 183 150 - 400 K/uL   Neutrophils Relative % 55 %   Neutro Abs 1.5 (L) 1.7 - 7.7 K/uL   Lymphocytes Relative 33 %   Lymphs Abs 0.9 0.7 - 4.0 K/uL   Monocytes Relative 11 %   Monocytes Absolute 0.3 0.1 - 1.0 K/uL   Eosinophils Relative 1 %   Eosinophils Absolute 0.0 0.0 - 0.7 K/uL    Basophils Relative 0 %   Basophils Absolute 0.0 0.0 - 0.1 K/uL  Protime-INR     Status: Abnormal   Collection Time: 03/29/17  4:42 AM  Result Value Ref Range   Prothrombin Time 18.7 (H) 11.4 - 15.2 seconds   INR 1.54     Dg Chest 2 View  Result Date: 03/28/2017 CLINICAL DATA:  Chest pain EXAM: CHEST  2 VIEW COMPARISON:  03/25/2017 FINDINGS: Cardiac shadow is stable. The lungs are well aerated bilaterally. No focal infiltrate or sizable effusion is seen. Mild degenerative changes of the thoracic spine are seen. IMPRESSION: No acute abnormality noted. Electronically Signed   By: Inez Catalina M.D.   On: 03/28/2017 20:12   US Abdomen Limited Ruq  Result Date: 03/29/2017 CLINICAL DATA:  Chest pain.  Known gallstones. EXAM: US ABDOMEN LIMITED - RIGHT UPPER QUADRANT COMPARISON:  03/26/2017 FINDINGS: Gallbladder: As seen on the previous study, there is cholelithiasis with sludge in the gallbladder. Gallbladder wall today is demonstrating mild focal thickening at 4.2 mm. This represents progression since the previous study. Murphy's sign remains negative. Common bile duct: Diameter: 7.2 mm, mildly dilated. An echogenic structure consistent  with a 5 mm stone is demonstrated in the distal common bile duct. This is new since previous study. Liver: No focal lesion identified. Within normal limits in parenchymal echogenicity. IMPRESSION: Persistent cholelithiasis with gallbladder sludge. Gallbladder wall thickness is developing since previous study. New development of mild bile duct dilatation with a 5 mm stone now seen in the distal common bile duct. Electronically Signed   By: Lucienne Capers M.D.   On: 03/29/2017 00:23    Review of Systems  Constitutional: Negative for chills and fever.  Gastrointestinal: Positive for abdominal pain and nausea. Negative for vomiting.  All other systems reviewed and are negative.  Blood pressure (!) 103/54, pulse 72, temperature 98.4 F (36.9 C), temperature source  Oral, resp. rate 15, height _0  (1.575 m), weight 59 kg (130 lb), SpO2 97 %. Physical Exam  Constitutional: She is oriented to person, place, and time. She appears well-developed and well-nourished.  HENT:  Head: Normocephalic and atraumatic.  Right Ear: External ear normal.  Left Ear: External ear normal.  Eyes: No scleral icterus.  Pupils equal   Cardiovascular: Normal rate, regular rhythm, normal heart sounds and intact distal pulses.   Respiratory: Effort normal and breath sounds normal. She has no wheezes.  GI: Soft. Bowel sounds are normal. There is no tenderness.    Musculoskeletal: Normal range of motion.  Lymphadenopathy:    She has no cervical adenopathy.  Neurological: She is alert and oriented to person, place, and time. She has normal strength.  Skin: Skin is warm and dry.    Assessment/Plan: Choledocholithiasis  Ercp today, plan lap chole tomorrow if does well I discussed the procedure in detail.  We discussed the risks and benefits of a laparoscopic cholecystectomy and possible cholangiogram including, but not limited to bleeding, infection, injury to surrounding structures such as the intestine or liver, bile leak, retained gallstones, need to convert to an open procedure, prolonged diarrhea, blood clots such as  DVT, common bile duct injury, anesthesia risks, and possible need for additional procedures.  The likelihood of improvement in symptoms and return to the patient's normal status is good. We discussed the typical post-operative recovery course.  Isabellah Sobocinski 03/29/2017, 11:32 AM

## 2017-03-29 NOTE — Op Note (Signed)
[facility] Patient Name: Diane [name] Procedure Date: 03/29/2017 MRN: [id number] Attending MD: Ronnette Juniper , MD Date of Birth: August 15, 1951 CSN: [id number] Age: 66 Admit Type: Outpatient Procedure:                ERCP Indications:              For therapy of bile duct stone(s) Providers:                Ronnette Juniper, MD, Carolynn Comment, RN, William Dalton, Technician Referring MD:              Medicines:                Monitored Anesthesia Care Complications:            No immediate complications. Estimated Blood Loss:     Estimated blood loss: none. Procedure:                Pre-Anesthesia Assessment:                           - Prior to the procedure, a History and Physical                            was performed, and patient medications and                            allergies were reviewed. The patient's tolerance of                            previous anesthesia was also reviewed. The risks                            and benefits of the procedure and the sedation                            options and risks were discussed with the patient.                            All questions were answered, and informed consent                            was obtained. Prior Anticoagulants: The patient has                            taken no previous anticoagulant or antiplatelet                            agents. ASA Grade Assessment: II - A patient with                            mild systemic disease. After reviewing the risks  and benefits, the patient was deemed in                            satisfactory condition to undergo the procedure.                           After obtaining informed consent, the scope was                            passed under direct vision. Throughout the                            procedure, the patient's blood pressure, pulse, and                            oxygen saturations were monitored  continuously. The                            [id number] ([id number]) scope was introduced through                            the mouth, and used to inject contrast into and                            used to inject contrast into the bile duct. The                            ERCP was accomplished without difficulty. The                            patient tolerated the procedure well. Findings:      The scout film was normal. The esophagus was successfully intubated       under direct vision. The scope was advanced to a normal major papilla in       the descending duodenum without detailed examination of the pharynx,       larynx and associated structures, and upper GI tract. The upper GI tract       was grossly normal. The bile duct was deeply cannulated with the regular       tipped cannula. Contrast was injected. I personally interpreted the bile       duct images. There was brisk flow of contrast through the ducts. Image       quality was excellent. Contrast extended to the entire biliary tree. The       main bile duct was normal. Despite several balloon sweeps, no stone or       sludge was retrieved. Bile was noted freely flowing through the ampulla       post sphincterotomy. A straight Roadrunner wire was passed into the       biliary tree. A 12 mm biliary sphincterotomy was made with a braided       sphincterotome using ERBE electrocautery. There was no       post-sphincterotomy bleeding. The biliary tree was swept with a basket       72m amd 12 mm starting at the bifurcation. Nothing was found. Despite  several balloon sweeps, no stone or sludge was retrieved. Bile was noted       freely flowing through the ampulla post sphincterotomy. Impression:               - A biliary sphincterotomy was performed.                           - The biliary tree was swept and nothing was found. Moderate Sedation:      Patient did not receive moderate sedation for this procedure, but       instead  received monitored anesthesia care. Recommendation:           - Clear liquid diet in 4 hours if no abdominal                            pain, then NPO post midnight for cholecystectomy in                            am. Procedure Code(s):        --- Professional ---                           [phone], Endoscopic retrograde                            cholangiopancreatography (ERCP); with                            sphincterotomy/papillotomy                           [phone], Endoscopic catheterization of the biliary                            ductal system, radiological supervision and                            interpretation Diagnosis Code(s):        --- Professional ---                           K80.50, Calculus of bile duct without cholangitis                            or cholecystitis without obstruction CPT copyright 2016 American [employer] Association. All rights reserved. The codes documented in this report are preliminary and upon coder review may  be revised to meet current compliance requirements. Ronnette Juniper, MD 03/29/2017 4:03:34 PM This report has been signed electronically. Number of Addenda: 0

## 2017-03-29 NOTE — Brief Op Note (Signed)
03/28/2017 - 03/29/2017  3:51 PM  PATIENT:  Ricci Barker Durney  66 y.o. female  PRE-OPERATIVE DIAGNOSIS:  CBD stone  POST-OPERATIVE DIAGNOSIS:  * No post-op diagnosis entered *  PROCEDURE:  Procedure(s) with comments: ENDOSCOPIC RETROGRADE CHOLANGIOPANCREATOGRAPHY (ERCP) WITH PROPOFOL (N/A) - prone position  SURGEON:  Surgeon(s) and Role:    Ronnette Juniper, MD - Primary  PHYSICIAN ASSISTANT:   ASSISTANTS: none   ANESTHESIA:   MAC  EBL:  Total I/O In: 18.3 [I.V.:18.3] Out: 700 [Urine:700]  BLOOD ADMINISTERED:none  DRAINS: none   LOCAL MEDICATIONS USED:  NONE  SPECIMEN:  No Specimen  DISPOSITION OF SPECIMEN:  N/A  COUNTS:  YES  TOURNIQUET:  * No tourniquets in log *  DICTATION: .Dragon Dictation  PLAN OF CARE: Admit to inpatient   PATIENT DISPOSITION:  PACU - hemodynamically stable.   Delay start of Pharmacological VTE agent (>24hrs) due to surgical blood loss or risk of bleeding: no

## 2017-03-29 NOTE — Progress Notes (Signed)
MEDICATION RELATED CONSULT NOTE - INITIAL   Pharmacy Consult for Mercaptopurine Indication: Crohn's  Allergies  Allergen Reactions  . Celebrex [Celecoxib] Anaphylaxis  . Prednisone Other (See Comments)    Severe psychosis requiring hospitalization.  Avoid use if possible.    Edd Fabian [Risedronate Sodium] Other (See Comments)    GI UPSET  . Albuterol Other (See Comments)    sobbing  . Ammonium-Containing Compounds   . Boniva [Ibandronic Acid] Other (See Comments)    GI UPSET  . Chantix [Varenicline] Other (See Comments)    MOOD CHANGE  . Fosamax [Alendronate Sodium] Other (See Comments)    GI UPSET  . Imuran [Azathioprine] Diarrhea and Nausea And Vomiting  . Macrodantin [Nitrofurantoin Macrocrystal]   . Neosporin [Neomycin-Bacitracin Zn-Polymyx] Swelling and Other (See Comments)    REDNESS  . Nitrofuran Derivatives Other (See Comments)    CHILLS AND FEVER  . Other     Pt states allergic to 3 more medicines and doesn't know name of them.  . Penicillins Diarrhea, Nausea And Vomiting and Other (See Comments)  . Prolia [Denosumab] Other (See Comments)    Irritates chrons  . Tobradex [Tobramycin-Dexamethasone] Other (See Comments)    SEVERE EYE IRRIATION  . Vagifem [Estradiol] Other (See Comments)    bleeding    Patient Measurements: Height: 5' 2"  (157.5 cm) Weight: 130 lb (59 kg) IBW/kg (Calculated) : 50.1 Adjusted Body Weight:   Vital Signs: Temp: 98.7 F (37.1 C) (05/27 0053) Temp Source: Oral (05/27 0053) BP: 117/62 (05/27 0053) Pulse Rate: 75 (05/27 0053) Intake/Output from previous day: 05/26 0701 - 05/27 0700 In: 1043.3 [I.V.:43.3; IV Piggyback:1000] Out: 320 [Urine:320] Intake/Output from this shift: Total I/O In: 1043.3 [I.V.:43.3; IV Piggyback:1000] Out: 320 [Urine:320]  Labs:  Recent Labs  03/28/17 2017 03/28/17 2100  WBC 4.9  --   HGB 12.6  --   HCT 36.8  --   PLT 223  --   CREATININE 0.76  --   ALBUMIN  --  3.9  PROT  --  7.6  AST  --   152*  ALT  --  100*  ALKPHOS  --  140*  BILITOT  --  1.7*  BILIDIR  --  0.8*  IBILI  --  0.9   Estimated Creatinine Clearance: 54.7 mL/min (by C-G formula based on SCr of 0.76 mg/dL).   Microbiology: No results found for this or any previous visit (from the past 720 hour(s)).  Medical History: Past Medical History:  Diagnosis Date  . Allergic rhinitis   . Arthritis   . Crohn disease ([place])   . Crohn's disease ([place])   . H/O bladder problems   . History of endometriosis   . History of small bowel obstruction    2011--  [facility] --  RESOLVED -- NO SURGICAL INTERVENTION  . Osteoporosis   . Pelvic prolapse   . PONV (postoperative nausea and vomiting)    AND HARD TO WAKE  . SUI (stress urinary incontinence, female)     Medications:  Prescriptions Prior to Admission  Medication Sig Dispense Refill Last Dose  . acetaminophen (TYLENOL) 500 MG tablet Take 500 mg by mouth every 6 (six) hours as needed for mild pain.   Past Month at Unknown time  . Calcium Carb-Cholecalciferol (CALCIUM + D3 PO) Take 2 tablets by mouth daily.   03/28/2017 at Unknown time  . folic acid (FOLVITE) 1 MG tablet Take 1 mg by mouth daily.   Past Week at Unknown time  . mercaptopurine (  PURINETHOL) 50 MG tablet Take 75 mg by mouth daily. Give on an empty stomach 1 hour before or 2 hours after meals. Caution: Chemotherapy.   03/28/2017 at Unknown time  . Polyethyl Glycol-Propyl Glycol (SYSTANE OP) Place 2 drops into both eyes 2 (two) times daily.    Past Week at Unknown time  . Probiotic Product (PROBIOTIC DAILY PO) Take 1 tablet by mouth daily. Align   Past Week at Unknown time  . [DISCONTINUED] haloperidol (HALDOL) 2 MG tablet Take 1 tablet (2 mg total) by mouth 2 (two) times daily. (Patient not taking: Reported on 03/26/2017) 60 tablet 0 Not Taking at Unknown time   Scheduled:  . acidophilus  1 capsule Oral Daily  . folic acid  1 mg Oral Daily  . polyvinyl alcohol  1 drop Both Eyes BID     Assessment: Mercaptopurine (Purinethol) Hgb < 8 WBC < 3000 Pltc < 100K AST or ALT > 3x ULN Bili > 1.5x ULN  5/26 AST 152 which is >3x ULN (41). Therefore hold medication.  Goal of Therapy:  Safe and effective use of Mercaptopurine  Plan:  D/c medication at this time. Follow with labs  Nani Skillern Crowford 03/29/2017,2:41 AM

## 2017-03-29 NOTE — Anesthesia Procedure Notes (Signed)
Procedure Name: Intubation Date/Time: 03/29/2017 3:27 PM Performed by: Danley Danker L Patient Re-evaluated:Patient Re-evaluated prior to inductionOxygen Delivery Method: Circle system utilized Preoxygenation: Pre-oxygenation with 100% oxygen Intubation Type: IV induction Ventilation: Mask ventilation without difficulty and Oral airway inserted - appropriate to patient size Laryngoscope Size: Glidescope and 4 Grade View: Grade I Tube type: Oral Tube size: 6.5 mm Number of attempts: 3 Airway Equipment and Method: Stylet Placement Confirmation: ETT inserted through vocal cords under direct vision,  positive ETCO2 and breath sounds checked- equal and bilateral Secured at: 21 cm Tube secured with: Tape Dental Injury: Bloody posterior oropharynx  Difficulty Due To: Difficult Airway- due to anterior larynx and Difficulty was unanticipated Comments: Anterior airway attempted with Sabra Heck 2 by both MD and CRNA.  To glidescope.

## 2017-03-29 NOTE — Anesthesia Preprocedure Evaluation (Addendum)
Anesthesia Evaluation  Patient identified by MRN, date of birth, ID band Patient awake    Reviewed: Allergy & Precautions, H&P , NPO status , Patient's Chart, lab work & pertinent test results  History of Anesthesia Complications (+) PONV  Airway Mallampati: II  TM Distance: >3 FB Neck ROM: Full    Dental no notable dental hx. (+) Teeth Intact, Dental Advisory Given   Pulmonary neg pulmonary ROS, former smoker,    Pulmonary exam normal breath sounds clear to auscultation       Cardiovascular Exercise Tolerance: Good negative cardio ROS   Rhythm:Regular Rate:Normal     Neuro/Psych negative neurological ROS  negative psych ROS   GI/Hepatic negative GI ROS, Neg liver ROS,   Endo/Other  negative endocrine ROS  Renal/GU negative Renal ROS  negative genitourinary   Musculoskeletal  (+) Arthritis , Osteoarthritis,    Abdominal   Peds  Hematology negative hematology ROS (+)   Anesthesia Other Findings   Reproductive/Obstetrics negative OB ROS                            Anesthesia Physical Anesthesia Plan  ASA: II  Anesthesia Plan: General   Post-op Pain Management:    Induction: Intravenous  Airway Management Planned: Oral ETT and Video Laryngoscope Planned  Additional Equipment:   Intra-op Plan:   Post-operative Plan: Extubation in OR  Informed Consent: I have reviewed the patients History and Physical, chart, labs and discussed the procedure including the risks, benefits and alternatives for the proposed anesthesia with the patient or authorized representative who has indicated his/her understanding and acceptance.   Dental advisory given  Plan Discussed with: CRNA  Anesthesia Plan Comments:        Anesthesia Quick Evaluation

## 2017-03-30 ENCOUNTER — Encounter (HOSPITAL_COMMUNITY): Payer: Self-pay | Admitting: Certified Registered Nurse Anesthetist

## 2017-03-30 ENCOUNTER — Encounter (HOSPITAL_COMMUNITY)
Admission: EM | Disposition: A | Discharge status: Home / Self Care | Payer: Self-pay | Source: Home / Self Care | Attending: Internal Medicine

## 2017-03-30 ENCOUNTER — Inpatient Hospital Stay (HOSPITAL_COMMUNITY): Payer: Medicare Other | Admitting: Anesthesiology

## 2017-03-30 HISTORY — PX: CHOLECYSTECTOMY: SHX55

## 2017-03-30 LAB — COMPREHENSIVE METABOLIC PANEL
ALT: 77 U/L — ABNORMAL HIGH (ref 14–54)
ANION GAP: 7 (ref 5–15)
AST: 62 U/L — ABNORMAL HIGH (ref 15–41)
Albumin: 3.5 g/dL (ref 3.5–5.0)
Alkaline Phosphatase: 128 U/L — ABNORMAL HIGH (ref 38–126)
BUN: 7 mg/dL (ref 6–20)
CALCIUM: 8.8 mg/dL — AB (ref 8.9–10.3)
CHLORIDE: 109 mmol/L (ref 101–111)
CO2: 24 mmol/L (ref 22–32)
Creatinine, Ser: 0.76 mg/dL (ref 0.44–1.00)
GFR calc non Af Amer: >60 mL/min (ref >60)
Glucose, Bld: 81 mg/dL (ref 65–99)
POTASSIUM: 4.1 mmol/L (ref 3.5–5.1)
SODIUM: 140 mmol/L (ref 135–145)
Total Bilirubin: 1.1 mg/dL (ref 0.3–1.2)
Total Protein: 7.3 g/dL (ref 6.5–8.1)

## 2017-03-30 LAB — CBC WITH DIFFERENTIAL/PLATELET
BASOS PCT: 0 %
Basophils Absolute: 0 10*3/uL (ref 0.0–0.1)
EOS ABS: 0 10*3/uL (ref 0.0–0.7)
Eosinophils Relative: 1 %
HCT: 35.4 % — ABNORMAL LOW (ref 36.0–46.0)
Hemoglobin: 12 g/dL (ref 12.0–15.0)
LYMPHS ABS: 1.6 10*3/uL (ref 0.7–4.0)
Lymphocytes Relative: 38 %
MCH: 35.9 pg — AB (ref 26.0–34.0)
MCHC: 33.9 g/dL (ref 30.0–36.0)
MCV: 106 fL — ABNORMAL HIGH (ref 78.0–100.0)
MONO ABS: 0.4 10*3/uL (ref 0.1–1.0)
MONOS PCT: 9 %
Neutro Abs: 2.1 10*3/uL (ref 1.7–7.7)
Neutrophils Relative %: 52 %
PLATELETS: 199 10*3/uL (ref 150–400)
RBC: 3.34 MIL/uL — ABNORMAL LOW (ref 3.87–5.11)
RDW: 15.1 % (ref 11.5–15.5)
WBC: 4.1 10*3/uL (ref 4.0–10.5)

## 2017-03-30 LAB — MAGNESIUM: Magnesium: 1.9 mg/dL (ref 1.7–2.4)

## 2017-03-30 LAB — SURGICAL PCR SCREEN
MRSA, PCR: NEGATIVE
STAPHYLOCOCCUS AUREUS: NEGATIVE

## 2017-03-30 SURGERY — LAPAROSCOPIC CHOLECYSTECTOMY WITH INTRAOPERATIVE CHOLANGIOGRAM
Anesthesia: General | Site: Abdomen

## 2017-03-30 MED ORDER — SODIUM CHLORIDE 0.9 % IV SOLN
INTRAVENOUS | Status: DC
  Administered 2017-03-30: 17:00:00 via INTRAVENOUS

## 2017-03-30 MED ORDER — PHENYLEPHRINE 40 MCG/ML (10ML) SYRINGE FOR IV PUSH (FOR BLOOD PRESSURE SUPPORT)
PREFILLED_SYRINGE | INTRAVENOUS | Status: DC | PRN
  Administered 2017-03-30 (×2): 40 ug via INTRAVENOUS

## 2017-03-30 MED ORDER — HYDROMORPHONE HCL 1 MG/ML IJ SOLN
0.2500 mg | INTRAMUSCULAR | Status: DC | PRN
  Administered 2017-03-30 (×2): 0.5 mg via INTRAVENOUS

## 2017-03-30 MED ORDER — PROPOFOL 10 MG/ML IV BOLUS
INTRAVENOUS | Status: DC | PRN
Start: 2017-03-30 — End: 2017-03-30
  Administered 2017-03-30: 140 mg via INTRAVENOUS

## 2017-03-30 MED ORDER — LIDOCAINE 2% (20 MG/ML) 5 ML SYRINGE
INTRAMUSCULAR | Status: AC
  Filled 2017-03-30: qty 5

## 2017-03-30 MED ORDER — SUCCINYLCHOLINE CHLORIDE 200 MG/10ML IV SOSY
PREFILLED_SYRINGE | INTRAVENOUS | Status: AC
  Filled 2017-03-30: qty 10

## 2017-03-30 MED ORDER — 0.9 % SODIUM CHLORIDE (POUR BTL) OPTIME
TOPICAL | Status: DC | PRN
  Administered 2017-03-30: 1000 mL

## 2017-03-30 MED ORDER — OXYCODONE-ACETAMINOPHEN 5-325 MG PO TABS: 1.0000 | ORAL_TABLET | ORAL | Status: DC | PRN

## 2017-03-30 MED ORDER — EPHEDRINE SULFATE-NACL 50-0.9 MG/10ML-% IV SOSY
PREFILLED_SYRINGE | INTRAVENOUS | Status: DC | PRN
  Administered 2017-03-30: 5 mg via INTRAVENOUS

## 2017-03-30 MED ORDER — ONDANSETRON HCL 4 MG/2ML IJ SOLN
INTRAMUSCULAR | Status: DC | PRN
  Administered 2017-03-30: 4 mg via INTRAVENOUS

## 2017-03-30 MED ORDER — SUGAMMADEX SODIUM 200 MG/2ML IV SOLN
INTRAVENOUS | Status: DC | PRN
  Administered 2017-03-30: 150 mg via INTRAVENOUS

## 2017-03-30 MED ORDER — SUGAMMADEX SODIUM 200 MG/2ML IV SOLN
INTRAVENOUS | Status: AC
  Filled 2017-03-30: qty 2

## 2017-03-30 MED ORDER — ONDANSETRON HCL 4 MG/2ML IJ SOLN
INTRAMUSCULAR | Status: AC
  Filled 2017-03-30: qty 2

## 2017-03-30 MED ORDER — MENTHOL 3 MG MT LOZG
1.0000 | LOZENGE | OROMUCOSAL | Status: DC | PRN
  Filled 2017-03-30: qty 9

## 2017-03-30 MED ORDER — FENTANYL CITRATE (PF) 250 MCG/5ML IJ SOLN
INTRAMUSCULAR | Status: AC
  Filled 2017-03-30: qty 5

## 2017-03-30 MED ORDER — MERCAPTOPURINE 50 MG PO TABS
75.0000 mg | ORAL_TABLET | Freq: Every day | ORAL | Status: DC
  Administered 2017-03-31: 75 mg via ORAL
  Filled 2017-03-30: qty 2

## 2017-03-30 MED ORDER — FENTANYL CITRATE (PF) 100 MCG/2ML IJ SOLN
INTRAMUSCULAR | Status: DC | PRN
  Administered 2017-03-30: 100 ug via INTRAVENOUS
  Administered 2017-03-30 (×5): 50 ug via INTRAVENOUS

## 2017-03-30 MED ORDER — SODIUM CHLORIDE 0.9 % IV SOLN
INTRAVENOUS | Status: DC | PRN
  Administered 2017-03-30: 09:00:00 via INTRAVENOUS

## 2017-03-30 MED ORDER — BUPIVACAINE-EPINEPHRINE 0.25% -1:200000 IJ SOLN
INTRAMUSCULAR | Status: DC | PRN
  Administered 2017-03-30: 10 mL

## 2017-03-30 MED ORDER — ROCURONIUM BROMIDE 50 MG/5ML IV SOSY
PREFILLED_SYRINGE | INTRAVENOUS | Status: DC | PRN
  Administered 2017-03-30: 40 mg via INTRAVENOUS

## 2017-03-30 MED ORDER — PHENYLEPHRINE 40 MCG/ML (10ML) SYRINGE FOR IV PUSH (FOR BLOOD PRESSURE SUPPORT)
PREFILLED_SYRINGE | INTRAVENOUS | Status: AC
  Filled 2017-03-30: qty 10

## 2017-03-30 MED ORDER — EPHEDRINE 5 MG/ML INJ
INTRAVENOUS | Status: AC
  Filled 2017-03-30: qty 10

## 2017-03-30 MED ORDER — SUCCINYLCHOLINE CHLORIDE 200 MG/10ML IV SOSY
PREFILLED_SYRINGE | INTRAVENOUS | Status: DC | PRN
  Administered 2017-03-30: 100 mg via INTRAVENOUS

## 2017-03-30 MED ORDER — HYDROMORPHONE HCL 1 MG/ML IJ SOLN
INTRAMUSCULAR | Status: AC
  Filled 2017-03-30: qty 1

## 2017-03-30 MED ORDER — LIDOCAINE 2% (20 MG/ML) 5 ML SYRINGE
INTRAMUSCULAR | Status: DC | PRN
  Administered 2017-03-30: 60 mg via INTRAVENOUS

## 2017-03-30 MED ORDER — BUPIVACAINE HCL (PF) 0.25 % IJ SOLN
INTRAMUSCULAR | Status: AC
  Filled 2017-03-30: qty 30

## 2017-03-30 MED ORDER — LACTATED RINGERS IV SOLN: INTRAVENOUS | Status: DC

## 2017-03-30 MED ORDER — ROCURONIUM BROMIDE 50 MG/5ML IV SOSY
PREFILLED_SYRINGE | INTRAVENOUS | Status: AC
  Filled 2017-03-30: qty 5

## 2017-03-30 MED ORDER — FENTANYL CITRATE (PF) 100 MCG/2ML IJ SOLN
INTRAMUSCULAR | Status: AC
  Filled 2017-03-30: qty 2

## 2017-03-30 MED ORDER — PROPOFOL 10 MG/ML IV BOLUS
INTRAVENOUS | Status: AC
  Filled 2017-03-30: qty 20

## 2017-03-30 MED ORDER — LACTATED RINGERS IV SOLN
INTRAVENOUS | Status: DC | PRN
  Administered 2017-03-30: 11:00:00 via INTRAVENOUS

## 2017-03-30 MED ORDER — RINGERS IRRIGATION IR SOLN
Status: DC | PRN
  Administered 2017-03-30: 1

## 2017-03-30 SURGICAL SUPPLY — 49 items
ADH SKN CLS APL DERMABOND .7 (GAUZE/BANDAGES/DRESSINGS) ×1
APL SKNCLS STERI-STRIP NONHPOA (GAUZE/BANDAGES/DRESSINGS)
APPLIER CLIP 5 13 M/L LIGAMAX5 (MISCELLANEOUS) ×2
APPLIER CLIP ROT 10 11.4 M/L (STAPLE)
APR CLP MED LRG 11.4X10 (STAPLE)
APR CLP MED LRG 5 ANG JAW (MISCELLANEOUS) ×1
BAG SPEC RTRVL 10 TROC 200 (ENDOMECHANICALS) ×1
BENZOIN TINCTURE PRP APPL 2/3 (GAUZE/BANDAGES/DRESSINGS) IMPLANT
CABLE HIGH FREQUENCY MONO STRZ (ELECTRODE) ×3 IMPLANT
CHLORAPREP W/TINT 26ML (MISCELLANEOUS) ×2 IMPLANT
CLIP APPLIE 5 13 M/L LIGAMAX5 (MISCELLANEOUS) ×1 IMPLANT
CLIP APPLIE ROT 10 11.4 M/L (STAPLE) IMPLANT
COVER MAYO STAND STRL (DRAPES) IMPLANT
COVER SURGICAL LIGHT HANDLE (MISCELLANEOUS) ×2 IMPLANT
DECANTER SPIKE VIAL GLASS SM (MISCELLANEOUS) ×2 IMPLANT
DERMABOND ADVANCED (GAUZE/BANDAGES/DRESSINGS) ×1
DERMABOND ADVANCED .7 DNX12 (GAUZE/BANDAGES/DRESSINGS) IMPLANT
DEVICE TROCAR PUNCTURE CLOSURE (ENDOMECHANICALS) ×1 IMPLANT
DRAPE C-ARM 42X120 X-RAY (DRAPES) IMPLANT
ELECT REM PT RETURN 15FT ADLT (MISCELLANEOUS) ×2 IMPLANT
GLOVE BIO SURGEON STRL SZ7 (GLOVE) ×2 IMPLANT
GLOVE BIOGEL PI IND STRL 7.5 (GLOVE) ×1 IMPLANT
GLOVE BIOGEL PI INDICATOR 7.5 (GLOVE) ×1
GLOVE SURG SIGNA 7.5 PF LTX (GLOVE) ×1 IMPLANT
GLOVE SURG SS PI 8.5 STRL IVOR (GLOVE) ×1
GLOVE SURG SS PI 8.5 STRL STRW (GLOVE) IMPLANT
GOWN STRL REUS W/TWL LRG LVL3 (GOWN DISPOSABLE) ×5 IMPLANT
GOWN STRL REUS W/TWL XL LVL3 (GOWN DISPOSABLE) ×3 IMPLANT
HEMOSTAT SNOW SURGICEL 2X4 (HEMOSTASIS) ×1 IMPLANT
IRRIG SUCT STRYKERFLOW 2 WTIP (MISCELLANEOUS) ×2
IRRIGATION SUCT STRKRFLW 2 WTP (MISCELLANEOUS) ×1 IMPLANT
KIT BASIN OR (CUSTOM PROCEDURE TRAY) ×2 IMPLANT
POSITIONER SURGICAL ARM (MISCELLANEOUS) IMPLANT
POUCH RETRIEVAL ECOSAC 10 (ENDOMECHANICALS) ×1 IMPLANT
POUCH RETRIEVAL ECOSAC 10MM (ENDOMECHANICALS) ×1
SCISSORS LAP 5X35 DISP (ENDOMECHANICALS) ×2 IMPLANT
SET CHOLANGIOGRAPH MIX (MISCELLANEOUS) IMPLANT
SLEEVE XCEL OPT CAN 5 100 (ENDOMECHANICALS) ×4 IMPLANT
STRIP CLOSURE SKIN 1/2X4 (GAUZE/BANDAGES/DRESSINGS) ×1 IMPLANT
SUT MNCRL AB 4-0 PS2 18 (SUTURE) ×2 IMPLANT
SUT VICRYL 0 TIES 12 18 (SUTURE) IMPLANT
SUT VICRYL 0 UR6 27IN ABS (SUTURE) IMPLANT
TOWEL OR 17X26 10 PK STRL BLUE (TOWEL DISPOSABLE) ×2 IMPLANT
TOWEL OR NON WOVEN STRL DISP B (DISPOSABLE) ×2 IMPLANT
TRAY LAPAROSCOPIC (CUSTOM PROCEDURE TRAY) ×2 IMPLANT
TROCAR BLADELESS OPT 5 100 (ENDOMECHANICALS) ×2 IMPLANT
TROCAR XCEL BLUNT TIP 100MML (ENDOMECHANICALS) ×2 IMPLANT
TROCAR XCEL NON-BLD 11X100MML (ENDOMECHANICALS) IMPLANT
TUBING INSUF HEATED (TUBING) ×2 IMPLANT

## 2017-03-30 NOTE — Progress Notes (Addendum)
Patient ID: Diane [name], female   DOB: [birth year], 66 y.o.   MRN: [id number]  PROGRESS NOTE    Diane [name]  IDP:[id number] DOB: 01-26-1951 DOA: 03/28/2017 PCP: Antony Blackbird, MD   Brief Narrative:  66 y.o.femalewith medical history significant for Crohn's disease and history of SBO that resolved with conservative management, presented with  recurrent, severe postprandial epigastric pain with nausea and one episode of vomiting. She was found to have cholelithiasis with CBD dilation and distal CBD stone along with gallbladder wall thickening. GI and general surgery was consulted. Patient had ERCP with sphincterotomy done on 03/29/2017 and cholangiogram revealed no filling defects. She had laparoscopic cholecystectomy done today.  Assessment & Plan:   Principal Problem:   Choledocholithiasis with obstruction Active Problems:   Crohn's disease ([place])  1. Choledocholithiasis with mild acute transaminitis - Status post ERCP with  sphincterotomy done on 03/29/2017 and cholangiogram revealed no filling defects. Patient might have already passed the stone - Repeat LFTs are improving  2. Cholelithiasis with gallbladder thickening - Status post laparoscopic cholecystectomy by surgery today. Patient has been started on a diet to be advanced as tolerated.  - Probable discharge home tomorrow if cleared by general surgery.  2. Crohn's disease  - Pt reports this to be stable  - She had been on weekly Humira until suffering zoster a few months ago, at which point it was stopped  - She has continued with mercaptopurine and reports that her gastroenterologist was very pleased with her last CT in April 2018 - Restart mercaptopurine    DVT prophylaxis: SCDs Code Status:  Full Family Communication: None at bedside Disposition Plan: Home in 2-3 days  Consultants: GI and general surgery  Procedures: ERCP with  sphincterotomy done on 03/29/2017; cholangiogram revealed no filling  defects Laparoscopic cholecystectomy on 03/30/2017  Antimicrobials: Rocephin from 03/29/2017 Subjective: Patient seen and examined at bedside. She came back from her surgery a little white ago. She complains of some right upper quadrant abdominal pain with some bloating sensation. No nausea or fever.  Objective: Vitals:   03/30/17 1158 03/30/17 1200 03/30/17 1310 03/30/17 1349  BP: 109/72  (!) 92/58 (!) 108/56  Pulse: 78  72 81  Resp: 12  16 16   Temp: 98.5 F (36.9 C)  98.4 F (36.9 C) 97.9 F (36.6 C)  TempSrc: Oral  Oral Axillary  SpO2: 94% 100% 100% 100%  Weight:      Height:        Intake/Output Summary (Last 24 hours) at 03/30/17 1408 Last data filed at 03/30/17 1145  Gross per 24 hour  Intake             1910 ml  Output             1855 ml  Net               55 ml   Filed Weights   03/28/17 1945  Weight: 59 kg (130 lb)    Examination:  General exam: Appears calm and comfortable  Respiratory system: Bilateral decreased breath sound at bases Cardiovascular system: S1 & S2 heard, Rate controlled  Gastrointestinal system: Abdomen is Soft, mildly tender over the periumbilical and right upper quadrant region. Nondistended. Normal bowel sounds heard. Extremities: No cyanosis, clubbing, edema    Data Reviewed: I have personally reviewed following labs and imaging studies  CBC:  Recent Labs Lab 03/25/17 2311 03/28/17 2017 03/29/17 0442 03/30/17 0432  WBC 3.8* 4.9 2.8* 4.1  NEUTROABS  --   --  1.5* 2.1  HGB 12.3 12.6 10.3* 12.0  HCT 35.4* 36.8 30.6* 35.4*  MCV 104.1* 104.5* 104.8* 106.0*  PLT 244 223 183 314   Basic Metabolic Panel:  Recent Labs Lab 03/25/17 2311 03/28/17 2017 03/29/17 0442 03/30/17 0432  NA 137 137 141 140  K 3.5 3.9 3.8 4.1  CL 103 105 113* 109  CO2 27 23 21* 24  GLUCOSE 113* 100* 87 81  BUN 14 12 8 7   CREATININE 0.90 0.76 0.60 0.76  CALCIUM 9.1 9.4 8.3* 8.8*  MG  --   --   --  1.9   GFR: Estimated Creatinine Clearance:  54.7 mL/min (by C-G formula based on SCr of 0.76 mg/dL). Liver Function Tests:  Recent Labs Lab 03/25/17 2311 03/28/17 2100 03/29/17 0442 03/30/17 0432  AST 45* 152* 84* 62*  ALT 51 100* 81* 77*  ALKPHOS 79 140* 111 128*  BILITOT 0.8 1.7* 1.0 1.1  PROT 7.4 7.6 5.9* 7.3  ALBUMIN 3.7 3.9 2.9* 3.5    Recent Labs Lab 03/25/17 2311 03/28/17 2017  LIPASE 35 42   No results for input(s): AMMONIA in the last 168 hours. Coagulation Profile:  Recent Labs Lab 03/29/17 0442  INR 1.54   Cardiac Enzymes: No results for input(s): CKTOTAL, CKMB, CKMBINDEX, TROPONINI in the last 168 hours. BNP (last 3 results) No results for input(s): PROBNP in the last 8760 hours. HbA1C: No results for input(s): HGBA1C in the last 72 hours. CBG: No results for input(s): GLUCAP in the last 168 hours. Lipid Profile: No results for input(s): CHOL, HDL, LDLCALC, TRIG, CHOLHDL, LDLDIRECT in the last 72 hours. Thyroid Function Tests: No results for input(s): TSH, T4TOTAL, FREET4, T3FREE, THYROIDAB in the last 72 hours. Anemia Panel: No results for input(s): VITAMINB12, FOLATE, FERRITIN, TIBC, IRON, RETICCTPCT in the last 72 hours. Sepsis Labs: No results for input(s): PROCALCITON, LATICACIDVEN in the last 168 hours.  Recent Results (from the past 240 hour(s))  Surgical PCR screen     Status: None   Collection Time: 03/30/17  4:21 AM  Result Value Ref Range Status   MRSA, PCR NEGATIVE NEGATIVE Final   Staphylococcus aureus NEGATIVE NEGATIVE Final    Comment:        The Xpert SA Assay (FDA approved for NASAL specimens in patients over 45 years of age), is one component of a comprehensive surveillance program.  Test performance has been validated by [facility] for patients greater than or equal to 31 year old. It is not intended to diagnose infection nor to guide or monitor treatment.          Radiology Studies: Dg Chest 2 View  Result Date: 03/28/2017 CLINICAL DATA:  Chest pain  EXAM: CHEST  2 VIEW COMPARISON:  03/25/2017 FINDINGS: Cardiac shadow is stable. The lungs are well aerated bilaterally. No focal infiltrate or sizable effusion is seen. Mild degenerative changes of the thoracic spine are seen. IMPRESSION: No acute abnormality noted. Electronically Signed   By: Inez Catalina M.D.   On: 03/28/2017 20:12   Dg Ercp Biliary & Pancreatic Ducts  Result Date: 03/30/2017 CLINICAL DATA:  66 year old female with a history of common bile duct stone EXAM: ERCP TECHNIQUE: Multiple spot images obtained with the fluoroscopic device and submitted for interpretation post-procedure. FLUOROSCOPY TIME:  Fluoroscopy Time:  1 minutes 38 seconds COMPARISON:  None FINDINGS: Limited intraoperative fluoroscopic spot images during ERCP. Initial image demonstrates the endoscope projecting over the upper abdomen with cannulation of the ampulla. Subsequent images demonstrate partial  opacification of the extrahepatic biliary ducts. Rounded vague filling defect at the proximal common bile duct present on single image, absent on final image. Evidence of a balloon trawl deployment. IMPRESSION: Limited images during ERCP demonstrates a vague filling defect at the proximal common bile duct, potentially retained stones/debris or alternatively air bubbles. This filling defect not present on the final image after treatment. Please refer to the dictated operative report for full details of intraoperative findings and procedure. Electronically Signed   By: Corrie Mckusick D.O.   On: 03/30/2017 08:11   US Abdomen Limited Ruq  Result Date: 03/29/2017 CLINICAL DATA:  Chest pain.  Known gallstones. EXAM: US ABDOMEN LIMITED - RIGHT UPPER QUADRANT COMPARISON:  03/26/2017 FINDINGS: Gallbladder: As seen on the previous study, there is cholelithiasis with sludge in the gallbladder. Gallbladder wall today is demonstrating mild focal thickening at 4.2 mm. This represents progression since the previous study. Murphy's sign remains  negative. Common bile duct: Diameter: 7.2 mm, mildly dilated. An echogenic structure consistent with a 5 mm stone is demonstrated in the distal common bile duct. This is new since previous study. Liver: No focal lesion identified. Within normal limits in parenchymal echogenicity. IMPRESSION: Persistent cholelithiasis with gallbladder sludge. Gallbladder wall thickness is developing since previous study. New development of mild bile duct dilatation with a 5 mm stone now seen in the distal common bile duct. Electronically Signed   By: Lucienne Capers M.D.   On: 03/29/2017 00:23        Scheduled Meds: . acidophilus  1 capsule Oral Daily  . folic acid  1 mg Oral Daily  . HYDROmorphone      . [START ON 03/31/2017] mercaptopurine  75 mg Oral Daily  . polyvinyl alcohol  1 drop Both Eyes BID   Continuous Infusions: . sodium chloride    . famotidine (PEPCID) IV 20 mg (03/29/17 2210)     LOS: 1 day        Aline August, MD [facility] Pager [phone]  If 7PM-7AM, please contact night-coverage [email or web address] Password [email or web address] 03/30/2017, 2:08 PM

## 2017-03-30 NOTE — Op Note (Signed)
Preoperative diagnosis:choledocholithiasis s/p ercp Postoperative diagnosis: saa Procedure: Laparoscopic cholecystectomy Surgeon: Dr. Serita Grammes Asst: Dr Alphonsa Overall Anesthesia: Gen. Specimens: gb to pathology Estimated blood loss: minimal Complications: None Drains: none Sponge count was correct at completion Disposition to recovery stable  Indications: This is a 82 yof who has history of biliary colic and recent choledocholithiasis for which she was admitted.  She has undergone ercp. We discussed laparoscopic cholecystectomy and risks associated with that.  Dr Lucia Gaskins assisted me with this chronically inflamed gallbladder and due to liver was necessary for exposure.  Procedure: After informed consent was obtained the patient was taken to the operating room. She was given antibiotics. SCDs were in place. She was placed undergeneral anesthesia without complication. Her abdomen was prepped and draped in the standard sterile surgical fashion. A surgical timeout was then performed.  I then infiltrated Marcaine below the umbilicus.  I made a vertical incision. I identified the fascia incised sharply. I entered into the peritoneum bluntly. There is no evidence of an entry injury. I then placed a 0 Vicryl pursestring suture. I then inserted a Hassan trocar and insufflated to 15 mmHg pressure. I then placed a 5 mm trocar and epigastrium.Two 5 mm trocars were placed in the right side of the abdomen. I grasped the gallbladder and retracted cephalad. The liver was noted to be nodular but typical of cirrhosis. It also appeared fatty.   I was able to identify the critical view of safety. I identified the cystic artery. I divided this with two clips remaining in place. I proceeded to address the cystic duct. I placed three clips and divided this. The cystic duct was viable and the clips completely traversed the duct. Two clips were left in place. I then removed the gallbladder from the liver bed.   I placed it in a bag and removed from the umbilicus. I obtained hemostasis. I placed apiece of Surgicel in the liver bed overlying this. I then removed the [facility] trocar and tied the pursestring down. I did place an additional 2-0 Vicryl suture at this area using the endoclose device. I then removed the remaining trocars and these were closed with 4-0 Monocryl and glue. She tolerated this well be transferred recovery room.

## 2017-03-30 NOTE — Progress Notes (Signed)
Pt's upper lip is more swollen today. The area around her lupper lip on the front side is more edematous. MD saw it this morning. Will continue to monitor.

## 2017-03-30 NOTE — Progress Notes (Signed)
1 Day Post-Op   Subjective/Chief Complaint: Feels fine, ercp yesterday, no complaints   Objective: Vital signs in last 24 hours: Temp:  [97.9 F (36.6 C)-98.7 F (37.1 C)] 98.3 F (36.8 C) (05/28 0445) Pulse Rate:  [72-98] 78 (05/28 0445) Resp:  [15-17] 16 (05/28 0445) BP: (103-121)/(51-75) 120/67 (05/28 0445) SpO2:  [97 %-100 %] 97 % (05/28 0445) Last BM Date: 03/28/17  Intake/Output from previous day: 05/27 0701 - 05/28 0700 In: 978.3 [P.O.:360; I.V.:518.3; IV Piggyback:100] Out: 2250 [Urine:2250] Intake/Output this shift: Total I/O In: 410 [P.O.:360; IV Piggyback:50] Out: 1050 [Urine:1050]  GI: soft nt/nd  Lab Results:   Recent Labs  03/29/17 0442 03/30/17 0432  WBC 2.8* 4.1  HGB 10.3* 12.0  HCT 30.6* 35.4*  PLT 183 199   BMET  Recent Labs  03/29/17 0442 03/30/17 0432  NA 141 140  K 3.8 4.1  CL 113* 109  CO2 21* 24  GLUCOSE 87 81  BUN 8 7  CREATININE 0.60 0.76  CALCIUM 8.3* 8.8*   PT/INR  Recent Labs  03/29/17 0442  LABPROT 18.7*  INR 1.54   ABG No results for input(s): PHART, HCO3 in the last 72 hours.  Invalid input(s): PCO2, PO2  Studies/Results: Dg Chest 2 View  Result Date: 03/28/2017 CLINICAL DATA:  Chest pain EXAM: CHEST  2 VIEW COMPARISON:  03/25/2017 FINDINGS: Cardiac shadow is stable. The lungs are well aerated bilaterally. No focal infiltrate or sizable effusion is seen. Mild degenerative changes of the thoracic spine are seen. IMPRESSION: No acute abnormality noted. Electronically Signed   By: Inez Catalina M.D.   On: 03/28/2017 20:12   US Abdomen Limited Ruq  Result Date: 03/29/2017 CLINICAL DATA:  Chest pain.  Known gallstones. EXAM: US ABDOMEN LIMITED - RIGHT UPPER QUADRANT COMPARISON:  03/26/2017 FINDINGS: Gallbladder: As seen on the previous study, there is cholelithiasis with sludge in the gallbladder. Gallbladder wall today is demonstrating mild focal thickening at 4.2 mm. This represents progression since the previous  study. Murphy's sign remains negative. Common bile duct: Diameter: 7.2 mm, mildly dilated. An echogenic structure consistent with a 5 mm stone is demonstrated in the distal common bile duct. This is new since previous study. Liver: No focal lesion identified. Within normal limits in parenchymal echogenicity. IMPRESSION: Persistent cholelithiasis with gallbladder sludge. Gallbladder wall thickness is developing since previous study. New development of mild bile duct dilatation with a 5 mm stone now seen in the distal common bile duct. Electronically Signed   By: Lucienne Capers M.D.   On: 03/29/2017 00:23    Anti-infectives: Anti-infectives    Start     Dose/Rate Route Frequency Ordered Stop   03/29/17 1200  cefTRIAXone (ROCEPHIN) 1 g in dextrose 5 % 50 mL IVPB     1 g 100 mL/hr over 30 Minutes Intravenous Every 24 hours 03/29/17 1107        Assessment/Plan: Choledocholithiasis  Ercp yesterday, doing well Proceed with lap chole as discussed today  [facility] 03/30/2017

## 2017-03-30 NOTE — Anesthesia Postprocedure Evaluation (Signed)
Anesthesia Post Note  Patient: Diane [name]  Procedure(s) Performed: Procedure(s) (LRB): LAPAROSCOPIC CHOLECYSTECTOMY (N/A)  Patient location during evaluation: PACU Anesthesia Type: General Level of consciousness: awake and alert Pain management: pain level controlled Vital Signs Assessment: post-procedure vital signs reviewed and stable Respiratory status: spontaneous breathing, nonlabored ventilation, respiratory function stable and patient connected to nasal cannula oxygen Cardiovascular status: blood pressure returned to baseline and stable Postop Assessment: no signs of nausea or vomiting Anesthetic complications: no       Last Vitals:  Vitals:   03/30/17 1145 03/30/17 1158  BP: 117/62 109/72  Pulse: 64 78  Resp: (!) 8 12  Temp: 36.8 C 36.9 C    Last Pain:  Vitals:   03/30/17 1209  TempSrc:   PainSc: 2                  Evelynn Hench,W. EDMOND

## 2017-03-30 NOTE — Anesthesia Procedure Notes (Addendum)
Procedure Name: Intubation Date/Time: 03/30/2017 9:43 AM Performed by: Montel Clock Pre-anesthesia Checklist: Patient identified, Emergency Drugs available, Suction available, Patient being monitored and Timeout performed Patient Re-evaluated:Patient Re-evaluated prior to inductionOxygen Delivery Method: Circle system utilized Preoxygenation: Pre-oxygenation with 100% oxygen Intubation Type: IV induction Laryngoscope Size: 3 and Glidescope Grade View: Grade II Tube type: Parker flex tip Tube size: 7.0 mm Number of attempts: 1 Airway Equipment and Method: Rigid stylet Placement Confirmation: ETT inserted through vocal cords under direct vision,  positive ETCO2 and breath sounds checked- equal and bilateral Secured at: 21 cm Tube secured with: Tape Dental Injury: Teeth and Oropharynx as per pre-operative assessment  Comments: Elective glidescope r/t to prior difficulty. Upper lip swollen prior to procedure r/t ERCP 03/29/17.

## 2017-03-30 NOTE — Discharge Instructions (Signed)
[facility] LAPAROSCOPIC SURGERY: POST OP INSTRUCTIONS  Always review your discharge instruction sheet given to you by the facility where your surgery was performed. IF YOU HAVE DISABILITY OR FAMILY LEAVE FORMS, YOU MUST BRING THEM TO THE OFFICE FOR PROCESSING.   DO NOT GIVE THEM TO YOUR DOCTOR.  1. A prescription for pain medication may be given to you upon discharge.  Take your pain medication as prescribed, if needed.  If narcotic pain medicine is not needed, then you may take acetaminophen (Tylenol), naprosyn (Alleve), or ibuprofen (Advil) as needed. 2. Take your usually prescribed medications unless otherwise directed. 3. If you need a refill on your pain medication, please contact your pharmacy.  They will contact our office to request authorization. Prescriptions will not be filled after 5pm or on week-ends. 4. You should follow a light diet the first few days after arrival home, such as soup and crackers, etc.  Be sure to include lots of fluids daily. 5. Most patients will experience some swelling and bruising in the area of the incisions.  Ice packs will help.  Swelling and bruising can take several days to resolve.  6. It is common to experience some constipation if taking pain medication after surgery.  Increasing fluid intake and taking a stool softener (such as Colace) will usually help or prevent this problem from occurring.  A mild laxative (Milk of Magnesia or Miralax) should be taken according to package instructions if there are no bowel movements after 48 hours. 7. Unless discharge instructions indicate otherwise, you may remove your bandages 48 hours after surgery, and you may shower at that time.  You may have steri-strips (small skin tapes) in place directly over the incision.  These strips should be left on the skin for 7-10 days.  If your surgeon used skin glue on the incision, you may shower in 24 hours.  The glue will flake  off over the next 2-3 weeks.  Any sutures or staples will be removed at the office during your follow-up visit. 8. ACTIVITIES:  You may resume regular (light) daily activities beginning the next day--such as daily self-care, walking, climbing stairs--gradually increasing activities as tolerated.  You may have sexual intercourse when it is comfortable.  Refrain from any heavy lifting or straining until approved by your doctor. a. You may drive when you are no longer taking prescription pain medication, you can comfortably wear a seatbelt, and you can safely maneuver your car and apply brakes. b. RETURN TO WORK:  __________________________________________________________ 9. You should see your doctor in the office for a follow-up appointment approximately 2-3 weeks after your surgery.  Make sure that you call for this appointment within a day or two after you arrive home to insure a convenient appointment time. 10. OTHER INSTRUCTIONS: __________________________________________________________________________________________________________________________ __________________________________________________________________________________________________________________________ WHEN TO CALL YOUR DOCTOR: 1. Fever over 101.0 2. Inability to urinate 3. Continued bleeding from incision. 4. Increased pain, redness, or drainage from the incision. 5. Increasing abdominal pain  The clinic staff is available to answer your questions during regular business hours.  Please don't hesitate to call and ask to speak to one of the nurses for clinical concerns.  If you have a medical emergency, go to the nearest emergency room or call 911.  A surgeon from [facility] is always on call at the hospital. [address], [address], [place], [place]  [address] ? [address] [address], [place], [place]   [address] [phone] ? [phone] ? [phone] Web site: [email or web address]  

## 2017-03-30 NOTE — Progress Notes (Signed)
Pt's upper lip is blistered on the underside. Lip is also swollen. Pt using Carmex to keep it moist and soothe it. Will continue to monitor.

## 2017-03-30 NOTE — Progress Notes (Signed)
[facility] Gastroenterology Progress Note  Diane [name] 66 y.o. November 04, 1950  CC:  CBD stone   Subjective: Patient is feeling better. She is status post cholecystectomy. Her epigastric pain is resolved but now complaining of right flank pain. Tolerating diet.  ROS : Negative for chest pain and shortness of breath   Objective: Vital signs in last 24 hours: Vitals:   03/30/17 1349 03/30/17 1455  BP: (!) 108/56 103/82  Pulse: 81 85  Resp: 16 16  Temp: 97.9 F (36.6 C) 98.9 F (37.2 C)    Physical Exam:  General:  Alert, cooperative, no distress, appears stated age  Head:  Normocephalic, without obvious abnormality, atraumatic  Eyes:  , EOM's intact,   Lungs:   Clear to auscultation bilaterally, respirations unlabored  Heart:  Regular rate and rhythm, S1, S2 normal  Abdomen:   Right upper quadrant tenderness to palpation, soft, nondistended, bowel sounds present. No peritoneal signs.   Extremities: Extremities normal, atraumatic, no  edema  Pulses: 2+ and symmetric    Lab Results:  Recent Labs  03/29/17 0442 03/30/17 0432  NA 141 140  K 3.8 4.1  CL 113* 109  CO2 21* 24  GLUCOSE 87 81  BUN 8 7  CREATININE 0.60 0.76  CALCIUM 8.3* 8.8*  MG  --  1.9    Recent Labs  03/29/17 0442 03/30/17 0432  AST 84* 62*  ALT 81* 77*  ALKPHOS 111 128*  BILITOT 1.0 1.1  PROT 5.9* 7.3  ALBUMIN 2.9* 3.5    Recent Labs  03/29/17 0442 03/30/17 0432  WBC 2.8* 4.1  NEUTROABS 1.5* 2.1  HGB 10.3* 12.0  HCT 30.6* 35.4*  MCV 104.8* 106.0*  PLT 183 199    Recent Labs  03/29/17 0442  LABPROT 18.7*  INR 1.54      Assessment/Plan: - Choledocholithiasis based on ultrasound. Status post ERCP with sphincterotomy yesterday. No filling defects were identified. Balloon sweep revealed no stones . She is now status post cholecystectomy this morning. - Nodular liver based on op note. CT abdomen in April 11 showed steatosis. - H/o Crohn's  disease  Recommendations ------------------------ - Patient is feeling better. Her epigastric pain is resolved but she is having mild right upper quadrant/right flank pain. - There is mention of nodular liver in the op note by Dr. Donne Hazel . CT scan in April showed mild hepatic steatosis. Recommend outpatient monitoring of LFTs. - Advance diet per surgical protocol. GI will sign off. Call us back if needed. -  Follow-up in GI clinic in 2-3 weeks after discharge  Otis Brace MD, [place] 03/30/2017, 3:37 PM  Pager [phone]  If no answer or after 5 PM call [phone]

## 2017-03-30 NOTE — Transfer of Care (Signed)
Immediate Anesthesia Transfer of Care Note  Patient: Diane [name]  Procedure(s) Performed: Procedure(s): LAPAROSCOPIC CHOLECYSTECTOMY (N/A)  Patient Location: PACU  Anesthesia Type:General  Level of Consciousness:  sedated, patient cooperative and responds to stimulation  Airway & Oxygen Therapy:Patient Spontanous Breathing and Patient connected to face mask oxgen  Post-op Assessment:  Report given to PACU RN and Post -op Vital signs reviewed and stable  Post vital signs:  Reviewed and stable  Last Vitals:  Vitals:   03/30/17 0445 03/30/17 1050  BP: 120/67 129/72  Pulse: 78 85  Resp: 16 (!) (P) 8  Temp: 36.8 C (P) 06.3 C    Complications: No apparent anesthesia complications

## 2017-03-31 ENCOUNTER — Encounter (HOSPITAL_COMMUNITY): Payer: Self-pay | Admitting: General Surgery

## 2017-03-31 LAB — COMPREHENSIVE METABOLIC PANEL
ALBUMIN: 2.9 g/dL — AB (ref 3.5–5.0)
ALT: 65 U/L — ABNORMAL HIGH (ref 14–54)
ANION GAP: 4 — AB (ref 5–15)
AST: 55 U/L — AB (ref 15–41)
Alkaline Phosphatase: 97 U/L (ref 38–126)
CHLORIDE: 109 mmol/L (ref 101–111)
CO2: 27 mmol/L (ref 22–32)
Calcium: 8.4 mg/dL — ABNORMAL LOW (ref 8.9–10.3)
Creatinine, Ser: 0.68 mg/dL (ref 0.44–1.00)
GFR calc Af Amer: >60 mL/min (ref >60)
GFR calc non Af Amer: >60 mL/min (ref >60)
GLUCOSE: 94 mg/dL (ref 65–99)
Potassium: 3.4 mmol/L — ABNORMAL LOW (ref 3.5–5.1)
Sodium: 140 mmol/L (ref 135–145)
Total Bilirubin: 0.6 mg/dL (ref 0.3–1.2)
Total Protein: 5.9 g/dL — ABNORMAL LOW (ref 6.5–8.1)

## 2017-03-31 LAB — CBC WITH DIFFERENTIAL/PLATELET
BASOS ABS: 0 10*3/uL (ref 0.0–0.1)
BASOS PCT: 0 %
EOS PCT: 1 %
Eosinophils Absolute: 0 10*3/uL (ref 0.0–0.7)
HCT: 30.5 % — ABNORMAL LOW (ref 36.0–46.0)
Hemoglobin: 10.2 g/dL — ABNORMAL LOW (ref 12.0–15.0)
Lymphocytes Relative: 30 %
Lymphs Abs: 1.1 10*3/uL (ref 0.7–4.0)
MCH: 34.9 pg — ABNORMAL HIGH (ref 26.0–34.0)
MCHC: 33.4 g/dL (ref 30.0–36.0)
MCV: 104.5 fL — ABNORMAL HIGH (ref 78.0–100.0)
MONO ABS: 0.4 10*3/uL (ref 0.1–1.0)
Monocytes Relative: 11 %
Neutro Abs: 2 10*3/uL (ref 1.7–7.7)
Neutrophils Relative %: 58 %
Platelets: 177 10*3/uL (ref 150–400)
RBC: 2.92 MIL/uL — ABNORMAL LOW (ref 3.87–5.11)
RDW: 14.9 % (ref 11.5–15.5)
WBC: 3.5 10*3/uL — ABNORMAL LOW (ref 4.0–10.5)

## 2017-03-31 MED ORDER — TRAMADOL HCL 50 MG PO TABS: 50.0000 mg | ORAL_TABLET | Freq: Four times a day (QID) | ORAL | 0 refills | Status: DC | PRN

## 2017-03-31 MED ORDER — ONDANSETRON HCL 4 MG PO TABS: 4.0000 mg | ORAL_TABLET | Freq: Four times a day (QID) | ORAL | 0 refills | Status: DC | PRN

## 2017-03-31 MED ORDER — POTASSIUM CHLORIDE CRYS ER 20 MEQ PO TBCR
40.0000 meq | EXTENDED_RELEASE_TABLET | Freq: Once | ORAL | Status: AC
  Administered 2017-03-31: 40 meq via ORAL
  Filled 2017-03-31: qty 2

## 2017-03-31 NOTE — Discharge Summary (Signed)
Physician Discharge Summary  Diane [name] TKP:[id number] DOB: April 30, 1951 DOA: 03/28/2017  PCP: Antony Blackbird, MD  Admit date: 03/28/2017 Discharge date: 03/31/2017  Admitted From: Home Disposition:  Home  Recommendations for Outpatient Follow-up:  1. Follow up with PCP in 1-2 weeks 2. Please obtain CMP/CBC in one week 3. Follow-up with general surgery/Dr. Donne Hazel in 1-2 weeks' time.  Home Health: No Equipment/Devices: None  Discharge Condition: Stable CODE STATUS: Full Diet recommendation: Heart Healthy  Brief/Interim Summary: 66 y.o.femalewith medical history significant for Crohn's disease and history of SBO that resolved with conservative management, presented with recurrent, severe postprandial epigastric pain with nausea and one episode of vomiting. She was found to have cholelithiasis with CBD dilation and distal CBD stone along with gallbladder wall thickening. GI and general surgery was consulted. Patient had ERCP with sphincterotomy done on 03/29/2017 and cholangiogram revealed no filling defects. She had laparoscopic cholecystectomy done on 03/30/2017. Postoperatively, she has done well. Surgery has cleared her for discharge. Discharge Diagnoses:  Principal Problem:   Choledocholithiasis with obstruction Active Problems:   Crohn's disease ([place])  1. Choledocholithiasis with mild acute transaminitis - Status post ERCP with  sphincterotomy done on 03/29/2017 and cholangiogram revealed no filling defects. Patient might have already passed the stone - Repeat LFTs are improving  2. Cholelithiasis with gallbladder thickening - Status post laparoscopic cholecystectomy by surgery on 03/30/2017. -Doing well postoperatively. Tolerating diet - Surgery has cleared the patient for discharge. Discharge home today. Outpatient follow-up with general surgery in 1-2 weeks' time. Activity and wound care as per Gen. surgery recommendations  3. Crohn's disease  - Pt reports this to be  stable  - She had been on weekly Humira until suffering zoster a few months ago, at which point it was stopped  - She has continued with mercaptopurine and reports that her gastroenterologist was very pleased with her last CT in April 2018 - Continue mercaptopurine. Outpatient follow-up with GI  4. Hypokalemia Replace prior to discharge  Discharge Instructions  Discharge Instructions    Call MD for:  difficulty breathing, headache or visual disturbances    Complete by:  As directed    Call MD for:  extreme fatigue    Complete by:  As directed    Call MD for:  persistant dizziness or light-headedness    Complete by:  As directed    Call MD for:  persistant nausea and vomiting    Complete by:  As directed    Call MD for:  severe uncontrolled pain    Complete by:  As directed    Call MD for:  temperature >100.4    Complete by:  As directed    Diet - low sodium heart healthy    Complete by:  As directed    Discharge instructions    Complete by:  As directed    Activity and wound care as per General surgery recommendations   Increase activity slowly    Complete by:  As directed      Allergies as of 03/31/2017      Reactions   Celebrex [celecoxib] Anaphylaxis   Prednisone Other (See Comments)   Severe psychosis requiring hospitalization.  Avoid use if possible.     Actonel [risedronate Sodium] Other (See Comments)   GI UPSET   Albuterol Other (See Comments)   sobbing   Ammonium-containing Compounds    Boniva [ibandronic Acid] Other (See Comments)   GI UPSET   Chantix [varenicline] Other (See Comments)   MOOD CHANGE  Fosamax [alendronate Sodium] Other (See Comments)   GI UPSET   Imuran [azathioprine] Diarrhea, Nausea And Vomiting   Macrodantin [nitrofurantoin Macrocrystal]    Neosporin [neomycin-bacitracin Zn-polymyx] Swelling, Other (See Comments)   REDNESS   Nitrofuran Derivatives Other (See Comments)   CHILLS AND FEVER   Other    Pt states allergic to 3 more  medicines and doesn't know name of them.   Penicillins Diarrhea, Nausea And Vomiting, Other (See Comments)   Prolia [denosumab] Other (See Comments)   Irritates chrons   Tobradex [tobramycin-dexamethasone] Other (See Comments)   SEVERE EYE IRRIATION   Vagifem [estradiol] Other (See Comments)   bleeding      Medication List    TAKE these medications   acetaminophen 500 MG tablet Commonly known as:  TYLENOL Take 500 mg by mouth every 6 (six) hours as needed for mild pain.   CALCIUM + D3 PO Take 2 tablets by mouth daily.   folic acid 1 MG tablet Commonly known as:  FOLVITE Take 1 mg by mouth daily.   mercaptopurine 50 MG tablet Commonly known as:  PURINETHOL Take 75 mg by mouth daily. Give on an empty stomach 1 hour before or 2 hours after meals. Caution: Chemotherapy.   ondansetron 4 MG tablet Commonly known as:  ZOFRAN Take 1 tablet (4 mg total) by mouth every 6 (six) hours as needed for nausea.   PROBIOTIC DAILY PO Take 1 tablet by mouth daily. Align   SYSTANE OP Place 2 drops into both eyes 2 (two) times daily.   traMADol 50 MG tablet Commonly known as:  ULTRAM Take 1 tablet (50 mg total) by mouth every 6 (six) hours as needed.      Follow-up Information    Fulp, Cammie, MD Follow up in 1 week(s).   Specialty:  Family Medicine Contact information: [address] [address] [phone]        Rolm Bookbinder, MD Follow up in 1 week(s).   Specialty:  General Surgery Contact information: [address] [place] [address] [phone]          Allergies  Allergen Reactions  . Celebrex [Celecoxib] Anaphylaxis  . Prednisone Other (See Comments)    Severe psychosis requiring hospitalization.  Avoid use if possible.    Edd Fabian [Risedronate Sodium] Other (See Comments)    GI UPSET  . Albuterol Other (See Comments)    sobbing  . Ammonium-Containing Compounds   . Boniva [Ibandronic Acid] Other (See Comments)    GI  UPSET  . Chantix [Varenicline] Other (See Comments)    MOOD CHANGE  . Fosamax [Alendronate Sodium] Other (See Comments)    GI UPSET  . Imuran [Azathioprine] Diarrhea and Nausea And Vomiting  . Macrodantin [Nitrofurantoin Macrocrystal]   . Neosporin [Neomycin-Bacitracin Zn-Polymyx] Swelling and Other (See Comments)    REDNESS  . Nitrofuran Derivatives Other (See Comments)    CHILLS AND FEVER  . Other     Pt states allergic to 3 more medicines and doesn't know name of them.  . Penicillins Diarrhea, Nausea And Vomiting and Other (See Comments)  . Prolia [Denosumab] Other (See Comments)    Irritates chrons  . Tobradex [Tobramycin-Dexamethasone] Other (See Comments)    SEVERE EYE IRRIATION  . Vagifem [Estradiol] Other (See Comments)    bleeding    Consultations:  Gastroenterology and general surgery   Procedures/Studies: Dg Chest 2 View  Result Date: 03/28/2017 CLINICAL DATA:  Chest pain EXAM: CHEST  2 VIEW  COMPARISON:  03/25/2017 FINDINGS: Cardiac shadow is stable. The lungs are well aerated bilaterally. No focal infiltrate or sizable effusion is seen. Mild degenerative changes of the thoracic spine are seen. IMPRESSION: No acute abnormality noted. Electronically Signed   By: Inez Catalina M.D.   On: 03/28/2017 20:12   Dg Chest 2 View  Result Date: 03/25/2017 CLINICAL DATA:  Chest pain and pressure.  Nausea. EXAM: CHEST  2 VIEW COMPARISON:  09/03/2014 FINDINGS: Mild hyperinflation likely indicates emphysema. Normal heart size and pulmonary vascularity. No focal airspace disease or consolidation in the lungs. No blunting of costophrenic angles. No pneumothorax. Mediastinal contours appear intact. IMPRESSION: Mild emphysematous changes in the lungs. No evidence of active pulmonary disease. Electronically Signed   By: Lucienne Capers M.D.   On: 03/25/2017 23:33   Dg Ercp Biliary & Pancreatic Ducts  Result Date: 03/30/2017 CLINICAL DATA:  66 year old female with a history of common bile  duct stone EXAM: ERCP TECHNIQUE: Multiple spot images obtained with the fluoroscopic device and submitted for interpretation post-procedure. FLUOROSCOPY TIME:  Fluoroscopy Time:  1 minutes 38 seconds COMPARISON:  None FINDINGS: Limited intraoperative fluoroscopic spot images during ERCP. Initial image demonstrates the endoscope projecting over the upper abdomen with cannulation of the ampulla. Subsequent images demonstrate partial opacification of the extrahepatic biliary ducts. Rounded vague filling defect at the proximal common bile duct present on single image, absent on final image. Evidence of a balloon trawl deployment. IMPRESSION: Limited images during ERCP demonstrates a vague filling defect at the proximal common bile duct, potentially retained stones/debris or alternatively air bubbles. This filling defect not present on the final image after treatment. Please refer to the dictated operative report for full details of intraoperative findings and procedure. Electronically Signed   By: Corrie Mckusick D.O.   On: 03/30/2017 08:11   US Abdomen Limited Ruq  Result Date: 03/29/2017 CLINICAL DATA:  Chest pain.  Known gallstones. EXAM: US ABDOMEN LIMITED - RIGHT UPPER QUADRANT COMPARISON:  03/26/2017 FINDINGS: Gallbladder: As seen on the previous study, there is cholelithiasis with sludge in the gallbladder. Gallbladder wall today is demonstrating mild focal thickening at 4.2 mm. This represents progression since the previous study. Murphy's sign remains negative. Common bile duct: Diameter: 7.2 mm, mildly dilated. An echogenic structure consistent with a 5 mm stone is demonstrated in the distal common bile duct. This is new since previous study. Liver: No focal lesion identified. Within normal limits in parenchymal echogenicity. IMPRESSION: Persistent cholelithiasis with gallbladder sludge. Gallbladder wall thickness is developing since previous study. New development of mild bile duct dilatation with a 5 mm  stone now seen in the distal common bile duct. Electronically Signed   By: Lucienne Capers M.D.   On: 03/29/2017 00:23   US Abdomen Limited Ruq  Result Date: 03/26/2017 CLINICAL DATA:  Upper abdominal pain.  History of gallstones. EXAM: US ABDOMEN LIMITED - RIGHT UPPER QUADRANT COMPARISON:  CT abdomen and pelvis 02/27/2017 FINDINGS: Gallbladder: Several stones demonstrated in the gallbladder, largest measuring about 1 cm diameter. Sludge also in the gallbladder. No gallbladder wall thickening or edema. Murphy's sign is negative. Common bile duct: Diameter: 4.4 mm, normal Liver: No focal lesion identified. Within normal limits in parenchymal echogenicity. IMPRESSION: Cholelithiasis and gallbladder sludge. No additional changes to suggest acute cholecystitis. Electronically Signed   By: Lucienne Capers M.D.   On: 03/26/2017 00:47     ERCP with  sphincterotomy done on 03/29/2017; cholangiogram revealed no filling defects Laparoscopic cholecystectomy on 03/30/2017   Subjective:  Patient seen and examined at bedside. She feels much better no current abdominal pain, nausea, vomiting, fever.  Discharge Exam: Vitals:   03/31/17 0145 03/31/17 0630  BP: (!) 111/57 123/70  Pulse: 74 75  Resp: 16 16  Temp: 99 F (37.2 C) 98.6 F (37 C)   Vitals:   03/30/17 1759 03/30/17 2148 03/31/17 0145 03/31/17 0630  BP: (!) 109/54 (!) 113/53 (!) 111/57 123/70  Pulse: 76 75 74 75  Resp: 16 16 16 16   Temp: 98.9 F (37.2 C) 99.9 F (37.7 C) 99 F (37.2 C) 98.6 F (37 C)  TempSrc: Oral Oral Oral Oral  SpO2: 96% 95% 95% 95%  Weight:      Height:        General: Pt is alert, awake, not in acute distress Cardiovascular: RRR, S1/S2 +,  Respiratory:Bilateral decreased breath sounds at bases  Abdominal: Soft, NT, ND, bowel sounds + Extremities: no edema, no cyanosis    The results of significant diagnostics from this hospitalization (including imaging, microbiology, ancillary and laboratory) are  listed below for reference.     Microbiology: Recent Results (from the past 240 hour(s))  Surgical PCR screen     Status: None   Collection Time: 03/30/17  4:21 AM  Result Value Ref Range Status   MRSA, PCR NEGATIVE NEGATIVE Final   Staphylococcus aureus NEGATIVE NEGATIVE Final    Comment:        The Xpert SA Assay (FDA approved for NASAL specimens in patients over 77 years of age), is one component of a comprehensive surveillance program.  Test performance has been validated by [facility] for patients greater than or equal to 62 year old. It is not intended to diagnose infection nor to guide or monitor treatment.      Labs: BNP (last 3 results) No results for input(s): BNP in the last 8760 hours. Basic Metabolic Panel:  Recent Labs Lab 03/25/17 2311 03/28/17 2017 03/29/17 0442 03/30/17 0432 03/31/17 0433  NA 137 137 141 140 140  K 3.5 3.9 3.8 4.1 3.4*  CL 103 105 113* 109 109  CO2 27 23 21* 24 27  GLUCOSE 113* 100* 87 81 94  BUN 14 12 8 7  <5*  CREATININE 0.90 0.76 0.60 0.76 0.68  CALCIUM 9.1 9.4 8.3* 8.8* 8.4*  MG  --   --   --  1.9  --    Liver Function Tests:  Recent Labs Lab 03/25/17 2311 03/28/17 2100 03/29/17 0442 03/30/17 0432 03/31/17 0433  AST 45* 152* 84* 62* 55*  ALT 51 100* 81* 77* 65*  ALKPHOS 79 140* 111 128* 97  BILITOT 0.8 1.7* 1.0 1.1 0.6  PROT 7.4 7.6 5.9* 7.3 5.9*  ALBUMIN 3.7 3.9 2.9* 3.5 2.9*    Recent Labs Lab 03/25/17 2311 03/28/17 2017  LIPASE 35 42   No results for input(s): AMMONIA in the last 168 hours. CBC:  Recent Labs Lab 03/25/17 2311 03/28/17 2017 03/29/17 0442 03/30/17 0432 03/31/17 0433  WBC 3.8* 4.9 2.8* 4.1 3.5*  NEUTROABS  --   --  1.5* 2.1 2.0  HGB 12.3 12.6 10.3* 12.0 10.2*  HCT 35.4* 36.8 30.6* 35.4* 30.5*  MCV 104.1* 104.5* 104.8* 106.0* 104.5*  PLT 244 223 183 199 177   Cardiac Enzymes: No results for input(s): CKTOTAL, CKMB, CKMBINDEX, TROPONINI in the last 168 hours. BNP: Invalid  input(s): POCBNP CBG: No results for input(s): GLUCAP in the last 168 hours. D-Dimer No results for input(s): DDIMER in the last  72 hours. Hgb A1c No results for input(s): HGBA1C in the last 72 hours. Lipid Profile No results for input(s): CHOL, HDL, LDLCALC, TRIG, CHOLHDL, LDLDIRECT in the last 72 hours. Thyroid function studies No results for input(s): TSH, T4TOTAL, T3FREE, THYROIDAB in the last 72 hours.  Invalid input(s): FREET3 Anemia work up No results for input(s): VITAMINB12, FOLATE, FERRITIN, TIBC, IRON, RETICCTPCT in the last 72 hours. Urinalysis    Component Value Date/Time   COLORURINE YELLOW 09/06/2014 1805   APPEARANCEUR CLEAR 09/06/2014 1805   LABSPEC 1.008 09/06/2014 1805   PHURINE 6.0 09/06/2014 1805   GLUCOSEU NEGATIVE 09/06/2014 1805   HGBUR NEGATIVE 09/06/2014 1805   BILIRUBINUR NEGATIVE 09/06/2014 1805   KETONESUR 40 (A) 09/06/2014 1805   PROTEINUR NEGATIVE 09/06/2014 1805   UROBILINOGEN 0.2 09/06/2014 1805   NITRITE NEGATIVE 09/06/2014 1805   LEUKOCYTESUR NEGATIVE 09/06/2014 1805   Sepsis Labs Invalid input(s): PROCALCITONIN,  WBC,  LACTICIDVEN Microbiology Recent Results (from the past 240 hour(s))  Surgical PCR screen     Status: None   Collection Time: 03/30/17  4:21 AM  Result Value Ref Range Status   MRSA, PCR NEGATIVE NEGATIVE Final   Staphylococcus aureus NEGATIVE NEGATIVE Final    Comment:        The Xpert SA Assay (FDA approved for NASAL specimens in patients over 57 years of age), is one component of a comprehensive surveillance program.  Test performance has been validated by [facility] for patients greater than or equal to 73 year old. It is not intended to diagnose infection nor to guide or monitor treatment.      Time coordinating discharge: 35 minutes  SIGNED:   Aline August, MD  [facility] 03/31/2017, 9:05 AM Pager: [phone]  If 7PM-7AM, please contact night-coverage [email or web address] Password  [email or web address]

## 2017-03-31 NOTE — Progress Notes (Signed)
[facility] Progress Note  1 Day Post-Op  Subjective: CC: choledocholithiasis  Pain controlled, tolerating PO, mobilizing, having flatus, urinating without hesitancy.   Objective: Vital signs in last 24 hours: Temp:  [97.5 F (36.4 C)-99.9 F (37.7 C)] 98.6 F (37 C) (05/29 0630) Pulse Rate:  [64-85] 75 (05/29 0630) Resp:  [6-16] 16 (05/29 0630) BP: (92-129)/(52-82) 123/70 (05/29 0630) SpO2:  [94 %-100 %] 95 % (05/29 0630) Last BM Date: 03/28/17  Intake/Output from previous day: 05/28 0701 - 05/29 0700 In: 2564.2 [P.O.:600; I.V.:1914.2; IV Piggyback:50] Out: 4098 [Urine:3100; Blood:5] Intake/Output this shift: No intake/output data recorded.  PE: Gen:  Alert, NAD, pleasant Pulm:  Normal effort Abd: Soft, appropriately tender, incisions c/d/i Skin: warm and dry, no rashes  Psych: A&Ox3   Lab Results:   Recent Labs  03/30/17 0432 03/31/17 0433  WBC 4.1 3.5*  HGB 12.0 10.2*  HCT 35.4* 30.5*  PLT 199 177   BMET  Recent Labs  03/30/17 0432 03/31/17 0433  NA 140 140  K 4.1 3.4*  CL 109 109  CO2 24 27  GLUCOSE 81 94  BUN 7 <5*  CREATININE 0.76 0.68  CALCIUM 8.8* 8.4*   PT/INR  Recent Labs  03/29/17 0442  LABPROT 18.7*  INR 1.54   CMP     Component Value Date/Time   NA 140 03/31/2017 0433   K 3.4 (L) 03/31/2017 0433   CL 109 03/31/2017 0433   CO2 27 03/31/2017 0433   GLUCOSE 94 03/31/2017 0433   BUN <5 (L) 03/31/2017 0433   CREATININE 0.68 03/31/2017 0433   CALCIUM 8.4 (L) 03/31/2017 0433   PROT 5.9 (L) 03/31/2017 0433   ALBUMIN 2.9 (L) 03/31/2017 0433   AST 55 (H) 03/31/2017 0433   ALT 65 (H) 03/31/2017 0433   ALKPHOS 97 03/31/2017 0433   BILITOT 0.6 03/31/2017 0433   GFRNONAA >60 03/31/2017 0433   GFRAA >60 03/31/2017 0433   Lipase     Component Value Date/Time   LIPASE 42 03/28/2017 2017       Studies/Results: Dg Ercp Biliary & Pancreatic Ducts  Result Date: 03/30/2017 CLINICAL DATA:  66 year old female with a  history of common bile duct stone EXAM: ERCP TECHNIQUE: Multiple spot images obtained with the fluoroscopic device and submitted for interpretation post-procedure. FLUOROSCOPY TIME:  Fluoroscopy Time:  1 minutes 38 seconds COMPARISON:  None FINDINGS: Limited intraoperative fluoroscopic spot images during ERCP. Initial image demonstrates the endoscope projecting over the upper abdomen with cannulation of the ampulla. Subsequent images demonstrate partial opacification of the extrahepatic biliary ducts. Rounded vague filling defect at the proximal common bile duct present on single image, absent on final image. Evidence of a balloon trawl deployment. IMPRESSION: Limited images during ERCP demonstrates a vague filling defect at the proximal common bile duct, potentially retained stones/debris or alternatively air bubbles. This filling defect not present on the final image after treatment. Please refer to the dictated operative report for full details of intraoperative findings and procedure. Electronically Signed   By: Corrie Mckusick D.O.   On: 03/30/2017 08:11    Anti-infectives: Anti-infectives    Start     Dose/Rate Route Frequency Ordered Stop   03/29/17 1200  cefTRIAXone (ROCEPHIN) 1 g in dextrose 5 % 50 mL IVPB  Status:  Discontinued     1 g 100 mL/hr over 30 Minutes Intravenous Every 24 hours 03/29/17 1107 03/30/17 1308       Assessment/Plan Choledocholithiasis POD#1 s/p laparoscopic cholecystectomy with IOC 03/31/17,  Dr. Rolm Bookbinder  - LFT's trending down, pain controlled, tolerating PO - stable for discharge from surgical standpoint - follow up at [place] office in 2-4 weeks    LOS: 2 days    Jill Alexanders , [facility] 03/31/2017, 9:12 AM Pager: [phone] Consults: [phone] Mon-Fri 7:00 am-4:30 pm Sat-Sun 7:00 am-11:30 am

## 2017-04-06 ENCOUNTER — Encounter (HOSPITAL_COMMUNITY): Payer: Self-pay | Admitting: General Surgery

## 2017-11-09 ENCOUNTER — Other Ambulatory Visit: Payer: Self-pay | Admitting: Internal Medicine

## 2017-11-09 DIAGNOSIS — Z1231 Encounter for screening mammogram for malignant neoplasm of breast: Secondary | ICD-10-CM

## 2018-01-13 ENCOUNTER — Ambulatory Visit
Admission: RE | Admit: 2018-01-13 | Discharge: 2018-01-13 | Disposition: A | Discharge status: Home / Self Care | Payer: Medicare Other | Source: Ambulatory Visit | Attending: Internal Medicine | Admitting: Internal Medicine

## 2018-01-13 DIAGNOSIS — Z1231 Encounter for screening mammogram for malignant neoplasm of breast: Secondary | ICD-10-CM

## 2019-02-21 ENCOUNTER — Other Ambulatory Visit: Payer: Self-pay | Admitting: Internal Medicine

## 2019-02-21 DIAGNOSIS — Z1231 Encounter for screening mammogram for malignant neoplasm of breast: Secondary | ICD-10-CM

## 2019-06-16 ENCOUNTER — Ambulatory Visit
Admission: RE | Admit: 2019-06-16 | Discharge: 2019-06-16 | Disposition: A | Discharge status: Home / Self Care | Payer: Medicare Other | Source: Ambulatory Visit | Attending: Internal Medicine | Admitting: Internal Medicine

## 2019-06-16 ENCOUNTER — Other Ambulatory Visit: Payer: Self-pay

## 2019-06-16 DIAGNOSIS — Z1231 Encounter for screening mammogram for malignant neoplasm of breast: Secondary | ICD-10-CM

## 2019-08-11 ENCOUNTER — Other Ambulatory Visit: Payer: Self-pay | Admitting: Gastroenterology

## 2019-08-11 DIAGNOSIS — R933 Abnormal findings on diagnostic imaging of other parts of digestive tract: Secondary | ICD-10-CM

## 2019-08-11 DIAGNOSIS — K50019 Crohn's disease of small intestine with unspecified complications: Secondary | ICD-10-CM

## 2019-08-16 ENCOUNTER — Other Ambulatory Visit: Payer: Self-pay | Admitting: Gastroenterology

## 2019-08-17 ENCOUNTER — Ambulatory Visit
Admission: RE | Admit: 2019-08-17 | Discharge: 2019-08-17 | Disposition: A | Discharge status: Home / Self Care | Payer: Medicare Other | Source: Ambulatory Visit | Attending: Gastroenterology | Admitting: Gastroenterology

## 2019-08-17 DIAGNOSIS — K50019 Crohn's disease of small intestine with unspecified complications: Secondary | ICD-10-CM

## 2019-08-17 DIAGNOSIS — R933 Abnormal findings on diagnostic imaging of other parts of digestive tract: Secondary | ICD-10-CM

## 2019-08-17 MED ORDER — IOPAMIDOL (ISOVUE-300) INJECTION 61%
100.0000 mL | Freq: Once | INTRAVENOUS | Status: AC | PRN
  Administered 2019-08-17: 100 mL via INTRAVENOUS

## 2019-08-19 ENCOUNTER — Other Ambulatory Visit: Payer: Self-pay | Admitting: Internal Medicine

## 2019-08-19 DIAGNOSIS — M810 Age-related osteoporosis without current pathological fracture: Secondary | ICD-10-CM

## 2019-11-10 ENCOUNTER — Other Ambulatory Visit: Payer: Self-pay

## 2019-11-10 ENCOUNTER — Ambulatory Visit
Admission: RE | Admit: 2019-11-10 | Discharge: 2019-11-10 | Disposition: A | Discharge status: Home / Self Care | Payer: Medicare Other | Source: Ambulatory Visit | Attending: Internal Medicine | Admitting: Internal Medicine

## 2019-11-10 DIAGNOSIS — M810 Age-related osteoporosis without current pathological fracture: Secondary | ICD-10-CM

## 2020-05-02 ENCOUNTER — Other Ambulatory Visit: Payer: Self-pay | Admitting: Gastroenterology

## 2020-05-02 DIAGNOSIS — R933 Abnormal findings on diagnostic imaging of other parts of digestive tract: Secondary | ICD-10-CM

## 2020-05-02 DIAGNOSIS — K50019 Crohn's disease of small intestine with unspecified complications: Secondary | ICD-10-CM

## 2020-05-15 ENCOUNTER — Other Ambulatory Visit: Payer: Self-pay

## 2020-05-15 ENCOUNTER — Ambulatory Visit
Admission: RE | Admit: 2020-05-15 | Discharge: 2020-05-15 | Disposition: A | Discharge status: Home / Self Care | Payer: Medicare Other | Source: Ambulatory Visit | Attending: Gastroenterology | Admitting: Gastroenterology

## 2020-05-15 DIAGNOSIS — K50019 Crohn's disease of small intestine with unspecified complications: Secondary | ICD-10-CM

## 2020-05-15 DIAGNOSIS — R933 Abnormal findings on diagnostic imaging of other parts of digestive tract: Secondary | ICD-10-CM

## 2020-05-15 MED ORDER — IOPAMIDOL (ISOVUE-300) INJECTION 61%
100.0000 mL | Freq: Once | INTRAVENOUS | Status: AC | PRN
  Administered 2020-05-15: 100 mL via INTRAVENOUS

## 2020-07-20 ENCOUNTER — Other Ambulatory Visit: Payer: Self-pay | Admitting: Internal Medicine

## 2020-07-20 DIAGNOSIS — Z1231 Encounter for screening mammogram for malignant neoplasm of breast: Secondary | ICD-10-CM

## 2020-08-06 ENCOUNTER — Other Ambulatory Visit: Payer: Self-pay

## 2020-08-06 ENCOUNTER — Ambulatory Visit
Admission: RE | Admit: 2020-08-06 | Discharge: 2020-08-06 | Disposition: A | Discharge status: Home / Self Care | Payer: Medicare Other | Source: Ambulatory Visit | Attending: Internal Medicine | Admitting: Internal Medicine

## 2020-08-06 DIAGNOSIS — Z1231 Encounter for screening mammogram for malignant neoplasm of breast: Secondary | ICD-10-CM

## 2020-11-08 DIAGNOSIS — Z1159 Encounter for screening for other viral diseases: Secondary | ICD-10-CM | POA: Diagnosis not present

## 2020-12-03 DIAGNOSIS — Z8616 Personal history of COVID-19: Secondary | ICD-10-CM | POA: Diagnosis not present

## 2020-12-03 DIAGNOSIS — J069 Acute upper respiratory infection, unspecified: Secondary | ICD-10-CM | POA: Diagnosis not present

## 2020-12-03 DIAGNOSIS — J029 Acute pharyngitis, unspecified: Secondary | ICD-10-CM | POA: Diagnosis not present

## 2020-12-03 DIAGNOSIS — K5000 Crohn's disease of small intestine without complications: Secondary | ICD-10-CM | POA: Diagnosis not present

## 2020-12-04 DIAGNOSIS — J069 Acute upper respiratory infection, unspecified: Secondary | ICD-10-CM | POA: Diagnosis not present

## 2020-12-04 DIAGNOSIS — J029 Acute pharyngitis, unspecified: Secondary | ICD-10-CM | POA: Diagnosis not present

## 2020-12-19 DIAGNOSIS — R197 Diarrhea, unspecified: Secondary | ICD-10-CM | POA: Diagnosis not present

## 2020-12-19 DIAGNOSIS — J069 Acute upper respiratory infection, unspecified: Secondary | ICD-10-CM | POA: Diagnosis not present

## 2021-01-07 DIAGNOSIS — Z01812 Encounter for preprocedural laboratory examination: Secondary | ICD-10-CM | POA: Diagnosis not present

## 2021-01-10 DIAGNOSIS — D125 Benign neoplasm of sigmoid colon: Secondary | ICD-10-CM | POA: Diagnosis not present

## 2021-01-10 DIAGNOSIS — K50812 Crohn's disease of both small and large intestine with intestinal obstruction: Secondary | ICD-10-CM | POA: Diagnosis not present

## 2021-01-10 DIAGNOSIS — K5730 Diverticulosis of large intestine without perforation or abscess without bleeding: Secondary | ICD-10-CM | POA: Diagnosis not present

## 2021-01-12 IMAGING — MG DIGITAL SCREENING BILATERAL MAMMOGRAM WITH CAD
4 series · 4 of 4 positions shown · non-contrast
Comparison: Previous exam(s).

CLINICAL DATA: Screening.

EXAM:
DIGITAL SCREENING BILATERAL MAMMOGRAM WITH CAD

[L MLO]
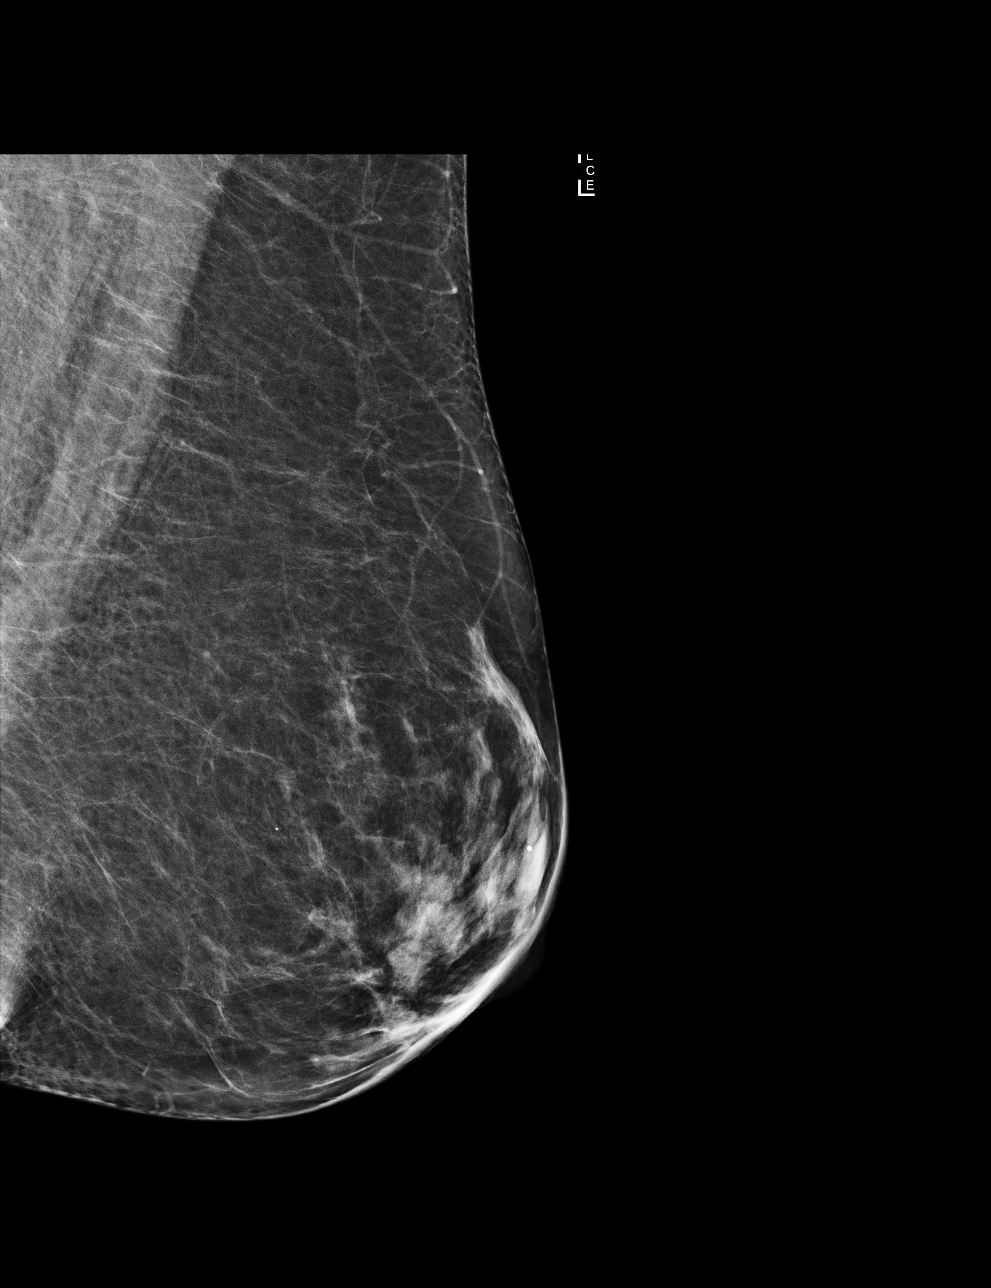

[R CC]
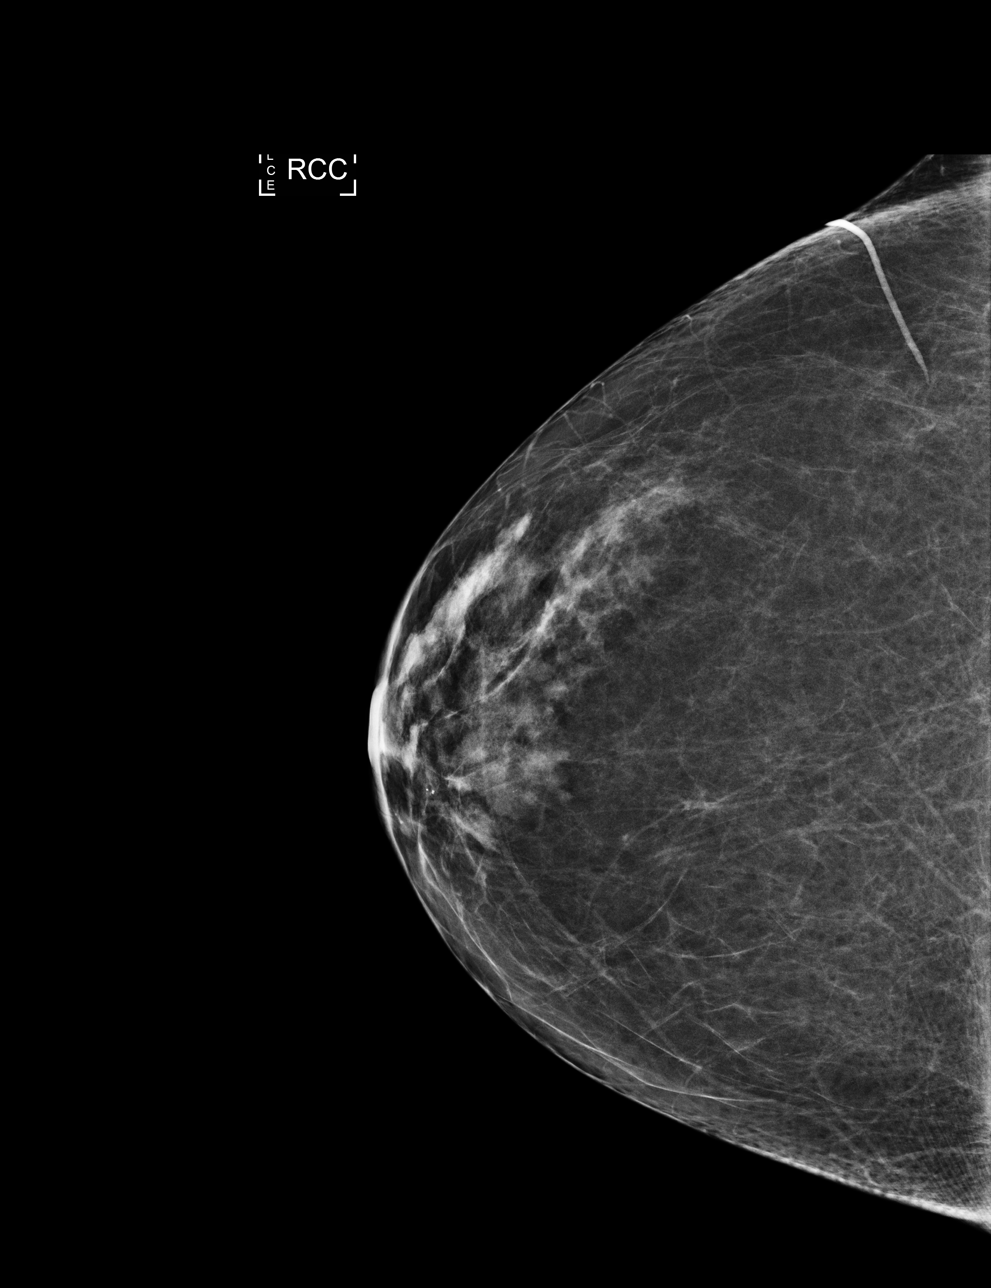

[R MLO]
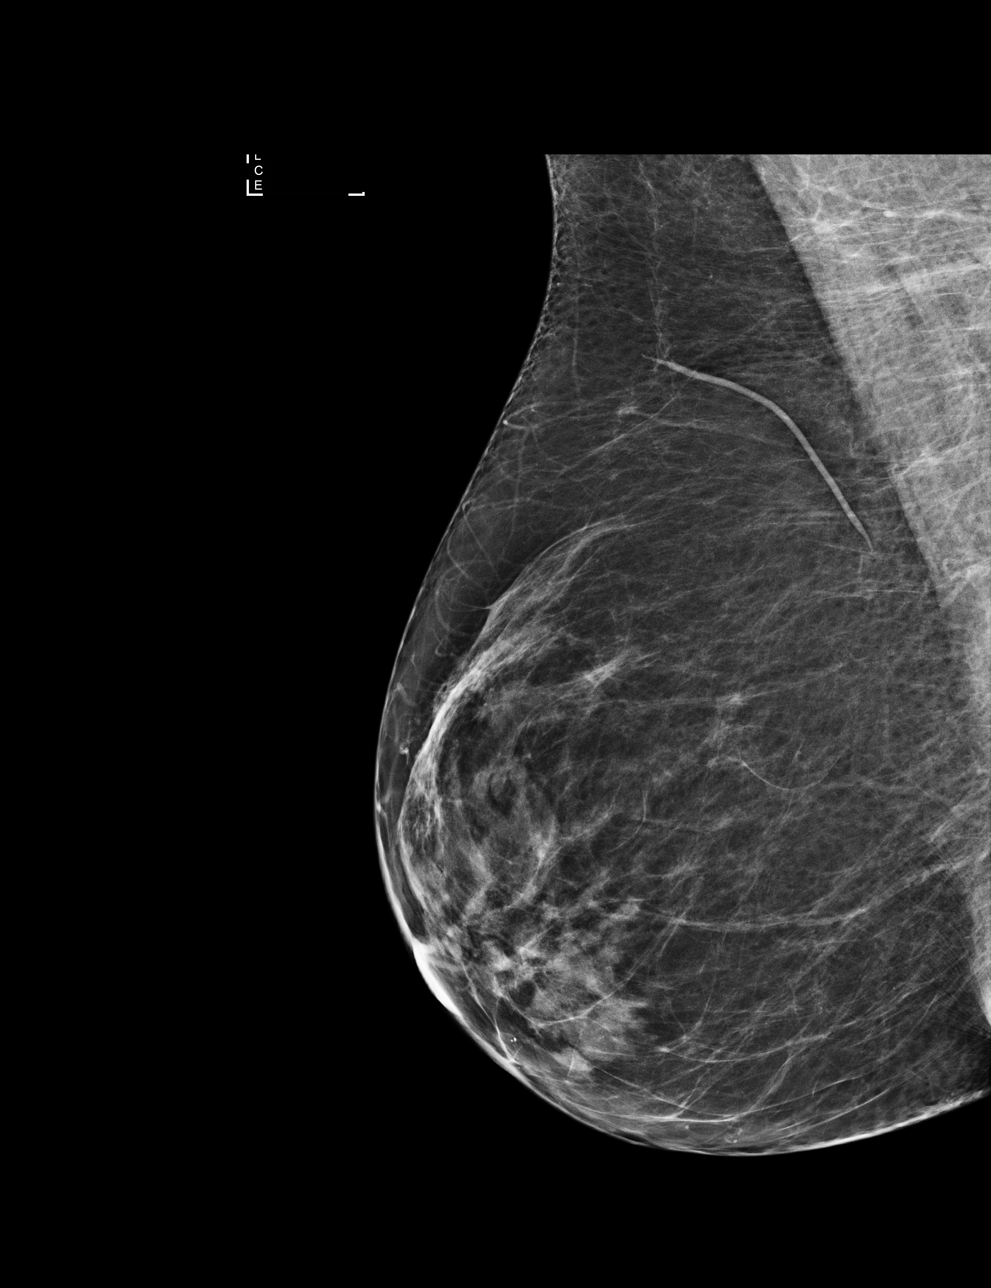

[L CC]
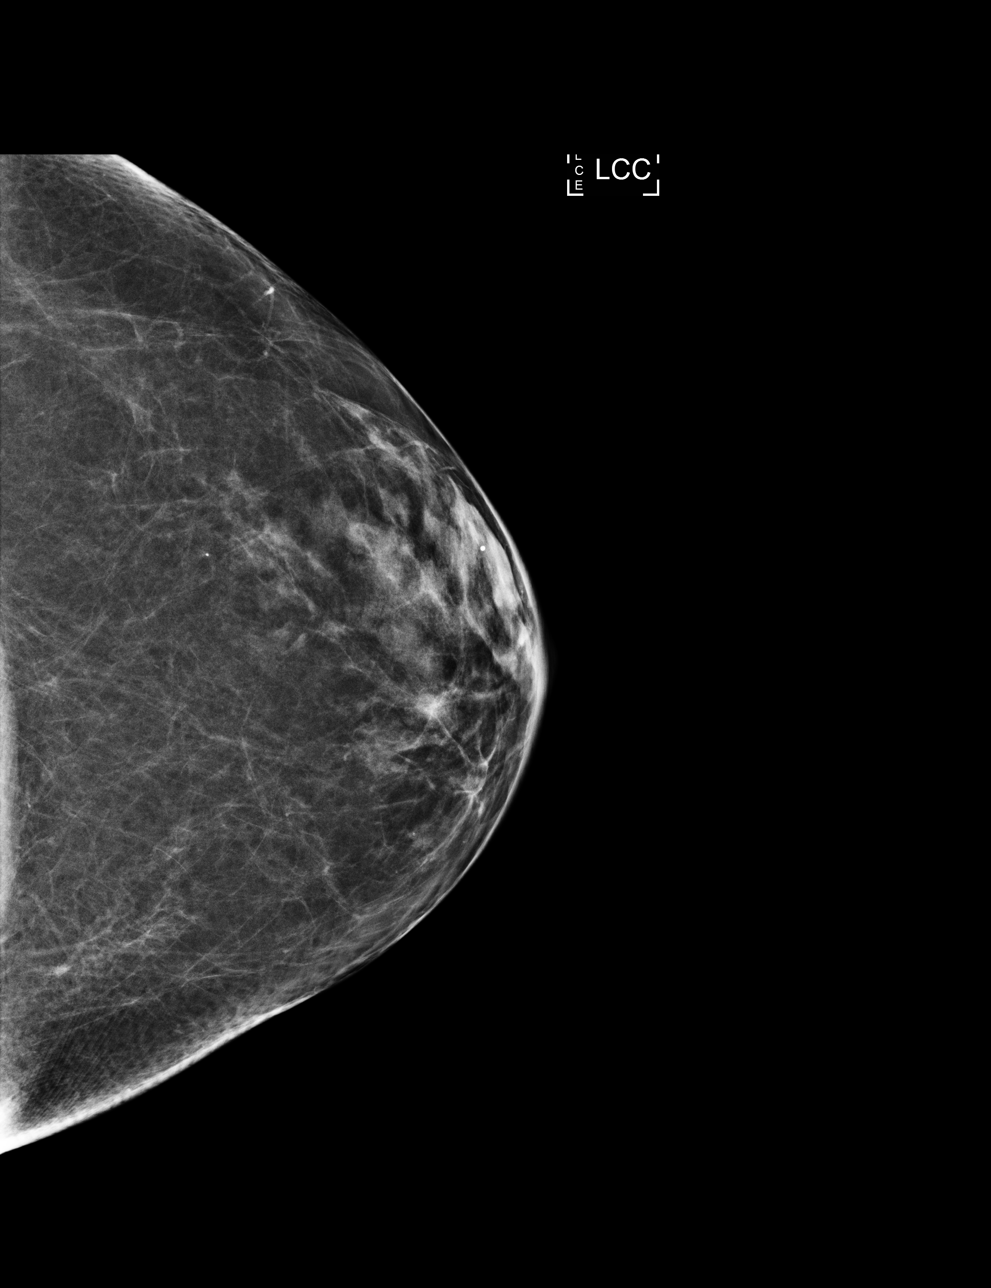

[4 of 4 positions shown; findings below may reference images not displayed]

ACR Breast Density Category b: There are scattered areas of
fibroglandular density.
FINDINGS: There are no findings suspicious for malignancy. Images were
processed with CAD.
IMPRESSION: No mammographic evidence of malignancy. A result letter of this
screening mammogram will be mailed directly to the patient.

RECOMMENDATION:
Screening mammogram in one year. (Code:AS-G-LCT)

BI-RADS CATEGORY  1: Negative.

## 2021-01-15 DIAGNOSIS — D125 Benign neoplasm of sigmoid colon: Secondary | ICD-10-CM | POA: Diagnosis not present

## 2021-03-19 DIAGNOSIS — K625 Hemorrhage of anus and rectum: Secondary | ICD-10-CM | POA: Diagnosis not present

## 2021-05-24 DIAGNOSIS — M1610 Unilateral primary osteoarthritis, unspecified hip: Secondary | ICD-10-CM | POA: Diagnosis not present

## 2021-05-24 DIAGNOSIS — I83893 Varicose veins of bilateral lower extremities with other complications: Secondary | ICD-10-CM | POA: Diagnosis not present

## 2021-05-24 DIAGNOSIS — M5431 Sciatica, right side: Secondary | ICD-10-CM | POA: Diagnosis not present

## 2021-06-17 DIAGNOSIS — K921 Melena: Secondary | ICD-10-CM | POA: Diagnosis not present

## 2021-06-17 DIAGNOSIS — K5000 Crohn's disease of small intestine without complications: Secondary | ICD-10-CM | POA: Diagnosis not present

## 2021-06-19 DIAGNOSIS — K625 Hemorrhage of anus and rectum: Secondary | ICD-10-CM | POA: Diagnosis not present

## 2021-06-19 DIAGNOSIS — Z1159 Encounter for screening for other viral diseases: Secondary | ICD-10-CM | POA: Diagnosis not present

## 2021-06-20 DIAGNOSIS — K625 Hemorrhage of anus and rectum: Secondary | ICD-10-CM | POA: Diagnosis not present

## 2021-06-21 DIAGNOSIS — I83813 Varicose veins of bilateral lower extremities with pain: Secondary | ICD-10-CM | POA: Diagnosis not present

## 2021-06-21 DIAGNOSIS — K5000 Crohn's disease of small intestine without complications: Secondary | ICD-10-CM | POA: Diagnosis not present

## 2021-07-10 ENCOUNTER — Other Ambulatory Visit: Payer: Self-pay | Admitting: Internal Medicine

## 2021-07-10 ENCOUNTER — Ambulatory Visit
Admission: RE | Admit: 2021-07-10 | Discharge: 2021-07-10 | Disposition: A | Discharge status: Home / Self Care | Payer: Medicare Other | Source: Ambulatory Visit | Attending: Internal Medicine | Admitting: Internal Medicine

## 2021-07-10 DIAGNOSIS — M1610 Unilateral primary osteoarthritis, unspecified hip: Secondary | ICD-10-CM | POA: Diagnosis not present

## 2021-07-10 DIAGNOSIS — M1711 Unilateral primary osteoarthritis, right knee: Secondary | ICD-10-CM | POA: Diagnosis not present

## 2021-07-10 DIAGNOSIS — M545 Low back pain, unspecified: Secondary | ICD-10-CM | POA: Diagnosis not present

## 2021-07-10 DIAGNOSIS — M5431 Sciatica, right side: Secondary | ICD-10-CM

## 2021-07-10 DIAGNOSIS — M1710 Unilateral primary osteoarthritis, unspecified knee: Secondary | ICD-10-CM

## 2021-07-10 DIAGNOSIS — M1712 Unilateral primary osteoarthritis, left knee: Secondary | ICD-10-CM | POA: Diagnosis not present

## 2021-07-15 DIAGNOSIS — M160 Bilateral primary osteoarthritis of hip: Secondary | ICD-10-CM | POA: Diagnosis not present

## 2021-07-15 DIAGNOSIS — K5000 Crohn's disease of small intestine without complications: Secondary | ICD-10-CM | POA: Diagnosis not present

## 2021-07-15 DIAGNOSIS — M5431 Sciatica, right side: Secondary | ICD-10-CM | POA: Diagnosis not present

## 2021-08-09 DIAGNOSIS — H2513 Age-related nuclear cataract, bilateral: Secondary | ICD-10-CM | POA: Diagnosis not present

## 2021-08-14 ENCOUNTER — Other Ambulatory Visit: Payer: Self-pay | Admitting: Internal Medicine

## 2021-08-14 DIAGNOSIS — Z0000 Encounter for general adult medical examination without abnormal findings: Secondary | ICD-10-CM | POA: Diagnosis not present

## 2021-08-14 DIAGNOSIS — Z8616 Personal history of COVID-19: Secondary | ICD-10-CM | POA: Diagnosis not present

## 2021-08-14 DIAGNOSIS — Z7189 Other specified counseling: Secondary | ICD-10-CM | POA: Diagnosis not present

## 2021-08-14 DIAGNOSIS — I83813 Varicose veins of bilateral lower extremities with pain: Secondary | ICD-10-CM | POA: Diagnosis not present

## 2021-08-14 DIAGNOSIS — M1710 Unilateral primary osteoarthritis, unspecified knee: Secondary | ICD-10-CM | POA: Diagnosis not present

## 2021-08-14 DIAGNOSIS — Z23 Encounter for immunization: Secondary | ICD-10-CM | POA: Diagnosis not present

## 2021-08-14 DIAGNOSIS — Z1231 Encounter for screening mammogram for malignant neoplasm of breast: Secondary | ICD-10-CM

## 2021-08-14 DIAGNOSIS — Z9049 Acquired absence of other specified parts of digestive tract: Secondary | ICD-10-CM | POA: Diagnosis not present

## 2021-08-14 DIAGNOSIS — K5000 Crohn's disease of small intestine without complications: Secondary | ICD-10-CM | POA: Diagnosis not present

## 2021-08-14 DIAGNOSIS — M1610 Unilateral primary osteoarthritis, unspecified hip: Secondary | ICD-10-CM | POA: Diagnosis not present

## 2021-08-14 DIAGNOSIS — E785 Hyperlipidemia, unspecified: Secondary | ICD-10-CM | POA: Diagnosis not present

## 2021-08-21 DIAGNOSIS — M160 Bilateral primary osteoarthritis of hip: Secondary | ICD-10-CM | POA: Diagnosis not present

## 2021-08-21 DIAGNOSIS — M5431 Sciatica, right side: Secondary | ICD-10-CM | POA: Diagnosis not present

## 2021-09-03 DIAGNOSIS — M1611 Unilateral primary osteoarthritis, right hip: Secondary | ICD-10-CM | POA: Diagnosis present

## 2021-09-09 DIAGNOSIS — M1611 Unilateral primary osteoarthritis, right hip: Secondary | ICD-10-CM | POA: Diagnosis not present

## 2021-09-09 NOTE — Progress Notes (Addendum)
Anesthesia Review:  PCP: [facility] They are to fax LOV note and  clearance. Labs and OV dated 08/14/21 on chart .  Cardiologist : none  Chest x-ray : EKG : Echo : Stress test: Cardiac Cath :  Activity level: can do a flight of stairs without difficutly  Sleep Study/ CPAP : none  Fasting Blood Sugar :      / Checks Blood Sugar -- times a day:   Blood Thinner/ Instructions /Last Dose: ASA / Instructions/ Last Dose :   No covid test ambulatory surgery

## 2021-09-09 NOTE — Progress Notes (Signed)
Your procedure is scheduled on:       09/24/21  Report to [facility] Main  Entrance   Report to admitting at   [place] AM     Call this number if you have problems the morning of surgery [phone]    REMEMBER: NO  SOLID FOOD CANDY OR GUM AFTER MIDNIGHT. CLEAR LIQUIDS UNTIL  0700am         . NOTHING BY MOUTH EXCEPT CLEAR LIQUIDS UNTIL    0700am   . PLEASE FINISH ENSURE DRINK PER SURGEON ORDER  WHICH NEEDS TO BE COMPLETED AT  0700am    .      CLEAR LIQUID DIET   Foods Allowed                                                                    Coffee and tea, regular and decaf                            Fruit ices (not with fruit pulp)                                      Iced Popsicles                                    Carbonated beverages, regular and diet                                    Cranberry, grape and apple juices Sports drinks like Gatorade Lightly seasoned clear broth or consume(fat free) Sugar, honey syrup ___________________________________________________________________      BRUSH YOUR TEETH MORNING OF SURGERY AND RINSE YOUR MOUTH OUT, NO CHEWING GUM CANDY OR MINTS.     Take these medicines the morning of surgery with A SIP OF WATER:   none   DO NOT TAKE ANY DIABETIC MEDICATIONS DAY OF YOUR SURGERY                               You may not have any metal on your body including hair pins and              piercings  Do not wear jewelry, make-up, lotions, powders or perfumes, deodorant             Do not wear nail polish on your fingernails.  Do not shave  48 hours prior to surgery.              Men may shave face and neck.   Do not bring valuables to the hospital. [place].  Contacts, dentures or bridgework may not be worn into surgery.  Leave suitcase in the car. After surgery it may be brought to your room.     Patients discharged the day of surgery will not  be allowed to drive home. IF YOU  ARE HAVING SURGERY AND GOING HOME THE SAME DAY, YOU MUST HAVE AN ADULT TO DRIVE YOU HOME AND BE WITH YOU FOR 24 HOURS. YOU MAY GO HOME BY TAXI OR UBER OR ORTHERWISE, BUT AN ADULT MUST ACCOMPANY YOU HOME AND STAY WITH YOU FOR 24 HOURS.  Name and phone number of your driver:  Special Instructions: N/A              Please read over the following fact sheets you were given: _____________________________________________________________________  [facility] - Preparing for Surgery Before surgery, you can play an important role.  Because skin is not sterile, your skin needs to be as free of germs as possible.  You can reduce the number of germs on your skin by washing with CHG (chlorahexidine gluconate) soap before surgery.  CHG is an antiseptic cleaner which kills germs and bonds with the skin to continue killing germs even after washing. Please DO NOT use if you have an allergy to CHG or antibacterial soaps.  If your skin becomes reddened/irritated stop using the CHG and inform your nurse when you arrive at Short Stay. Do not shave (including legs and underarms) for at least 48 hours prior to the first CHG shower.  You may shave your face/neck. Please follow these instructions carefully:  1.  Shower with CHG Soap the night before surgery and the  morning of Surgery.  2.  If you choose to wash your hair, wash your hair first as usual with your  normal  shampoo.  3.  After you shampoo, rinse your hair and body thoroughly to remove the  shampoo.                           4.  Use CHG as you would any other liquid soap.  You can apply chg directly  to the skin and wash                       Gently with a scrungie or clean washcloth.  5.  Apply the CHG Soap to your body ONLY FROM THE NECK DOWN.   Do not use on face/ open                           Wound or open sores. Avoid contact with eyes, ears mouth and genitals (private parts).                       Wash face,  Genitals (private parts) with your normal  soap.             6.  Wash thoroughly, paying special attention to the area where your surgery  will be performed.  7.  Thoroughly rinse your body with warm water from the neck down.  8.  DO NOT shower/wash with your normal soap after using and rinsing off  the CHG Soap.                9.  Pat yourself dry with a clean towel.            10.  Wear clean pajamas.            11.  Place clean sheets on your bed the night of your first shower and do not  sleep with pets. Day of Surgery : Do not apply  any lotions/deodorants the morning of surgery.  Please wear clean clothes to the hospital/surgery center.  FAILURE TO FOLLOW THESE INSTRUCTIONS MAY RESULT IN THE CANCELLATION OF YOUR SURGERY PATIENT SIGNATURE_________________________________  NURSE SIGNATURE__________________________________  ________________________________________________________________________

## 2021-09-10 NOTE — Care Plan (Signed)
Ortho Bundle Case Management Note  Patient Details  Name: Diane [name] MRN: [id number] Date of Birth: 16-Mar-1951   Met with patient prior to surgery. She will discharge to home with family to assist. Has equipment at home. HHPT referral to [facility]. OPPT set with [facility]. Patient and MD in agreement with plan. CHoice offered.                   DME Arranged:    DME Agency:     HH Arranged:  PT HH Agency:  [facility]  Additional Comments: Please contact me with any questions of if this plan should need to change.  Ladell Heads,  [facility]  [phone] 09/10/2021, 2:55 PM

## 2021-09-11 ENCOUNTER — Other Ambulatory Visit: Payer: Self-pay

## 2021-09-11 ENCOUNTER — Encounter (HOSPITAL_COMMUNITY)
Admission: RE | Admit: 2021-09-11 | Discharge: 2021-09-11 | Disposition: A | Discharge status: Home / Self Care | Payer: Medicare Other | Source: Ambulatory Visit | Attending: Orthopedic Surgery | Admitting: Orthopedic Surgery

## 2021-09-11 ENCOUNTER — Encounter (HOSPITAL_COMMUNITY): Payer: Self-pay

## 2021-09-11 VITALS — BP 111/69 | HR 84 | Temp 98.5°F | Resp 16 | Ht 62.0 in

## 2021-09-11 DIAGNOSIS — M1611 Unilateral primary osteoarthritis, right hip: Secondary | ICD-10-CM | POA: Insufficient documentation

## 2021-09-11 DIAGNOSIS — K5090 Crohn's disease, unspecified, without complications: Secondary | ICD-10-CM | POA: Insufficient documentation

## 2021-09-11 DIAGNOSIS — Z01812 Encounter for preprocedural laboratory examination: Secondary | ICD-10-CM | POA: Insufficient documentation

## 2021-09-11 DIAGNOSIS — Z01818 Encounter for other preprocedural examination: Secondary | ICD-10-CM

## 2021-09-11 HISTORY — DX: Unspecified convulsions: R56.9

## 2021-09-11 HISTORY — DX: Other psychoactive substance use, unspecified with psychoactive substance-induced psychotic disorder, unspecified: F19.959

## 2021-09-11 LAB — SURGICAL PCR SCREEN
MRSA, PCR: NEGATIVE
Staphylococcus aureus: NEGATIVE

## 2021-09-11 LAB — CBC
HCT: 39.4 % (ref 36.0–46.0)
Hemoglobin: 12.6 g/dL (ref 12.0–15.0)
MCH: 30.4 pg (ref 26.0–34.0)
MCHC: 32 g/dL (ref 30.0–36.0)
MCV: 94.9 fL (ref 80.0–100.0)
Platelets: 398 10*3/uL (ref 150–400)
RBC: 4.15 MIL/uL (ref 3.87–5.11)
RDW: 13.9 % (ref 11.5–15.5)
WBC: 10.1 10*3/uL (ref 4.0–10.5)
nRBC: 0 % (ref 0.0–0.2)

## 2021-09-11 LAB — BASIC METABOLIC PANEL
Anion gap: 9 (ref 5–15)
BUN: 12 mg/dL (ref 8–23)
CO2: 26 mmol/L (ref 22–32)
Calcium: 9.7 mg/dL (ref 8.9–10.3)
Chloride: 103 mmol/L (ref 98–111)
Creatinine, Ser: 0.72 mg/dL (ref 0.44–1.00)
GFR, Estimated: >60 mL/min (ref >60)
Glucose, Bld: 93 mg/dL (ref 70–99)
Potassium: 4.1 mmol/L (ref 3.5–5.1)
Sodium: 138 mmol/L (ref 135–145)

## 2021-09-17 ENCOUNTER — Ambulatory Visit: Payer: Medicare Other

## 2021-09-20 NOTE — Discharge Instructions (Addendum)
INSTRUCTIONS AFTER JOINT REPLACEMENT   Remove items at home which could result in a fall. This includes throw rugs or furniture in walking pathways ICE to the affected joint every three hours while awake for 30 minutes at a time, for at least the first 3-5 days, and then as needed for pain and swelling.  Continue to use ice for pain and swelling. You may notice swelling that will progress down to the foot and ankle.  This is normal after surgery.  Elevate your leg when you are not up walking on it.   Continue to use the breathing machine you got in the hospital (incentive spirometer) which will help keep your temperature down.  It is common for your temperature to cycle up and down following surgery, especially at night when you are not up moving around and exerting yourself.  The breathing machine keeps your lungs expanded and your temperature down.   DIET:  As you were doing prior to hospitalization, we recommend a well-balanced diet.  DRESSING / WOUND CARE / SHOWERING  You may shower 3 days after surgery, but keep the wounds dry during showering.  You may use an occlusive plastic wrap (Press'n Seal for example), NO SOAKING/SUBMERGING IN THE BATHTUB.  If the bandage gets wet, change with a clean dry gauze.  If the incision gets wet, pat the wound dry with a clean towel.  ACTIVITY  Increase activity slowly as tolerated, but follow the weight bearing instructions below.   No driving for 6 weeks or until further direction given by your physician.  You cannot drive while taking narcotics.  No lifting or carrying greater than 10 lbs. until further directed by your surgeon. Avoid periods of inactivity such as sitting longer than an hour when not asleep. This helps prevent blood clots.  You may return to work once you are authorized by your doctor.     WEIGHT BEARING   Weight bearing as tolerated with assist device (walker, cane, etc) as directed, use it as long as suggested by your surgeon or  therapist, typically at least 4-6 weeks.   EXERCISES  Results after joint replacement surgery are often greatly improved when you follow the exercise, range of motion and muscle strengthening exercises prescribed by your doctor. Safety measures are also important to protect the joint from further injury. Any time any of these exercises cause you to have increased pain or swelling, decrease what you are doing until you are comfortable again and then slowly increase them. If you have problems or questions, call your caregiver or physical therapist for advice.   Rehabilitation is important following a joint replacement. After just a few days of immobilization, the muscles of the leg can become weakened and shrink (atrophy).  These exercises are designed to build up the tone and strength of the thigh and leg muscles and to improve motion. Often times heat used for twenty to thirty minutes before working out will loosen up your tissues and help with improving the range of motion but do not use heat for the first two weeks following surgery (sometimes heat can increase post-operative swelling).   These exercises can be done on a training (exercise) mat, on the floor, on a table or on a bed. Use whatever works the best and is most comfortable for you.    Use music or television while you are exercising so that the exercises are a pleasant break in your day. This will make your life better with the exercises acting  as a break in your routine that you can look forward to.   Perform all exercises about fifteen times, three times per day or as directed.  You should exercise both the operative leg and the other leg as well.  Exercises include:   Quad Sets - Tighten up the muscle on the front of the thigh (Quad) and hold for 5-10 seconds.   Straight Leg Raises - With your knee straight (if you were given a brace, keep it on), lift the leg to 60 degrees, hold for 3 seconds, and slowly lower the leg.  Perform this  exercise against resistance later as your leg gets stronger.  Leg Slides: Lying on your back, slowly slide your foot toward your buttocks, bending your knee up off the floor (only go as far as is comfortable). Then slowly slide your foot back down until your leg is flat on the floor again.  Angel Wings: Lying on your back spread your legs to the side as far apart as you can without causing discomfort.  Hamstring Strength:  Lying on your back, push your heel against the floor with your leg straight by tightening up the muscles of your buttocks.  Repeat, but this time bend your knee to a comfortable angle, and push your heel against the floor.  You may put a pillow under the heel to make it more comfortable if necessary.   A rehabilitation program following joint replacement surgery can speed recovery and prevent re-injury in the future due to weakened muscles. Contact your doctor or a physical therapist for more information on knee rehabilitation.    CONSTIPATION  Constipation is defined medically as fewer than three stools per week and severe constipation as less than one stool per week.  Even if you have a regular bowel pattern at home, your normal regimen is likely to be disrupted due to multiple reasons following surgery.  Combination of anesthesia, postoperative narcotics, change in appetite and fluid intake all can affect your bowels.   YOU MUST use at least one of the following options; they are listed in order of increasing strength to get the job done.  They are all available over the counter, and you may need to use some, POSSIBLY even all of these options:    Drink plenty of fluids (prune juice may be helpful) and high fiber foods Colace 100 mg by mouth twice a day  Senokot for constipation as directed and as needed Dulcolax (bisacodyl), take with full glass of water  Miralax (polyethylene glycol) once or twice a day as needed.  If you have tried all these things and are unable to have a  bowel movement in the first 3-4 days after surgery call either your surgeon or your primary doctor.    If you experience loose stools or diarrhea, hold the medications until you stool forms back up.  If your symptoms do not get better within 1 week or if they get worse, check with your doctor.  If you experience "the worst abdominal pain ever" or develop nausea or vomiting, please contact the office immediately for further recommendations for treatment.   ITCHING:  If you experience itching with your medications, try taking only a single pain pill, or even half a pain pill at a time.  You can also use Benadryl over the counter for itching or also to help with sleep.   TED HOSE STOCKINGS:  Use stockings on both legs until for at least 2 weeks or as directed by  physician office. They may be removed at night for sleeping.  MEDICATIONS:  See your medication summary on the "After Visit Summary" that nursing will review with you.  You may have some home medications which will be placed on hold until you complete the course of blood thinner medication.  It is important for you to complete the blood thinner medication as prescribed.  PRECAUTIONS:  If you experience chest pain or shortness of breath - call 911 immediately for transfer to the hospital emergency department.   If you develop a fever greater that 101 F, purulent drainage from wound, increased redness or drainage from wound, foul odor from the wound/dressing, or calf pain - CONTACT YOUR SURGEON.                                                   FOLLOW-UP APPOINTMENTS:  If you do not already have a post-op appointment, please call the office for an appointment to be seen by your surgeon.  Guidelines for how soon to be seen are listed in your "After Visit Summary", but are typically between 1-4 weeks after surgery.  OTHER INSTRUCTIONS:   POST-OPERATIVE OPIOID TAPER INSTRUCTIONS: It is important to wean off of your opioid medication as soon as  possible. If you do not need pain medication after your surgery it is ok to stop day one. Opioids include: Codeine, Hydrocodone(Norco, Vicodin), Oxycodone(Percocet, oxycontin) and hydromorphone amongst others.  Long term and even short term use of opiods can cause: Increased pain response Dependence Constipation Depression Respiratory depression And more.  Withdrawal symptoms can include Flu like symptoms Nausea, vomiting And more Techniques to manage these symptoms Hydrate well Eat regular healthy meals Stay active Use relaxation techniques(deep breathing, meditating, yoga) Do Not substitute Alcohol to help with tapering If you have been on opioids for less than two weeks and do not have pain than it is ok to stop all together.  Plan to wean off of opioids This plan should start within one week post op of your joint replacement. Maintain the same interval or time between taking each dose and first decrease the dose.  Cut the total daily intake of opioids by one tablet each day Next start to increase the time between doses. The last dose that should be eliminated is the evening dose.   MAKE SURE YOU:  Understand these instructions.  Get help right away if you are not doing well or get worse.    Thank you for letting us be a part of your medical care team.  It is a privilege we respect greatly.  We hope these instructions will help you stay on track for a fast and full recovery!     Information on my medicine - XARELTO (Rivaroxaban)     Why was Xarelto prescribed for you? Xarelto was prescribed for you to reduce the risk of blood clots forming after orthopedic surgery. The medical term for these abnormal blood clots is venous thromboembolism (VTE).  What do you need to know about xarelto ? Take your Xarelto ONCE DAILY at the same time every day. You may take it either with or without food.  If you have difficulty swallowing the tablet whole, you may crush it and mix  in applesauce just prior to taking your dose.  Take Xarelto exactly as prescribed by your doctor and DO  NOT stop taking Xarelto without talking to the doctor who prescribed the medication.  Stopping without other VTE prevention medication to take the place of Xarelto may increase your risk of developing a clot.  After discharge, you should have regular check-up appointments with your healthcare provider that is prescribing your Xarelto.    What do you do if you miss a dose? If you miss a dose, take it as soon as you remember on the same day then continue your regularly scheduled once daily regimen the next day. Do not take two doses of Xarelto on the same day.   Important Safety Information A possible side effect of Xarelto is bleeding. You should call your healthcare provider right away if you experience any of the following: Bleeding from an injury or your nose that does not stop. Unusual colored urine (red or dark brown) or unusual colored stools (red or black). Unusual bruising for unknown reasons. A serious fall or if you hit your head (even if there is no bleeding).  Some medicines may interact with Xarelto and might increase your risk of bleeding while on Xarelto. To help avoid this, consult your healthcare provider or pharmacist prior to using any new prescription or non-prescription medications, including herbals, vitamins, non-steroidal anti-inflammatory drugs (NSAIDs) and supplements.  This website has more information on Xarelto: [email or web address]

## 2021-09-20 NOTE — H&P (Addendum)
HIP ARTHROPLASTY ADMISSION H&P  Patient ID: Diane [name] MRN: [id number] DOB/AGE: Feb 14, 1951 70 y.o.  Chief Complaint: right hip pain.  Planned Procedure Date: 09/24/21 Medical Clearance by Dr. Mertha Finders    HPI: Diane [name] is a 70 y.o. female who presents for evaluation of djd right hip. The patient has a history of pain and functional disability in the right hip due to arthritis and has failed non-surgical conservative treatments for greater than 12 weeks to include NSAID's and/or analgesics, corticosteriod injections, and activity modification.  Onset of symptoms was gradual, starting >10 years ago with gradually worsening course since that time. The patient noted no past surgery on the right hip.  Patient currently rates pain at 10 out of 10 with activity. Patient has worsening of pain with activity and weight bearing and pain that interferes with activities of daily living.  Patient has evidence of joint space narrowing by imaging studies.  There is no active infection.  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[place]) not followed by a neurologist    with hx of seizure - 2015   SUI (stress urinary incontinence, female)    Past Surgical History:  Procedure Laterality Date   4 repair of prolapse      ABDOMINAL HYSTERECTOMY  1980   ANTERIOR AND POSTERIOR REPAIR  06/13/2013   Procedure:  AUGUEMENTED  RECTOCELE REPAIR WITH XENFORM;  Surgeon: Ailene Rud, MD;  Location: [facility];  Service: Urology;;   BENIGN EXCISION RIGHT BREAST CYST  1996   BREAST EXCISIONAL BIOPSY Right 1996   CHOLECYSTECTOMY N/A 03/30/2017   Procedure: LAPAROSCOPIC CHOLECYSTECTOMY;  Surgeon: Rolm Bookbinder, MD;  Location: [facility];  Service: General;  Laterality: N/A;   CYSTOSCOPY N/A 06/13/2013   Procedure: Consuela Mimes;  Surgeon: Ailene Rud, MD;  Location: [facility];  Service: Urology;  Laterality: N/A;   ENDOSCOPIC RETROGRADE CHOLANGIOPANCREATOGRAPHY (ERCP) WITH PROPOFOL N/A 03/29/2017   Procedure: ENDOSCOPIC RETROGRADE CHOLANGIOPANCREATOGRAPHY (ERCP) WITH PROPOFOL;  Surgeon: Ronnette Juniper, MD;  Location: [facility];  Service: Gastroenterology;  Laterality: N/A;  prone position   EXPLORATORY LAPAROTOMY  EARLY 1970'S   INFERLITITY   EXPLORATORY LAPAROTOMY     endometriosis   LAPAROSCOPY/ LYSIS ADHESIONS/ LEFT SALPINGOOPHORECTOMY/ RIGHT OVARIAN CYSTECTOMY  06/08/2000   PUBOVAGINAL SLING N/A 06/13/2013   Procedure: PELVIC FLOOR REPAIR  ,  WITH  UPHOLD  , ANTERIOR VAULT WITH KELLY PLICATION WITH LYNX SLING;  Surgeon: Ailene Rud, MD;  Location: [facility];  Service: Urology;  Laterality: N/A;   WISDOM TOOTH EXTRACTION     Allergies  Allergen Reactions   Celebrex [Celecoxib] Anaphylaxis   Prednisone Other (See Comments)    Severe psychosis requiring hospitalization.  Avoid use if possible.     Actonel [Risedronate Sodium] Other (See Comments)    GI UPSET   Albuterol Other (See Comments)  sobbing   Ammonium-Containing Compounds     Unknown reaction   Amoxicillin Diarrhea and Nausea And Vomiting   Boniva [Ibandronic Acid] Other (See Comments)    GI UPSET   Chantix [Varenicline] Other (See Comments)    MOOD CHANGE   Fosamax [Alendronate Sodium] Other (See Comments)    GI UPSET   Imuran [Azathioprine] Diarrhea and Nausea And Vomiting   Nitrofuran Derivatives Other (See Comments)    CHILLS AND FEVER   Penicillins Diarrhea,  Nausea And Vomiting and Other (See Comments)   Prolia [Denosumab] Other (See Comments)    Irritates chrons   Tobradex [Tobramycin-Dexamethasone] Other (See Comments)    SEVERE EYE IRRIATION   Vagifem [Estradiol] Other (See Comments)    bleeding   Neosporin [Neomycin-Bacitracin Zn-Polymyx] Rash   Prior to Admission medications   Medication Sig Start Date End Date Taking? Authorizing Provider  acetaminophen (TYLENOL) 500 MG tablet Take 1,000 mg by mouth 3 (three) times daily.   Yes [provider]  Calcium Carbonate-Vit D-Min (CALCIUM 600+D3 PLUS MINERALS) 600-800 MG-UNIT TABS Take 2 tablets by mouth daily.   Yes [provider]  mesalamine (APRISO) 0.375 g 24 hr capsule Take 1.5 g by mouth every morning. 06/27/21  Yes [provider]  mesalamine (CANASA) 1000 MG suppository Place 1 suppository rectally daily as needed (rectal bleeding). 06/20/21  Yes [provider]  Polyethyl Glycol-Propyl Glycol (SYSTANE OP) Place 1-3 drops into both eyes 2 (two) times daily.   Yes [provider]  Probiotic Product (ALIGN) 4 MG CAPS Take 4 mg by mouth at bedtime.   Yes [provider]  traMADol (ULTRAM) 50 MG tablet Take 1 tablet (50 mg total) by mouth every 6 (six) hours as needed. Patient taking differently: Take 50 mg by mouth daily at 12 noon. 03/31/17  Yes Aline August, MD   Social History   Socioeconomic History   Marital status: Married    Spouse name: Not on file   Number of children: Not on file   Years of education: Not on file   Highest education level: Not on file  Occupational History   Not on file  Tobacco Use   Smoking status: Former    Packs/day: 0.75    Years: 40.00    Pack years: 30.00    Types: Cigarettes   Smokeless tobacco: Never  Vaping Use   Vaping Use: Never used  Substance and Sexual Activity   Alcohol use: Not Currently    Alcohol/week: 7.0 standard drinks    Types: 7 Glasses of wine per week    Comment: not in  years   Drug use: Never   Sexual activity: Not on file  Other Topics Concern   Not on file  Social History Narrative   Not on file   Social Determinants of Health   Financial Resource Strain: Not on file  Food Insecurity: Not on file  Transportation Needs: Not on file  Physical Activity: Not on file  Stress: Not on file  Social Connections: Not on file   Family History  Problem Relation Age of Onset   Dementia Mother    Stroke Mother     ROS: Currently denies lightheadedness, dizziness, Fever, chills, CP, SOB.   No personal history of DVT, PE, MI, or CVA. No loose teeth or dentures All other systems have been reviewed and were otherwise currently negative with the exception of those mentioned in the HPI and as above.  Objective: Vitals: Ht: 5'2" Wt: 142.4 lbs  Temp: 97.6 BP: 120/74 Pulse: 88 O2 96% on room air.   Physical Exam: General: Alert, NAD.  HEENT: EOMI, Good Neck Extension  Pulm: No increased work of breathing.  Clear B/L A/P w/o crackle or wheeze.  CV: RRR, No m/g/r appreciated  GI: soft, NT, ND Neuro: Neuro without gross focal deficit.  Sensation intact distally Skin: No lesions in the area of chief complaint MSK/Surgical Site: right hip pain with passive ROM. 0-50 deg FF. Internal rotation to neutral. 60 deg external rotation. Positive Stinchfield.  5/5 strength.  NVI.    Imaging Review Plain radiographs demonstrate severe degenerative joint disease of bilateral hips.   The bone quality appears to be adequate for age and reported activity level.  Preoperative templating of the joint replacement has been completed, documented, and submitted to the Operating Room personnel in order to optimize intra-operative equipment management.  Assessment: djd right hip Principal Problem:   Osteoarthritis of right hip   Plan: Plan for Procedure(s): TOTAL HIP ARTHROPLASTY  The patient history, physical exam, clinical judgement of the provider and imaging are  consistent with end stage degenerative joint disease and total joint arthroplasty is deemed medically necessary. The treatment options including medical management, injection therapy, and arthroplasty were discussed at length. The risks and benefits of Procedure(s): TOTAL HIP ARTHROPLASTY were presented and reviewed.  The risks of nonoperative treatment, versus surgical intervention including but not limited to continued pain, aseptic loosening, stiffness, dislocation/subluxation, infection, bleeding, nerve injury, blood clots, cardiopulmonary complications, morbidity, mortality, among others were discussed. The patient verbalizes understanding and wishes to proceed with the plan.  Patient is being admitted for surgery, pain control, PT, prophylactic antibiotics, VTE prophylaxis, progressive ambulation, ADL's and discharge planning.   Dental prophylaxis discussed and recommended for 2 years postoperatively.  The patient does meet the criteria for TXA which will be used perioperatively.   Xarelto 32m will be used postoperatively for DVT prophylaxis in addition to SCDs, and early ambulation. The patient is planning to be discharged home with OPPT in care of husband  BVentura Bruns PHershal Coria11/18/2022 8:43 AM

## 2021-09-24 ENCOUNTER — Ambulatory Visit (HOSPITAL_COMMUNITY): Payer: Medicare Other | Admitting: Physician Assistant

## 2021-09-24 ENCOUNTER — Ambulatory Visit (HOSPITAL_COMMUNITY): Payer: Medicare Other

## 2021-09-24 ENCOUNTER — Encounter (HOSPITAL_COMMUNITY): Payer: Self-pay | Admitting: Orthopedic Surgery

## 2021-09-24 ENCOUNTER — Encounter (HOSPITAL_COMMUNITY)
Admission: RE | Disposition: A | Discharge status: Home / Self Care | Payer: Self-pay | Source: Ambulatory Visit | Attending: Orthopedic Surgery

## 2021-09-24 ENCOUNTER — Other Ambulatory Visit: Payer: Self-pay

## 2021-09-24 ENCOUNTER — Ambulatory Visit (HOSPITAL_COMMUNITY)
Admission: RE | Admit: 2021-09-24 | Discharge: 2021-09-25 | Disposition: A | Discharge status: Home / Self Care | Payer: Medicare Other | Source: Ambulatory Visit | Attending: Orthopedic Surgery | Admitting: Orthopedic Surgery

## 2021-09-24 DIAGNOSIS — Z01818 Encounter for other preprocedural examination: Secondary | ICD-10-CM

## 2021-09-24 DIAGNOSIS — M6281 Muscle weakness (generalized): Secondary | ICD-10-CM | POA: Insufficient documentation

## 2021-09-24 DIAGNOSIS — J309 Allergic rhinitis, unspecified: Secondary | ICD-10-CM | POA: Diagnosis not present

## 2021-09-24 DIAGNOSIS — E440 Moderate protein-calorie malnutrition: Secondary | ICD-10-CM | POA: Diagnosis not present

## 2021-09-24 DIAGNOSIS — M1611 Unilateral primary osteoarthritis, right hip: Secondary | ICD-10-CM | POA: Diagnosis not present

## 2021-09-24 DIAGNOSIS — Z20822 Contact with and (suspected) exposure to covid-19: Secondary | ICD-10-CM | POA: Diagnosis not present

## 2021-09-24 DIAGNOSIS — Z87891 Personal history of nicotine dependence: Secondary | ICD-10-CM | POA: Diagnosis not present

## 2021-09-24 DIAGNOSIS — R262 Difficulty in walking, not elsewhere classified: Secondary | ICD-10-CM | POA: Diagnosis not present

## 2021-09-24 DIAGNOSIS — Z471 Aftercare following joint replacement surgery: Secondary | ICD-10-CM | POA: Diagnosis not present

## 2021-09-24 DIAGNOSIS — Z96641 Presence of right artificial hip joint: Secondary | ICD-10-CM | POA: Diagnosis not present

## 2021-09-24 DIAGNOSIS — M170 Bilateral primary osteoarthritis of knee: Secondary | ICD-10-CM | POA: Diagnosis not present

## 2021-09-24 HISTORY — PX: TOTAL HIP ARTHROPLASTY: SHX124

## 2021-09-24 LAB — SARS CORONAVIRUS 2 BY RT PCR (HOSPITAL ORDER, PERFORMED IN ~~LOC~~ HOSPITAL LAB): SARS Coronavirus 2: NEGATIVE

## 2021-09-24 SURGERY — ARTHROPLASTY, HIP, TOTAL,POSTERIOR APPROACH
Anesthesia: Spinal | Site: Hip | Laterality: Right

## 2021-09-24 MED ORDER — PHENOL 1.4 % MT LIQD: 1.0000 | OROMUCOSAL | Status: DC | PRN

## 2021-09-24 MED ORDER — 0.9 % SODIUM CHLORIDE (POUR BTL) OPTIME
TOPICAL | Status: DC | PRN
  Administered 2021-09-24: 1000 mL

## 2021-09-24 MED ORDER — FENTANYL CITRATE PF 50 MCG/ML IJ SOSY: 25.0000 ug | PREFILLED_SYRINGE | INTRAMUSCULAR | Status: DC | PRN

## 2021-09-24 MED ORDER — MIDAZOLAM HCL 5 MG/5ML IJ SOLN
INTRAMUSCULAR | Status: DC | PRN
  Administered 2021-09-24: 2 mg via INTRAVENOUS

## 2021-09-24 MED ORDER — SENNA-DOCUSATE SODIUM 8.6-50 MG PO TABS: 2.0000 | ORAL_TABLET | Freq: Every day | ORAL | 1 refills | Status: DC

## 2021-09-24 MED ORDER — FENTANYL CITRATE (PF) 100 MCG/2ML IJ SOLN
INTRAMUSCULAR | Status: DC | PRN
  Administered 2021-09-24: 50 ug via INTRAVENOUS

## 2021-09-24 MED ORDER — FENTANYL CITRATE (PF) 100 MCG/2ML IJ SOLN
INTRAMUSCULAR | Status: AC
  Filled 2021-09-24: qty 2

## 2021-09-24 MED ORDER — DIPHENHYDRAMINE HCL 12.5 MG/5ML PO ELIX: 12.5000 mg | ORAL_SOLUTION | ORAL | Status: DC | PRN

## 2021-09-24 MED ORDER — ONDANSETRON HCL 4 MG/2ML IJ SOLN
INTRAMUSCULAR | Status: AC
  Filled 2021-09-24: qty 2

## 2021-09-24 MED ORDER — ACETAMINOPHEN 500 MG PO TABS: 1000.0000 mg | ORAL_TABLET | Freq: Once | ORAL | Status: DC

## 2021-09-24 MED ORDER — EPHEDRINE SULFATE-NACL 50-0.9 MG/10ML-% IV SOSY: PREFILLED_SYRINGE | INTRAVENOUS | Status: DC | PRN

## 2021-09-24 MED ORDER — METOCLOPRAMIDE HCL 5 MG/ML IJ SOLN: 5.0000 mg | Freq: Three times a day (TID) | INTRAMUSCULAR | Status: DC | PRN

## 2021-09-24 MED ORDER — BUPIVACAINE HCL (PF) 0.25 % IJ SOLN
INTRAMUSCULAR | Status: AC
  Filled 2021-09-24: qty 30

## 2021-09-24 MED ORDER — ACETAMINOPHEN 500 MG PO TABS
1000.0000 mg | ORAL_TABLET | Freq: Once | ORAL | Status: AC
  Administered 2021-09-24: 1000 mg via ORAL
  Filled 2021-09-24: qty 2

## 2021-09-24 MED ORDER — PROPOFOL 1000 MG/100ML IV EMUL
INTRAVENOUS | Status: AC
  Filled 2021-09-24: qty 100

## 2021-09-24 MED ORDER — LACTATED RINGERS IV SOLN: INTRAVENOUS | Status: DC

## 2021-09-24 MED ORDER — EPHEDRINE SULFATE-NACL 50-0.9 MG/10ML-% IV SOSY
PREFILLED_SYRINGE | INTRAVENOUS | Status: DC | PRN
  Administered 2021-09-24: 5 mg via INTRAVENOUS

## 2021-09-24 MED ORDER — PROPOFOL 500 MG/50ML IV EMUL
INTRAVENOUS | Status: DC | PRN
  Administered 2021-09-24: 40 ug/kg/min via INTRAVENOUS

## 2021-09-24 MED ORDER — BUPIVACAINE HCL (PF) 0.25 % IJ SOLN
INTRAMUSCULAR | Status: DC | PRN
  Administered 2021-09-24: 30 mL

## 2021-09-24 MED ORDER — METOCLOPRAMIDE HCL 5 MG PO TABS: 5.0000 mg | ORAL_TABLET | Freq: Three times a day (TID) | ORAL | Status: DC | PRN

## 2021-09-24 MED ORDER — ARTIFICIAL TEARS OPHTHALMIC OINT
TOPICAL_OINTMENT | Freq: Two times a day (BID) | OPHTHALMIC | Status: DC
  Filled 2021-09-24: qty 3.5

## 2021-09-24 MED ORDER — KETOROLAC TROMETHAMINE 30 MG/ML IJ SOLN
INTRAMUSCULAR | Status: AC
  Filled 2021-09-24: qty 1

## 2021-09-24 MED ORDER — OXYCODONE HCL 5 MG PO TABS
5.0000 mg | ORAL_TABLET | ORAL | Status: DC | PRN
  Administered 2021-09-24 (×2): 5 mg via ORAL
  Administered 2021-09-24 – 2021-09-25 (×3): 10 mg via ORAL
  Filled 2021-09-24: qty 2
  Filled 2021-09-24: qty 1
  Filled 2021-09-24 (×3): qty 2

## 2021-09-24 MED ORDER — MEPERIDINE HCL 50 MG/ML IJ SOLN: 6.2500 mg | INTRAMUSCULAR | Status: DC | PRN

## 2021-09-24 MED ORDER — HYDROMORPHONE HCL 1 MG/ML IJ SOLN
0.5000 mg | INTRAMUSCULAR | Status: DC | PRN
  Administered 2021-09-24 – 2021-09-25 (×3): 1 mg via INTRAVENOUS
  Filled 2021-09-24 (×3): qty 1

## 2021-09-24 MED ORDER — PROMETHAZINE HCL 12.5 MG PO TABS: 12.5000 mg | ORAL_TABLET | Freq: Four times a day (QID) | ORAL | 0 refills | Status: DC | PRN

## 2021-09-24 MED ORDER — PHENYLEPHRINE 40 MCG/ML (10ML) SYRINGE FOR IV PUSH (FOR BLOOD PRESSURE SUPPORT)
PREFILLED_SYRINGE | INTRAVENOUS | Status: DC | PRN
  Administered 2021-09-24 (×3): 80 ug via INTRAVENOUS

## 2021-09-24 MED ORDER — DEXAMETHASONE SODIUM PHOSPHATE 10 MG/ML IJ SOLN
INTRAMUSCULAR | Status: AC
  Filled 2021-09-24: qty 1

## 2021-09-24 MED ORDER — EPHEDRINE 5 MG/ML INJ
INTRAVENOUS | Status: AC
  Filled 2021-09-24: qty 5

## 2021-09-24 MED ORDER — POVIDONE-IODINE 10 % EX SWAB
2.0000 "application " | Freq: Once | CUTANEOUS | Status: AC
  Administered 2021-09-24: 2 via TOPICAL

## 2021-09-24 MED ORDER — SENNOSIDES-DOCUSATE SODIUM 8.6-50 MG PO TABS: 1.0000 | ORAL_TABLET | Freq: Every evening | ORAL | Status: DC | PRN

## 2021-09-24 MED ORDER — RIVAROXABAN 10 MG PO TABS: 10.0000 mg | ORAL_TABLET | Freq: Every day | ORAL | 0 refills | Status: DC

## 2021-09-24 MED ORDER — MIDAZOLAM HCL 2 MG/2ML IJ SOLN
INTRAMUSCULAR | Status: AC
  Filled 2021-09-24: qty 2

## 2021-09-24 MED ORDER — MENTHOL 3 MG MT LOZG: 1.0000 | LOZENGE | OROMUCOSAL | Status: DC | PRN

## 2021-09-24 MED ORDER — PHENYLEPHRINE HCL (PRESSORS) 10 MG/ML IV SOLN
INTRAVENOUS | Status: AC
  Filled 2021-09-24: qty 2

## 2021-09-24 MED ORDER — MEPERIDINE HCL 50 MG/ML IJ SOLN
INTRAMUSCULAR | Status: AC
  Administered 2021-09-24: 6.5 mg via INTRAVENOUS
  Filled 2021-09-24: qty 1

## 2021-09-24 MED ORDER — DOCUSATE SODIUM 100 MG PO CAPS
100.0000 mg | ORAL_CAPSULE | Freq: Two times a day (BID) | ORAL | Status: DC
  Administered 2021-09-24 – 2021-09-25 (×2): 100 mg via ORAL
  Filled 2021-09-24 (×2): qty 1

## 2021-09-24 MED ORDER — TRANEXAMIC ACID-NACL 1000-0.7 MG/100ML-% IV SOLN
1000.0000 mg | INTRAVENOUS | Status: AC
  Administered 2021-09-24: 1000 mg via INTRAVENOUS
  Filled 2021-09-24: qty 100

## 2021-09-24 MED ORDER — RIVAROXABAN 10 MG PO TABS
10.0000 mg | ORAL_TABLET | Freq: Every day | ORAL | Status: DC
  Administered 2021-09-25: 10 mg via ORAL
  Filled 2021-09-24: qty 1

## 2021-09-24 MED ORDER — ACETAMINOPHEN 500 MG PO TABS
1000.0000 mg | ORAL_TABLET | Freq: Four times a day (QID) | ORAL | Status: AC
  Administered 2021-09-24 – 2021-09-25 (×4): 1000 mg via ORAL
  Filled 2021-09-24 (×4): qty 2

## 2021-09-24 MED ORDER — CHLORHEXIDINE GLUCONATE 0.12 % MT SOLN
15.0000 mL | Freq: Once | OROMUCOSAL | Status: AC
  Administered 2021-09-24: 15 mL via OROMUCOSAL

## 2021-09-24 MED ORDER — SORBITOL 70 % SOLN
30.0000 mL | Freq: Every day | Status: DC | PRN
  Filled 2021-09-24: qty 30

## 2021-09-24 MED ORDER — ONDANSETRON HCL 4 MG/2ML IJ SOLN
4.0000 mg | Freq: Four times a day (QID) | INTRAMUSCULAR | Status: DC | PRN
  Administered 2021-09-24: 4 mg via INTRAVENOUS
  Filled 2021-09-24: qty 2

## 2021-09-24 MED ORDER — ONDANSETRON HCL 4 MG PO TABS: 4.0000 mg | ORAL_TABLET | Freq: Four times a day (QID) | ORAL | Status: DC | PRN

## 2021-09-24 MED ORDER — ORAL CARE MOUTH RINSE: 15.0000 mL | Freq: Once | OROMUCOSAL | Status: AC

## 2021-09-24 MED ORDER — CEFAZOLIN SODIUM-DEXTROSE 2-4 GM/100ML-% IV SOLN
2.0000 g | INTRAVENOUS | Status: AC
  Administered 2021-09-24: 2 g via INTRAVENOUS
  Filled 2021-09-24: qty 100

## 2021-09-24 MED ORDER — FLEET ENEMA 7-19 GM/118ML RE ENEM: 1.0000 | ENEMA | Freq: Once | RECTAL | Status: DC | PRN

## 2021-09-24 MED ORDER — ACETAMINOPHEN 325 MG PO TABS: 325.0000 mg | ORAL_TABLET | Freq: Four times a day (QID) | ORAL | Status: DC | PRN

## 2021-09-24 MED ORDER — ONDANSETRON HCL 4 MG/2ML IJ SOLN
INTRAMUSCULAR | Status: DC | PRN
  Administered 2021-09-24: 4 mg via INTRAVENOUS

## 2021-09-24 MED ORDER — CEFAZOLIN SODIUM-DEXTROSE 1-4 GM/50ML-% IV SOLN
1.0000 g | Freq: Four times a day (QID) | INTRAVENOUS | Status: AC
  Administered 2021-09-24 – 2021-09-25 (×2): 1 g via INTRAVENOUS
  Filled 2021-09-24 (×3): qty 50

## 2021-09-24 MED ORDER — MEPERIDINE HCL 50 MG/ML IJ SOLN: 12.5000 mg | Freq: Once | INTRAMUSCULAR | Status: DC

## 2021-09-24 MED ORDER — SODIUM CHLORIDE 0.9 % IV SOLN: INTRAVENOUS | Status: DC

## 2021-09-24 MED ORDER — PHENYLEPHRINE HCL-NACL 20-0.9 MG/250ML-% IV SOLN
INTRAVENOUS | Status: DC | PRN
  Administered 2021-09-24: 20 ug/min via INTRAVENOUS

## 2021-09-24 MED ORDER — BUPIVACAINE HCL (PF) 0.5 % IJ SOLN
INTRAMUSCULAR | Status: DC | PRN
  Administered 2021-09-24: 3 mL via INTRATHECAL

## 2021-09-24 MED ORDER — OXYCODONE HCL 5 MG PO TABS: 5.0000 mg | ORAL_TABLET | ORAL | 0 refills | Status: DC | PRN

## 2021-09-24 MED ORDER — STERILE WATER FOR IRRIGATION IR SOLN
Status: DC | PRN
  Administered 2021-09-24: 2000 mL

## 2021-09-24 SURGICAL SUPPLY — 60 items
BAG COUNTER SPONGE SURGICOUNT (BAG) IMPLANT
BAG SPNG CNTER NS LX DISP (BAG)
BIT DRILL 2.0X128 (BIT) ×2 IMPLANT
BLADE SAW SAG 73X25 THK (BLADE) ×1
BLADE SAW SGTL 73X25 THK (BLADE) ×1 IMPLANT
CLSR STERI-STRIP ANTIMIC 1/2X4 (GAUZE/BANDAGES/DRESSINGS) ×4 IMPLANT
COVER SURGICAL LIGHT HANDLE (MISCELLANEOUS) ×2 IMPLANT
DRAPE INCISE IOBAN 66X45 STRL (DRAPES) ×2 IMPLANT
DRAPE ORTHO SPLIT 77X108 STRL (DRAPES) ×4
DRAPE POUCH INSTRU U-SHP 10X18 (DRAPES) ×2 IMPLANT
DRAPE SHEET LG 3/4 BI-LAMINATE (DRAPES) ×2 IMPLANT
DRAPE SURG 17X11 SM STRL (DRAPES) ×2 IMPLANT
DRAPE SURG ORHT 6 SPLT 77X108 (DRAPES) ×2 IMPLANT
DRAPE U-SHAPE 47X51 STRL (DRAPES) ×2 IMPLANT
DRSG AQUACEL AG ADV 3.5X10 (GAUZE/BANDAGES/DRESSINGS) ×1 IMPLANT
DRSG MEPILEX BORDER 4X8 (GAUZE/BANDAGES/DRESSINGS) ×2 IMPLANT
DURAPREP 26ML APPLICATOR (WOUND CARE) ×4 IMPLANT
ELECT BLADE TIP CTD 4 INCH (ELECTRODE) ×2 IMPLANT
ELECT REM PT RETURN 15FT ADLT (MISCELLANEOUS) ×2 IMPLANT
ELIMINATOR HOLE APEX DEPUY (Hips) ×1 IMPLANT
FACESHIELD WRAPAROUND (MASK) ×2 IMPLANT
FACESHIELD WRAPAROUND OR TEAM (MASK) ×1 IMPLANT
GLOVE SRG 8 PF TXTR STRL LF DI (GLOVE) ×1 IMPLANT
GLOVE SURG ENC MOIS LTX SZ7.5 (GLOVE) ×2 IMPLANT
GLOVE SURG POLYISO LF SZ6.5 (GLOVE) ×2 IMPLANT
GLOVE SURG UNDER POLY LF SZ7 (GLOVE) ×2 IMPLANT
GLOVE SURG UNDER POLY LF SZ8 (GLOVE) ×2
GOWN STRL REUS W/TWL LRG LVL3 (GOWN DISPOSABLE) ×4 IMPLANT
HEAD CERAMIC DELTA 36 PLUS 1.5 (Hips) ×1 IMPLANT
HOOD PEEL AWAY FLYTE STAYCOOL (MISCELLANEOUS) ×6 IMPLANT
KIT BASIN OR (CUSTOM PROCEDURE TRAY) ×2 IMPLANT
KIT TURNOVER KIT A (KITS) IMPLANT
LINER NEUTRAL 52X36MM PLUS 4 (Liner) ×1 IMPLANT
MANIFOLD NEPTUNE II (INSTRUMENTS) ×2 IMPLANT
NDL MA TROC 1/2 (NEEDLE) IMPLANT
NDL SAFETY ECLIPSE 18X1.5 (NEEDLE) ×2 IMPLANT
NEEDLE HYPO 18GX1.5 SHARP (NEEDLE) ×4
NEEDLE MA TROC 1/2 (NEEDLE) ×2 IMPLANT
NS IRRIG 1000ML POUR BTL (IV SOLUTION) ×2 IMPLANT
PACK TOTAL JOINT (CUSTOM PROCEDURE TRAY) ×2 IMPLANT
PIN SECTOR W/GRIP ACE CUP 52MM (Hips) ×1 IMPLANT
PROTECTOR NERVE ULNAR (MISCELLANEOUS) ×2 IMPLANT
RETRIEVER SUT HEWSON (MISCELLANEOUS) ×2 IMPLANT
SCREW 6.5MMX25MM (Screw) ×1 IMPLANT
STEM OFFSET DUOFIX SZ6 STD (Stem) ×1 IMPLANT
SUCTION FRAZIER HANDLE 12FR (TUBING) ×2
SUCTION TUBE FRAZIER 12FR DISP (TUBING) ×1 IMPLANT
SUT FIBERWIRE #2 38 REV NDL BL (SUTURE) ×6
SUT VIC AB 0 CT1 36 (SUTURE) ×2 IMPLANT
SUT VIC AB 1 CT1 36 (SUTURE) ×4 IMPLANT
SUT VIC AB 2-0 CT1 27 (SUTURE) ×4
SUT VIC AB 2-0 CT1 TAPERPNT 27 (SUTURE) ×2 IMPLANT
SUT VIC AB 3-0 SH 8-18 (SUTURE) ×2 IMPLANT
SUTURE FIBERWR#2 38 REV NDL BL (SUTURE) ×3 IMPLANT
SYR CONTROL 10ML LL (SYRINGE) ×4 IMPLANT
TAPE STRIPS DRAPE STRL (GAUZE/BANDAGES/DRESSINGS) ×1 IMPLANT
TOWEL OR 17X26 10 PK STRL BLUE (TOWEL DISPOSABLE) ×2 IMPLANT
TRAY FOLEY MTR SLVR 16FR STAT (SET/KITS/TRAYS/PACK) ×2 IMPLANT
TUBE SUCTION HIGH CAP CLEAR NV (SUCTIONS) ×2 IMPLANT
WATER STERILE IRR 1000ML POUR (IV SOLUTION) ×4 IMPLANT

## 2021-09-24 NOTE — Interval H&P Note (Signed)
The risks benefits and alternatives were discussed with the patient including but not limited to the risks of nonoperative treatment, versus surgical intervention including infection, bleeding, nerve injury, periprosthetic fracture, the need for revision surgery, dislocation, leg length discrepancy, blood clots, cardiopulmonary complications, morbidity, mortality, among others, and they were willing to proceed.    The patient has been re-examined, and the chart reviewed, and there have been no interval changes to the documented history and physical.    The risks, benefits, and alternatives have been discussed at length, and the patient is willing to proceed.    Johnny Bridge, MD

## 2021-09-24 NOTE — Op Note (Signed)
09/24/2021  1:28 PM  PATIENT:  Diane [name]   MRN: [id number]  PRE-OPERATIVE DIAGNOSIS: Right hip primary localized osteoarthritis  POST-OPERATIVE DIAGNOSIS:  same  PROCEDURE:  Procedure(s):  RIGHT TOTAL HIP ARTHROPLASTY  PREOPERATIVE INDICATIONS:    Diane [name] is an 70 y.o. female who has a diagnosis of right hip primary localized osteoarthritis and elected for surgical management after failing conservative treatment.  The risks benefits and alternatives were discussed with the patient including but not limited to the risks of nonoperative treatment, versus surgical intervention including infection, bleeding, nerve injury, periprosthetic fracture, the need for revision surgery, dislocation, leg length discrepancy, blood clots, cardiopulmonary complications, morbidity, mortality, among others, and they were willing to proceed.     OPERATIVE REPORT     SURGEON:  Marchia Bond, MD    ASSISTANT: Chriss Czar, PA-C, (Present throughout the entire procedure,  necessary for completion of procedure in a timely manner, assisting with retraction, instrumentation, and closure)     ANESTHESIA: Spinal  ESTIMATED BLOOD LOSS: 321 mL    COMPLICATIONS:  None.     UNIQUE ASPECTS OF THE CASE: There was advanced arthrosis of the femoral head and acetabulum.  Her bone quality was mediocre, the augmentation screw held, but was not impressively strong.  I felt like I had a good press-fit in the cup felt stable, and just a small amount of anterior wall showing.  The hip was stable.  I debated whether I was lengthening her or not, it was a little hard to compared to the contralateral side, however the other side is short and I would expect that we will need to do the other side in the future.  I reamed up to a size 7, but the broach was tight at a 6 and I could not quite get it down fully, I had 1 tooth showing.  I suspect she was larger distally than proximally.  COMPONENTS:  [employer] fit femur size 6 with a 36 mm + 1.5 ceramic head ball and a Gription Acetabular shell size 52, with a single cancellous screw for backup fixation, with an apex hole eliminator and a +4 neutral polyethylene liner.    PROCEDURE IN DETAIL:   The patient was met in the holding area and  identified.  The appropriate hip was identified and marked at the operative site.  The patient was then transported to the OR  and  placed under anesthesia.  At that point, the patient was  placed in the lateral decubitus position with the operative side up and  secured to the operating room table and all bony prominences padded.     The operative lower extremity was prepped from the iliac crest to the distal leg.  Sterile draping was performed.  Time out was performed prior to incision.      A routine posterolateral approach was utilized via sharp dissection  carried down to the subcutaneous tissue.  Gross bleeders were Bovie coagulated.  The iliotibial band was identified and incised along the length of the skin incision.  Self-retaining retractors were  inserted.  With the hip internally rotated, the short external rotators  were identified. The piriformis and capsule was tagged with FiberWire, and the hip capsule released in a T-type fashion.  The femoral neck was exposed, and I resected the femoral neck using the appropriate jig. This was performed at approximately a thumb's breadth above the lesser trochanter.    I then exposed the deep acetabulum, cleared  out any tissue including the ligamentum teres.  A wing retractor was placed.  After adequate visualization, I excised the labrum, and then sequentially reamed.  I placed the trial acetabulum, which seated nicely, and then impacted the real cup into place.  Appropriate version and inclination was confirmed clinically matching their bony anatomy, and also with the use of the jig.  I placed a cancellous screw to augment fixation.  A trial polyethylene liner was  placed and the wing retractor removed.    I then prepared the proximal femur using the cookie-cutter, the lateralizing reamer, and then sequentially reamed and broached.  A trial broach, neck, and head was utilized, and I reduced the hip and it was found to have excellent stability with functional range of motion. The trial components were then removed, and the real polyethylene liner was placed.  I then impacted the real femoral prosthesis into place into the appropriate version, slightly anteverted to the normal anatomy, and I impacted the real head ball into place. The hip was then reduced and taken through functional range of motion and found to have excellent stability. Leg lengths were restored.  I then used a 2 mm drill bits to pass the FiberWire suture from the capsule and piriformis through the greater trochanter, and secured this. Excellent posterior capsular repair was achieved. I also closed the T in the capsule.  I then irrigated the hip copiously again with pulse lavage, and repaired the fascia with Vicryl, followed by Vicryl for the subcutaneous tissue, Monocryl for the skin, Steri-Strips and sterile gauze. The wounds were injected. The patient was then awakened and returned to PACU in stable and satisfactory condition. There were no complications.  Marchia Bond, MD Orthopedic Surgeon [phone]   09/24/2021 1:28 PM

## 2021-09-24 NOTE — Brief Op Note (Signed)
09/24/2021  1:37 PM  PATIENT:  Diane [name]  70 y.o. female  PRE-OPERATIVE DIAGNOSIS:  djd right hip  POST-OPERATIVE DIAGNOSIS:  djd right hip  PROCEDURE:  Procedure(s): TOTAL HIP ARTHROPLASTY (Right)  SURGEON:  Surgeon(s) and Role:    Marchia Bond, MD - Primary  PHYSICIAN ASSISTANT: Chriss Czar, PA-C  ASSISTANTS: OR staff x1   ANESTHESIA:   spinal and IV sedation  EBL:  250 mL   BLOOD ADMINISTERED:none  DRAINS: none   LOCAL MEDICATIONS USED:  NONE  SPECIMEN:  No Specimen  DISPOSITION OF SPECIMEN:  N/A  COUNTS:  YES  TOURNIQUET:  * No tourniquets in log *  DICTATION: .Other Dictation: Dictation Number unknown  PLAN OF CARE: Admit for overnight observation  PATIENT DISPOSITION:  PACU - hemodynamically stable.   Delay start of Pharmacological VTE agent (>24hrs) due to surgical blood loss or risk of bleeding: yes

## 2021-09-24 NOTE — Anesthesia Procedure Notes (Addendum)
Spinal  Patient location during procedure: OR Start time: 09/24/2021 11:24 AM End time: 09/24/2021 11:30 AM Reason for block: surgical anesthesia Staffing Performed: anesthesiologist  Anesthesiologist: Nolon Nations, MD Preanesthetic Checklist Completed: patient identified, IV checked, site marked, risks and benefits discussed, surgical consent, monitors and equipment checked, pre-op evaluation and timeout performed Spinal Block Patient position: sitting Prep: DuraPrep and site prepped and draped Patient monitoring: heart rate, continuous pulse ox and blood pressure Approach: right paramedian Location: L2-3 Injection technique: single-shot Needle Needle type: Spinocan  Needle gauge: 25 G Needle length: 9 cm Additional Notes Expiration date of kit checked and confirmed. Patient tolerated procedure well, without complications.

## 2021-09-24 NOTE — Plan of Care (Signed)
  Problem: Activity: Goal: Ability to avoid complications of mobility impairment will improve Outcome: Progressing Goal: Ability to tolerate increased activity will improve Outcome: Progressing   Problem: Pain Management: Goal: Pain level will decrease with appropriate interventions Outcome: Progressing

## 2021-09-24 NOTE — Anesthesia Preprocedure Evaluation (Addendum)
Anesthesia Evaluation  Patient identified by MRN, date of birth, ID band Patient awake    Reviewed: Allergy & Precautions, NPO status , Patient's Chart, lab work & pertinent test results  History of Anesthesia Complications (+) PONV  Airway Mallampati: II  TM Distance: >3 FB Neck ROM: Full    Dental no notable dental hx. (+) Teeth Intact, Dental Advisory Given   Pulmonary neg pulmonary ROS, former smoker,    Pulmonary exam normal breath sounds clear to auscultation       Cardiovascular negative cardio ROS Normal cardiovascular exam Rhythm:Regular Rate:Normal     Neuro/Psych Seizures -, Well Controlled,  negative psych ROS   GI/Hepatic negative GI ROS, Neg liver ROS, Crohn's   Endo/Other  negative endocrine ROS  Renal/GU negative Renal ROS  negative genitourinary   Musculoskeletal  (+) Arthritis ,   Abdominal   Peds  Hematology negative hematology ROS (+)   Anesthesia Other Findings   Reproductive/Obstetrics                            Anesthesia Physical Anesthesia Plan  ASA: 2  Anesthesia Plan: Spinal   Post-op Pain Management:    Induction:   PONV Risk Score and Plan: 3 and Treatment may vary due to age or medical condition, Midazolam, Propofol infusion and Ondansetron  Airway Management Planned: Natural Airway  Additional Equipment:   Intra-op Plan:   Post-operative Plan:   Informed Consent: I have reviewed the patients History and Physical, chart, labs and discussed the procedure including the risks, benefits and alternatives for the proposed anesthesia with the patient or authorized representative who has indicated his/her understanding and acceptance.     Dental advisory given  Plan Discussed with: CRNA  Anesthesia Plan Comments:        Anesthesia Quick Evaluation

## 2021-09-24 NOTE — Transfer of Care (Signed)
Immediate Anesthesia Transfer of Care Note  Patient: Diane [name]  Procedure(s) Performed: TOTAL HIP ARTHROPLASTY (Right: Hip)  Patient Location: PACU  Anesthesia Type:Spinal  Level of Consciousness: awake, alert , oriented and patient cooperative  Airway & Oxygen Therapy: Patient Spontanous Breathing and Patient connected to face mask oxygen  Post-op Assessment: Report given to RN, Post -op Vital signs reviewed and stable and Patient moving all extremities  Post vital signs: Reviewed and stable  Last Vitals:  Vitals Value Taken Time  BP 95/70 09/24/21 1345  Temp    Pulse 70 09/24/21 1348  Resp 12 09/24/21 1348  SpO2 100 % 09/24/21 1348  Vitals shown include unvalidated device data.  Last Pain:  Vitals:   09/24/21 0823  TempSrc:   PainSc: 3       Patients Stated Pain Goal: 2 (11/06/02 5913)  Complications: No notable events documented.

## 2021-09-25 ENCOUNTER — Encounter (HOSPITAL_COMMUNITY): Payer: Self-pay | Admitting: Orthopedic Surgery

## 2021-09-25 DIAGNOSIS — R262 Difficulty in walking, not elsewhere classified: Secondary | ICD-10-CM | POA: Diagnosis not present

## 2021-09-25 DIAGNOSIS — M6281 Muscle weakness (generalized): Secondary | ICD-10-CM | POA: Diagnosis not present

## 2021-09-25 DIAGNOSIS — M1611 Unilateral primary osteoarthritis, right hip: Secondary | ICD-10-CM | POA: Diagnosis not present

## 2021-09-25 DIAGNOSIS — Z87891 Personal history of nicotine dependence: Secondary | ICD-10-CM | POA: Diagnosis not present

## 2021-09-25 DIAGNOSIS — Z20822 Contact with and (suspected) exposure to covid-19: Secondary | ICD-10-CM | POA: Diagnosis not present

## 2021-09-25 NOTE — Plan of Care (Signed)
  Problem: Education: Goal: Knowledge of the prescribed therapeutic regimen will improve Outcome: Progressing Goal: Understanding of discharge needs will improve Outcome: Progressing   Problem: Pain Management: Goal: Pain level will decrease with appropriate interventions Outcome: Progressing

## 2021-09-25 NOTE — Progress Notes (Signed)
   ORTHOPAEDIC PROGRESS NOTE  s/p Procedure(s): TOTAL HIP ARTHROPLASTY  SUBJECTIVE: Reports mild pain about operative site. Pain has improved this morning compared to last night. No chest pain. No SOB. No nausea/vomiting. No other complaints.  OBJECTIVE: PE: General: sitting up in hospital bed, NAD Pulmonary: no increased work of breathing Cardiac: regular rate RLE: dressing CDI, intact EHL/TA/GSC, endorses distal sensation, warm well perfused foot   Vitals:   09/25/21 0122 09/25/21 0558  BP: (!) 105/58 103/62  Pulse: 95 92  Resp: 16 16  Temp: 98.9 F (37.2 C) 98.8 F (37.1 C)  SpO2: 90% 92%    ASSESSMENT: Diane [name] is a 70 y.o. female doing well postoperatively. PDO#1  PLAN: Weightbearing: WBAT RLE Insicional and dressing care: Reinforce dressings as needed Orthopedic device(s): Posterior hip precautions Showering: Post-op day #2 with assitance VTE prophylaxis: Xarelto 10 mg Pain control: PRN pain medications Follow - up plan: 2 weeks in office Contact information: After hours and holidays please check [email or web address] for group call information for [facility]  Plan for patient to be discharged home if she passes physical therapy. Home health PT has been set up and patient is scheduled to be seen tomorrow.  Noemi Chapel, PA-C 09/25/2021

## 2021-09-25 NOTE — Plan of Care (Signed)
  Problem: Activity: Goal: Ability to avoid complications of mobility impairment will improve Outcome: Progressing Goal: Ability to tolerate increased activity will improve Outcome: Progressing   Problem: Pain Management: Goal: Pain level will decrease with appropriate interventions Outcome: Progressing

## 2021-09-25 NOTE — Progress Notes (Signed)
Physical Therapy Treatment Patient Details Name: Diane [name] MRN: [id number] DOB: 1951-03-21 Today's Date: 09/25/2021   History of Present Illness 70 y.o. female admitted 09/24/21 for R posterior THA. PMH includes SBO, SUI, pelvic prolapse, Crohn's disease, OA, osteoporosis.    PT Comments    Pt is progressing well with mobility and is ready to DC home from PT standpoint. Stair training completed, pt/spouse demonstrate good understanding of HEP and of posterior precautions.    Recommendations for follow up therapy are one component of a multi-disciplinary discharge planning process, led by the attending physician.  Recommendations may be updated based on patient status, additional functional criteria and insurance authorization.  Follow Up Recommendations  Follow physician's recommendations for discharge plan and follow up therapies     Assistance Recommended at Discharge Intermittent Supervision/Assistance  Equipment Recommendations  None recommended by PT    Recommendations for Other Services       Precautions / Restrictions Precautions Precautions: Posterior Hip Precaution Booklet Issued: Yes (comment) Precaution Comments: reviewed precautions with pt/spouse Restrictions Weight Bearing Restrictions: No Other Position/Activity Restrictions: WBAT     Mobility  Bed Mobility Overal bed mobility: Needs Assistance Bed Mobility: Supine to Sit     Supine to sit: Mod assist Sit to supine: Mod assist   General bed mobility comments: mod A to raise trunk, pivot hips, support RLE    Transfers Overall transfer level: Needs assistance Equipment used: Rolling walker (2 wheels) Transfers: Sit to/from Stand;Bed to chair/wheelchair/BSC Sit to Stand: From elevated surface;Supervision Stand pivot transfers: Min assist         General transfer comment: VCs hand placement and for precautions    Ambulation/Gait Ambulation/Gait assistance: Min guard Gait Distance (Feet):  110 Feet Assistive device: Rolling walker (2 wheels) Gait Pattern/deviations: Step-to pattern;Trunk flexed Gait velocity: decr     General Gait Details: VCs sequencing and posture, no loss of balance, distance limited by fatigue   Stairs Stairs: Yes Stairs assistance: Min assist Stair Management: No rails;Step to pattern;Backwards;With walker Number of Stairs: 3 General stair comments: min A to manage RW, VCs sequencing   Wheelchair Mobility    Modified Rankin (Stroke Patients Only)       Balance Overall balance assessment: Needs assistance Sitting-balance support: Feet supported;No upper extremity supported Sitting balance-Leahy Scale: Good     Standing balance support: Bilateral upper extremity supported;Reliant on assistive device for balance Standing balance-Leahy Scale: Poor                              Cognition Arousal/Alertness: Awake/alert Behavior During Therapy: WFL for tasks assessed/performed Overall Cognitive Status: Within Functional Limits for tasks assessed                                          Exercises Total Joint Exercises Ankle Circles/Pumps: AROM;Both;10 reps;Supine Quad Sets: AROM;Right;5 reps;Supine Short Arc Quad: AROM;Right;5 reps;Supine Heel Slides: AAROM;Right;Supine;5 reps Hip ABduction/ADduction: AAROM;Right;Supine;5 reps Long Arc Quad: AROM;Right;10 reps;Seated    General Comments        Pertinent Vitals/Pain Pain Assessment: 0-10 Pain Score: 3  Pain Location: R hip Pain Descriptors / Indicators: Sore;Operative site guarding Pain Intervention(s): Limited activity within patient's tolerance;Monitored during session;Premedicated before session;Ice applied    Home Living  Prior Function            PT Goals (current goals can now be found in the care plan section) Acute Rehab PT Goals Patient Stated Goal: walk without pain PT Goal Formulation: With  patient/family Time For Goal Achievement: 10/02/21 Potential to Achieve Goals: Good Progress towards PT goals: Progressing toward goals    Frequency    7X/week      PT Plan Current plan remains appropriate    Co-evaluation              AM-PAC PT "6 Clicks" Mobility   Outcome Measure  Help needed turning from your back to your side while in a flat bed without using bedrails?: A Little Help needed moving from lying on your back to sitting on the side of a flat bed without using bedrails?: A Lot Help needed moving to and from a bed to a chair (including a wheelchair)?: A Little Help needed standing up from a chair using your arms (e.g., wheelchair or bedside chair)?: A Little Help needed to walk in hospital room?: A Little Help needed climbing 3-5 steps with a railing? : A Little 6 Click Score: 17    End of Session Equipment Utilized During Treatment: Gait belt Activity Tolerance: Patient tolerated treatment well Patient left: with call bell/phone within reach;in chair;with chair alarm set;with family/visitor present Nurse Communication: Mobility status PT Visit Diagnosis: Difficulty in walking, not elsewhere classified (R26.2);Muscle weakness (generalized) (M62.81);Pain Pain - Right/Left: Right Pain - part of body: Hip     Time: 9381-8299 PT Time Calculation (min) (ACUTE ONLY): 41 min  Charges:  $Gait Training: 8-22 mins $Therapeutic Exercise: 8-22 mins $Therapeutic Activity: 8-22 mins                     Blondell Reveal Kistler PT 09/25/2021  Acute Rehabilitation Services Pager [phone] Office [phone]

## 2021-09-25 NOTE — Evaluation (Signed)
Physical Therapy Evaluation Patient Details Name: Diane [name] MRN: [id number] DOB: December 09, 1950 Today's Date: 09/25/2021  History of Present Illness  70 y.o. female admitted 09/24/21 for R posterior THA. PMH includes SBO, SUI, pelvic prolapse, Crohn's disease, OA, osteoporosis.  Clinical Impression  Pt is s/p THA resulting in the deficits listed below (see PT Problem List). Assisted pt to transfer bed to 3 in 1 and back to bed. Ambulation deferred as pt felt light headed. Will attempt ambulation later this morning. Instructed pt in posterior hip precautions.  Pt will benefit from skilled PT to increase their independence and safety with mobility to allow discharge to the venue listed below.         Recommendations for follow up therapy are one component of a multi-disciplinary discharge planning process, led by the attending physician.  Recommendations may be updated based on patient status, additional functional criteria and insurance authorization.  Follow Up Recommendations Follow physician's recommendations for discharge plan and follow up therapies    Assistance Recommended at Discharge Intermittent Supervision/Assistance  Functional Status Assessment Patient has had a recent decline in their functional status and demonstrates the ability to make significant improvements in function in a reasonable and predictable amount of time.  Equipment Recommendations  None recommended by PT    Recommendations for Other Services       Precautions / Restrictions Precautions Precautions: Posterior Hip Precaution Booklet Issued: Yes (comment) Precaution Comments: instructed pt/spouse in precautions, handout issued Restrictions Weight Bearing Restrictions: No Other Position/Activity Restrictions: WBAT      Mobility  Bed Mobility Overal bed mobility: Needs Assistance Bed Mobility: Supine to Sit;Sit to Supine     Supine to sit: Mod assist Sit to supine: Mod assist   General bed  mobility comments: mod A to raise trunk, pivot hips, support RLE; mod A for RLE into bed    Transfers Overall transfer level: Needs assistance Equipment used: Rolling walker (2 wheels) Transfers: Sit to/from Stand;Bed to chair/wheelchair/BSC Sit to Stand: Min assist;From elevated surface Stand pivot transfers: Min assist         General transfer comment: VCs hand placement and for precautions, min A to power up, VCs sequencing to pivot with RW to 3 in 1 then to bed    Ambulation/Gait                  Stairs            Wheelchair Mobility    Modified Rankin (Stroke Patients Only)       Balance Overall balance assessment: Needs assistance Sitting-balance support: Feet supported;No upper extremity supported Sitting balance-Leahy Scale: Good     Standing balance support: Bilateral upper extremity supported;Reliant on assistive device for balance Standing balance-Leahy Scale: Poor                               Pertinent Vitals/Pain Pain Assessment: 0-10 Pain Score: 3  Pain Location: R hip Pain Descriptors / Indicators: Sore;Operative site guarding Pain Intervention(s): Limited activity within patient's tolerance;Monitored during session;Premedicated before session;Ice applied    Home Living Family/patient expects to be discharged to:: Private residence Living Arrangements: Spouse/significant other Available Help at Discharge: Family;Available 24 hours/day Type of Home: House Home Access: Stairs to enter Entrance Stairs-Rails: None Entrance Stairs-Number of Steps: 3   Home Layout: One level Home Equipment: Conservation officer, nature (2 wheels);BSC/3in1      Prior Function Prior Level of Function : Independent/Modified  Independent                     Hand Dominance        Extremity/Trunk Assessment   Upper Extremity Assessment Upper Extremity Assessment: Overall WFL for tasks assessed    Lower Extremity Assessment Lower Extremity  Assessment: RLE deficits/detail RLE Deficits / Details: hip 2/5, hip flexion ~35* AAROM, hip ABDuction ~12* AAROM, knee ext -3/5 RLE Sensation: WNL    Cervical / Trunk Assessment Cervical / Trunk Assessment: Normal  Communication   Communication: No difficulties  Cognition Arousal/Alertness: Awake/alert Behavior During Therapy: WFL for tasks assessed/performed Overall Cognitive Status: Within Functional Limits for tasks assessed                                          General Comments      Exercises     Assessment/Plan    PT Assessment Patient needs continued PT services  PT Problem List Decreased mobility;Decreased range of motion;Decreased activity tolerance;Decreased balance;Decreased knowledge of use of DME;Pain       PT Treatment Interventions Therapeutic activities;Therapeutic exercise;Gait training;Functional mobility training;Balance training;Stair training;Patient/family education    PT Goals (Current goals can be found in the Care Plan section)  Acute Rehab PT Goals Patient Stated Goal: walk without pain PT Goal Formulation: With patient/family Time For Goal Achievement: 10/02/21 Potential to Achieve Goals: Good    Frequency 7X/week   Barriers to discharge        Co-evaluation               AM-PAC PT "6 Clicks" Mobility  Outcome Measure Help needed turning from your back to your side while in a flat bed without using bedrails?: A Lot Help needed moving from lying on your back to sitting on the side of a flat bed without using bedrails?: A Lot Help needed moving to and from a bed to a chair (including a wheelchair)?: A Little Help needed standing up from a chair using your arms (e.g., wheelchair or bedside chair)?: A Little Help needed to walk in hospital room?: A Lot Help needed climbing 3-5 steps with a railing? : A Lot 6 Click Score: 14    End of Session Equipment Utilized During Treatment: Gait belt Activity Tolerance:  Patient limited by fatigue;Patient limited by pain Patient left: in bed;with call bell/phone within reach;with nursing/sitter in room;with bed alarm set Nurse Communication: Mobility status PT Visit Diagnosis: Difficulty in walking, not elsewhere classified (R26.2);Muscle weakness (generalized) (M62.81);Pain Pain - Right/Left: Right Pain - part of body: Hip    Time: 7124-5809 PT Time Calculation (min) (ACUTE ONLY): 18 min   Charges:   PT Evaluation $PT Eval Moderate Complexity: 1 Mod         Philomena Doheny PT 09/25/2021  Acute Rehabilitation Services Pager [phone] Office [phone]

## 2021-09-25 NOTE — Progress Notes (Signed)
Discharge package printed and instructions given to pt and husband. Verbalize understanding.

## 2021-09-25 NOTE — TOC Transition Note (Signed)
Transition of Care [facility]) - CM/SW Discharge Note   Patient Details  Name: Diane [name] MRN: [id number] Date of Birth: August 24, 1951  [facility] of [facility]) CM/SW Contact:  Lennart Pall, LCSW Phone Number: 09/25/2021, 11:18 AM   Clinical Narrative:    Met with pt and spouse this morning and pt reporting she does NOT have DME already at home.  Alerted Ortho bundle CM, Renee [place], who will send orders to [facility]. [employer] has delivered DME to pt's room.  Pt aware she has been prearranged with [facility] for HHPT.  No further TOC needs.   Final next level of care: [place] Barriers to Discharge: No Barriers Identified   Patient Goals and CMS Choice Patient states their goals for this hospitalization and ongoing recovery are:: return home      Discharge Placement                       Discharge Plan and Services                DME Arranged: 3-N-1, Walker rolling DME Agency: [employer] Date DME Agency Contacted: 09/25/21 Time DME Agency Contacted: [id number] Representative spoke with at DME Agency: Ovid Curd HH Arranged: PT [place]: [place] Date [place]:  (prearranged via MD office)      Social Determinants of Health (SDOH) Interventions     Readmission Risk Interventions No flowsheet data found.

## 2021-09-25 NOTE — Progress Notes (Signed)
Physical Therapy Treatment Patient Details Name: Diane [name] MRN: [id number] DOB: 1951/01/20 Today's Date: 09/25/2021   History of Present Illness 70 y.o. female admitted 09/24/21 for R posterior THA. PMH includes SBO, SUI, pelvic prolapse, Crohn's disease, OA, osteoporosis.    PT Comments    Pt ambulated 41' with RW, distance limited by fatigue. Reviewed posterior hip precautions with pt/spouse. Initiated THA HEP. Will plan to do stair training this afternoon, then I expect she'll be ready to DC home.     Recommendations for follow up therapy are one component of a multi-disciplinary discharge planning process, led by the attending physician.  Recommendations may be updated based on patient status, additional functional criteria and insurance authorization.  Follow Up Recommendations  Follow physician's recommendations for discharge plan and follow up therapies     Assistance Recommended at Discharge Intermittent Supervision/Assistance  Equipment Recommendations  None recommended by PT    Recommendations for Other Services       Precautions / Restrictions Precautions Precautions: Posterior Hip Precaution Booklet Issued: Yes (comment) Precaution Comments: reviewed precautions with pt/spouse Restrictions Weight Bearing Restrictions: No Other Position/Activity Restrictions: WBAT     Mobility  Bed Mobility Overal bed mobility: Needs Assistance Bed Mobility: Supine to Sit     Supine to sit: Mod assist Sit to supine: Mod assist   General bed mobility comments: mod A to raise trunk, pivot hips, support RLE    Transfers Overall transfer level: Needs assistance Equipment used: Rolling walker (2 wheels) Transfers: Sit to/from Stand;Bed to chair/wheelchair/BSC Sit to Stand: Min assist;From elevated surface Stand pivot transfers: Min assist         General transfer comment: VCs hand placement and for precautions, min A to power up     Ambulation/Gait Ambulation/Gait assistance: Min guard Gait Distance (Feet): 65 Feet Assistive device: Rolling walker (2 wheels) Gait Pattern/deviations: Step-to pattern;Trunk flexed Gait velocity: decr     General Gait Details: VCs sequencing and posture, no loss of balance, distance limited by fatigue   Stairs             Wheelchair Mobility    Modified Rankin (Stroke Patients Only)       Balance Overall balance assessment: Needs assistance Sitting-balance support: Feet supported;No upper extremity supported Sitting balance-Leahy Scale: Good     Standing balance support: Bilateral upper extremity supported;Reliant on assistive device for balance Standing balance-Leahy Scale: Poor                              Cognition Arousal/Alertness: Awake/alert Behavior During Therapy: WFL for tasks assessed/performed Overall Cognitive Status: Within Functional Limits for tasks assessed                                          Exercises Total Joint Exercises Ankle Circles/Pumps: AROM;Both;10 reps;Supine Heel Slides: AAROM;Right;10 reps;Supine Hip ABduction/ADduction: AAROM;Right;10 reps;Supine Long Arc Quad: AROM;Right;10 reps;Seated    General Comments        Pertinent Vitals/Pain Pain Assessment: 0-10 Pain Score: 3  Pain Location: R hip Pain Descriptors / Indicators: Sore;Operative site guarding Pain Intervention(s): Limited activity within patient's tolerance;Monitored during session;Premedicated before session;Ice applied    Home Living Family/patient expects to be discharged to:: Private residence Living Arrangements: Spouse/significant other Available Help at Discharge: Family;Available 24 hours/day Type of Home: House Home Access: Stairs to enter Entrance  Stairs-Rails: None Entrance Stairs-Number of Steps: 3   Home Layout: One level Home Equipment: Conservation officer, nature (2 wheels);BSC/3in1      Prior Function             PT Goals (current goals can now be found in the care plan section) Acute Rehab PT Goals Patient Stated Goal: walk without pain PT Goal Formulation: With patient/family Time For Goal Achievement: 10/02/21 Potential to Achieve Goals: Good Progress towards PT goals: Progressing toward goals    Frequency    7X/week      PT Plan Current plan remains appropriate    Co-evaluation              AM-PAC PT "6 Clicks" Mobility   Outcome Measure  Help needed turning from your back to your side while in a flat bed without using bedrails?: A Lot Help needed moving from lying on your back to sitting on the side of a flat bed without using bedrails?: A Lot Help needed moving to and from a bed to a chair (including a wheelchair)?: A Little Help needed standing up from a chair using your arms (e.g., wheelchair or bedside chair)?: A Little Help needed to walk in hospital room?: A Little Help needed climbing 3-5 steps with a railing? : A Lot 6 Click Score: 15    End of Session Equipment Utilized During Treatment: Gait belt Activity Tolerance: Patient limited by fatigue;Patient limited by pain Patient left: with call bell/phone within reach;in chair;with chair alarm set;with family/visitor present Nurse Communication: Mobility status PT Visit Diagnosis: Difficulty in walking, not elsewhere classified (R26.2);Muscle weakness (generalized) (M62.81);Pain Pain - Right/Left: Right Pain - part of body: Hip     Time: 2111-5520 PT Time Calculation (min) (ACUTE ONLY): 40 min  Charges:  $Gait Training: 8-22 mins $Therapeutic Exercise: 8-22 mins $Therapeutic Activity: 8-22 mins                     Blondell Reveal Kistler PT 09/25/2021  Acute Rehabilitation Services Pager [phone] Office [phone]

## 2021-09-26 DIAGNOSIS — Z87891 Personal history of nicotine dependence: Secondary | ICD-10-CM | POA: Diagnosis not present

## 2021-09-26 DIAGNOSIS — M810 Age-related osteoporosis without current pathological fracture: Secondary | ICD-10-CM | POA: Diagnosis not present

## 2021-09-26 DIAGNOSIS — R569 Unspecified convulsions: Secondary | ICD-10-CM | POA: Diagnosis not present

## 2021-09-26 DIAGNOSIS — K5090 Crohn's disease, unspecified, without complications: Secondary | ICD-10-CM | POA: Diagnosis not present

## 2021-09-26 DIAGNOSIS — Z96641 Presence of right artificial hip joint: Secondary | ICD-10-CM | POA: Diagnosis not present

## 2021-09-26 DIAGNOSIS — Z471 Aftercare following joint replacement surgery: Secondary | ICD-10-CM | POA: Diagnosis not present

## 2021-09-26 DIAGNOSIS — Z7901 Long term (current) use of anticoagulants: Secondary | ICD-10-CM | POA: Diagnosis not present

## 2021-09-27 DIAGNOSIS — Z7901 Long term (current) use of anticoagulants: Secondary | ICD-10-CM | POA: Diagnosis not present

## 2021-09-27 DIAGNOSIS — M810 Age-related osteoporosis without current pathological fracture: Secondary | ICD-10-CM | POA: Diagnosis not present

## 2021-09-27 DIAGNOSIS — Z471 Aftercare following joint replacement surgery: Secondary | ICD-10-CM | POA: Diagnosis not present

## 2021-09-27 DIAGNOSIS — Z87891 Personal history of nicotine dependence: Secondary | ICD-10-CM | POA: Diagnosis not present

## 2021-09-27 DIAGNOSIS — Z96641 Presence of right artificial hip joint: Secondary | ICD-10-CM | POA: Diagnosis not present

## 2021-09-27 DIAGNOSIS — K5090 Crohn's disease, unspecified, without complications: Secondary | ICD-10-CM | POA: Diagnosis not present

## 2021-09-27 DIAGNOSIS — R569 Unspecified convulsions: Secondary | ICD-10-CM | POA: Diagnosis not present

## 2021-09-27 NOTE — Anesthesia Postprocedure Evaluation (Signed)
Anesthesia Post Note  Patient: Diane [name]  Procedure(s) Performed: TOTAL HIP ARTHROPLASTY (Right: Hip)     Anesthesia Post Evaluation No notable events documented.  Last Vitals:  Vitals:   09/25/21 0122 09/25/21 0558  BP: (!) 105/58 103/62  Pulse: 95 92  Resp: 16 16  Temp: 37.2 C 37.1 C  SpO2: 90% 92%    Last Pain:  Vitals:   09/25/21 1257  TempSrc:   PainSc: 0-No pain                 Nolon Nations

## 2021-09-30 DIAGNOSIS — Z7901 Long term (current) use of anticoagulants: Secondary | ICD-10-CM | POA: Diagnosis not present

## 2021-09-30 DIAGNOSIS — M810 Age-related osteoporosis without current pathological fracture: Secondary | ICD-10-CM | POA: Diagnosis not present

## 2021-09-30 DIAGNOSIS — K5090 Crohn's disease, unspecified, without complications: Secondary | ICD-10-CM | POA: Diagnosis not present

## 2021-09-30 DIAGNOSIS — Z96641 Presence of right artificial hip joint: Secondary | ICD-10-CM | POA: Diagnosis not present

## 2021-09-30 DIAGNOSIS — R569 Unspecified convulsions: Secondary | ICD-10-CM | POA: Diagnosis not present

## 2021-09-30 DIAGNOSIS — Z87891 Personal history of nicotine dependence: Secondary | ICD-10-CM | POA: Diagnosis not present

## 2021-09-30 DIAGNOSIS — Z471 Aftercare following joint replacement surgery: Secondary | ICD-10-CM | POA: Diagnosis not present

## 2021-10-01 DIAGNOSIS — R569 Unspecified convulsions: Secondary | ICD-10-CM | POA: Diagnosis not present

## 2021-10-01 DIAGNOSIS — K5090 Crohn's disease, unspecified, without complications: Secondary | ICD-10-CM | POA: Diagnosis not present

## 2021-10-01 DIAGNOSIS — Z471 Aftercare following joint replacement surgery: Secondary | ICD-10-CM | POA: Diagnosis not present

## 2021-10-01 DIAGNOSIS — Z7901 Long term (current) use of anticoagulants: Secondary | ICD-10-CM | POA: Diagnosis not present

## 2021-10-01 DIAGNOSIS — M810 Age-related osteoporosis without current pathological fracture: Secondary | ICD-10-CM | POA: Diagnosis not present

## 2021-10-01 DIAGNOSIS — Z87891 Personal history of nicotine dependence: Secondary | ICD-10-CM | POA: Diagnosis not present

## 2021-10-01 DIAGNOSIS — Z96641 Presence of right artificial hip joint: Secondary | ICD-10-CM | POA: Diagnosis not present

## 2021-10-04 DIAGNOSIS — Z7901 Long term (current) use of anticoagulants: Secondary | ICD-10-CM | POA: Diagnosis not present

## 2021-10-04 DIAGNOSIS — Z471 Aftercare following joint replacement surgery: Secondary | ICD-10-CM | POA: Diagnosis not present

## 2021-10-04 DIAGNOSIS — R569 Unspecified convulsions: Secondary | ICD-10-CM | POA: Diagnosis not present

## 2021-10-04 DIAGNOSIS — Z87891 Personal history of nicotine dependence: Secondary | ICD-10-CM | POA: Diagnosis not present

## 2021-10-04 DIAGNOSIS — M810 Age-related osteoporosis without current pathological fracture: Secondary | ICD-10-CM | POA: Diagnosis not present

## 2021-10-04 DIAGNOSIS — Z96641 Presence of right artificial hip joint: Secondary | ICD-10-CM | POA: Diagnosis not present

## 2021-10-04 DIAGNOSIS — K5090 Crohn's disease, unspecified, without complications: Secondary | ICD-10-CM | POA: Diagnosis not present

## 2021-10-05 DIAGNOSIS — K5090 Crohn's disease, unspecified, without complications: Secondary | ICD-10-CM | POA: Diagnosis not present

## 2021-10-05 DIAGNOSIS — M810 Age-related osteoporosis without current pathological fracture: Secondary | ICD-10-CM | POA: Diagnosis not present

## 2021-10-05 DIAGNOSIS — Z7901 Long term (current) use of anticoagulants: Secondary | ICD-10-CM | POA: Diagnosis not present

## 2021-10-05 DIAGNOSIS — Z87891 Personal history of nicotine dependence: Secondary | ICD-10-CM | POA: Diagnosis not present

## 2021-10-05 DIAGNOSIS — Z471 Aftercare following joint replacement surgery: Secondary | ICD-10-CM | POA: Diagnosis not present

## 2021-10-05 DIAGNOSIS — Z96641 Presence of right artificial hip joint: Secondary | ICD-10-CM | POA: Diagnosis not present

## 2021-10-05 DIAGNOSIS — R569 Unspecified convulsions: Secondary | ICD-10-CM | POA: Diagnosis not present

## 2021-10-07 DIAGNOSIS — R262 Difficulty in walking, not elsewhere classified: Secondary | ICD-10-CM | POA: Diagnosis not present

## 2021-10-07 DIAGNOSIS — M1611 Unilateral primary osteoarthritis, right hip: Secondary | ICD-10-CM | POA: Diagnosis not present

## 2021-10-07 DIAGNOSIS — M25551 Pain in right hip: Secondary | ICD-10-CM | POA: Diagnosis not present

## 2021-10-07 DIAGNOSIS — Z96641 Presence of right artificial hip joint: Secondary | ICD-10-CM | POA: Diagnosis not present

## 2021-10-07 DIAGNOSIS — M6281 Muscle weakness (generalized): Secondary | ICD-10-CM | POA: Diagnosis not present

## 2021-10-10 DIAGNOSIS — M1611 Unilateral primary osteoarthritis, right hip: Secondary | ICD-10-CM | POA: Diagnosis not present

## 2021-10-10 DIAGNOSIS — Z96641 Presence of right artificial hip joint: Secondary | ICD-10-CM | POA: Diagnosis not present

## 2021-10-10 DIAGNOSIS — R262 Difficulty in walking, not elsewhere classified: Secondary | ICD-10-CM | POA: Diagnosis not present

## 2021-10-10 DIAGNOSIS — M25551 Pain in right hip: Secondary | ICD-10-CM | POA: Diagnosis not present

## 2021-10-10 DIAGNOSIS — M6281 Muscle weakness (generalized): Secondary | ICD-10-CM | POA: Diagnosis not present

## 2021-10-15 DIAGNOSIS — M1611 Unilateral primary osteoarthritis, right hip: Secondary | ICD-10-CM | POA: Diagnosis not present

## 2021-10-16 DIAGNOSIS — M1611 Unilateral primary osteoarthritis, right hip: Secondary | ICD-10-CM | POA: Diagnosis not present

## 2021-10-16 DIAGNOSIS — M25551 Pain in right hip: Secondary | ICD-10-CM | POA: Diagnosis not present

## 2021-10-16 DIAGNOSIS — M6281 Muscle weakness (generalized): Secondary | ICD-10-CM | POA: Diagnosis not present

## 2021-10-16 DIAGNOSIS — Z96641 Presence of right artificial hip joint: Secondary | ICD-10-CM | POA: Diagnosis not present

## 2021-10-16 DIAGNOSIS — R262 Difficulty in walking, not elsewhere classified: Secondary | ICD-10-CM | POA: Diagnosis not present

## 2021-10-18 DIAGNOSIS — M1611 Unilateral primary osteoarthritis, right hip: Secondary | ICD-10-CM | POA: Diagnosis not present

## 2021-10-18 DIAGNOSIS — R262 Difficulty in walking, not elsewhere classified: Secondary | ICD-10-CM | POA: Diagnosis not present

## 2021-10-18 DIAGNOSIS — M6281 Muscle weakness (generalized): Secondary | ICD-10-CM | POA: Diagnosis not present

## 2021-10-18 DIAGNOSIS — Z96641 Presence of right artificial hip joint: Secondary | ICD-10-CM | POA: Diagnosis not present

## 2021-10-18 DIAGNOSIS — M25551 Pain in right hip: Secondary | ICD-10-CM | POA: Diagnosis not present

## 2021-10-21 DIAGNOSIS — R262 Difficulty in walking, not elsewhere classified: Secondary | ICD-10-CM | POA: Diagnosis not present

## 2021-10-21 DIAGNOSIS — M6281 Muscle weakness (generalized): Secondary | ICD-10-CM | POA: Diagnosis not present

## 2021-10-21 DIAGNOSIS — Z96641 Presence of right artificial hip joint: Secondary | ICD-10-CM | POA: Diagnosis not present

## 2021-10-21 DIAGNOSIS — M1611 Unilateral primary osteoarthritis, right hip: Secondary | ICD-10-CM | POA: Diagnosis not present

## 2021-10-21 DIAGNOSIS — M25551 Pain in right hip: Secondary | ICD-10-CM | POA: Diagnosis not present

## 2021-10-23 DIAGNOSIS — M6281 Muscle weakness (generalized): Secondary | ICD-10-CM | POA: Diagnosis not present

## 2021-10-23 DIAGNOSIS — Z96641 Presence of right artificial hip joint: Secondary | ICD-10-CM | POA: Diagnosis not present

## 2021-10-23 DIAGNOSIS — R262 Difficulty in walking, not elsewhere classified: Secondary | ICD-10-CM | POA: Diagnosis not present

## 2021-10-23 DIAGNOSIS — M1611 Unilateral primary osteoarthritis, right hip: Secondary | ICD-10-CM | POA: Diagnosis not present

## 2021-10-23 DIAGNOSIS — M25551 Pain in right hip: Secondary | ICD-10-CM | POA: Diagnosis not present

## 2021-10-31 DIAGNOSIS — M1611 Unilateral primary osteoarthritis, right hip: Secondary | ICD-10-CM | POA: Diagnosis not present

## 2021-10-31 DIAGNOSIS — M25551 Pain in right hip: Secondary | ICD-10-CM | POA: Diagnosis not present

## 2021-10-31 DIAGNOSIS — Z96641 Presence of right artificial hip joint: Secondary | ICD-10-CM | POA: Diagnosis not present

## 2021-10-31 DIAGNOSIS — R262 Difficulty in walking, not elsewhere classified: Secondary | ICD-10-CM | POA: Diagnosis not present

## 2021-10-31 DIAGNOSIS — M6281 Muscle weakness (generalized): Secondary | ICD-10-CM | POA: Diagnosis not present

## 2021-11-06 DIAGNOSIS — M1611 Unilateral primary osteoarthritis, right hip: Secondary | ICD-10-CM | POA: Diagnosis not present

## 2021-11-07 DIAGNOSIS — M1611 Unilateral primary osteoarthritis, right hip: Secondary | ICD-10-CM | POA: Diagnosis not present

## 2021-11-07 DIAGNOSIS — M25551 Pain in right hip: Secondary | ICD-10-CM | POA: Diagnosis not present

## 2021-11-07 DIAGNOSIS — M6281 Muscle weakness (generalized): Secondary | ICD-10-CM | POA: Diagnosis not present

## 2021-11-07 DIAGNOSIS — Z96641 Presence of right artificial hip joint: Secondary | ICD-10-CM | POA: Diagnosis not present

## 2021-11-07 DIAGNOSIS — R262 Difficulty in walking, not elsewhere classified: Secondary | ICD-10-CM | POA: Diagnosis not present

## 2021-11-11 DIAGNOSIS — Z96641 Presence of right artificial hip joint: Secondary | ICD-10-CM | POA: Diagnosis not present

## 2021-11-11 DIAGNOSIS — M1611 Unilateral primary osteoarthritis, right hip: Secondary | ICD-10-CM | POA: Diagnosis not present

## 2021-11-11 DIAGNOSIS — R262 Difficulty in walking, not elsewhere classified: Secondary | ICD-10-CM | POA: Diagnosis not present

## 2021-11-11 DIAGNOSIS — M25551 Pain in right hip: Secondary | ICD-10-CM | POA: Diagnosis not present

## 2021-11-11 DIAGNOSIS — M6281 Muscle weakness (generalized): Secondary | ICD-10-CM | POA: Diagnosis not present

## 2021-11-13 DIAGNOSIS — M1611 Unilateral primary osteoarthritis, right hip: Secondary | ICD-10-CM | POA: Diagnosis not present

## 2021-11-13 DIAGNOSIS — M25551 Pain in right hip: Secondary | ICD-10-CM | POA: Diagnosis not present

## 2021-11-13 DIAGNOSIS — Z96641 Presence of right artificial hip joint: Secondary | ICD-10-CM | POA: Diagnosis not present

## 2021-11-13 DIAGNOSIS — M6281 Muscle weakness (generalized): Secondary | ICD-10-CM | POA: Diagnosis not present

## 2021-11-13 DIAGNOSIS — R262 Difficulty in walking, not elsewhere classified: Secondary | ICD-10-CM | POA: Diagnosis not present

## 2021-11-18 DIAGNOSIS — M1611 Unilateral primary osteoarthritis, right hip: Secondary | ICD-10-CM | POA: Diagnosis not present

## 2021-11-18 DIAGNOSIS — Z96641 Presence of right artificial hip joint: Secondary | ICD-10-CM | POA: Diagnosis not present

## 2021-11-18 DIAGNOSIS — M25551 Pain in right hip: Secondary | ICD-10-CM | POA: Diagnosis not present

## 2021-11-18 DIAGNOSIS — M6281 Muscle weakness (generalized): Secondary | ICD-10-CM | POA: Diagnosis not present

## 2021-11-18 DIAGNOSIS — R262 Difficulty in walking, not elsewhere classified: Secondary | ICD-10-CM | POA: Diagnosis not present

## 2021-11-21 DIAGNOSIS — M25551 Pain in right hip: Secondary | ICD-10-CM | POA: Diagnosis not present

## 2021-11-21 DIAGNOSIS — Z96641 Presence of right artificial hip joint: Secondary | ICD-10-CM | POA: Diagnosis not present

## 2021-11-21 DIAGNOSIS — R262 Difficulty in walking, not elsewhere classified: Secondary | ICD-10-CM | POA: Diagnosis not present

## 2021-11-21 DIAGNOSIS — M1611 Unilateral primary osteoarthritis, right hip: Secondary | ICD-10-CM | POA: Diagnosis not present

## 2021-11-21 DIAGNOSIS — M6281 Muscle weakness (generalized): Secondary | ICD-10-CM | POA: Diagnosis not present

## 2021-11-25 DIAGNOSIS — M1611 Unilateral primary osteoarthritis, right hip: Secondary | ICD-10-CM | POA: Diagnosis not present

## 2021-11-25 DIAGNOSIS — M25551 Pain in right hip: Secondary | ICD-10-CM | POA: Diagnosis not present

## 2021-11-25 DIAGNOSIS — Z96641 Presence of right artificial hip joint: Secondary | ICD-10-CM | POA: Diagnosis not present

## 2021-11-25 DIAGNOSIS — R262 Difficulty in walking, not elsewhere classified: Secondary | ICD-10-CM | POA: Diagnosis not present

## 2021-11-25 DIAGNOSIS — M6281 Muscle weakness (generalized): Secondary | ICD-10-CM | POA: Diagnosis not present

## 2021-11-28 DIAGNOSIS — M25551 Pain in right hip: Secondary | ICD-10-CM | POA: Diagnosis not present

## 2021-11-28 DIAGNOSIS — M1611 Unilateral primary osteoarthritis, right hip: Secondary | ICD-10-CM | POA: Diagnosis not present

## 2021-11-28 DIAGNOSIS — R262 Difficulty in walking, not elsewhere classified: Secondary | ICD-10-CM | POA: Diagnosis not present

## 2021-11-28 DIAGNOSIS — Z96641 Presence of right artificial hip joint: Secondary | ICD-10-CM | POA: Diagnosis not present

## 2021-11-28 DIAGNOSIS — M6281 Muscle weakness (generalized): Secondary | ICD-10-CM | POA: Diagnosis not present

## 2022-01-07 ENCOUNTER — Ambulatory Visit
Admission: RE | Admit: 2022-01-07 | Discharge: 2022-01-07 | Disposition: A | Discharge status: Home / Self Care | Payer: Medicare Other | Source: Ambulatory Visit | Attending: Internal Medicine | Admitting: Internal Medicine

## 2022-01-07 DIAGNOSIS — Z1231 Encounter for screening mammogram for malignant neoplasm of breast: Secondary | ICD-10-CM

## 2022-01-20 DIAGNOSIS — M1611 Unilateral primary osteoarthritis, right hip: Secondary | ICD-10-CM | POA: Diagnosis not present

## 2022-01-21 DIAGNOSIS — K5000 Crohn's disease of small intestine without complications: Secondary | ICD-10-CM | POA: Diagnosis not present

## 2022-02-04 ENCOUNTER — Ambulatory Visit: Payer: Medicare Other

## 2022-02-12 DIAGNOSIS — Z8616 Personal history of COVID-19: Secondary | ICD-10-CM | POA: Diagnosis not present

## 2022-02-12 DIAGNOSIS — M810 Age-related osteoporosis without current pathological fracture: Secondary | ICD-10-CM | POA: Diagnosis not present

## 2022-02-12 DIAGNOSIS — Z9049 Acquired absence of other specified parts of digestive tract: Secondary | ICD-10-CM | POA: Diagnosis not present

## 2022-02-12 DIAGNOSIS — K5000 Crohn's disease of small intestine without complications: Secondary | ICD-10-CM | POA: Diagnosis not present

## 2022-02-21 DIAGNOSIS — K625 Hemorrhage of anus and rectum: Secondary | ICD-10-CM | POA: Diagnosis not present

## 2022-07-28 DIAGNOSIS — K5000 Crohn's disease of small intestine without complications: Secondary | ICD-10-CM | POA: Diagnosis not present

## 2022-07-30 DIAGNOSIS — M1611 Unilateral primary osteoarthritis, right hip: Secondary | ICD-10-CM | POA: Diagnosis not present

## 2022-08-28 DIAGNOSIS — Z8616 Personal history of COVID-19: Secondary | ICD-10-CM | POA: Diagnosis not present

## 2022-08-28 DIAGNOSIS — Z0000 Encounter for general adult medical examination without abnormal findings: Secondary | ICD-10-CM | POA: Diagnosis not present

## 2022-08-28 DIAGNOSIS — Z96649 Presence of unspecified artificial hip joint: Secondary | ICD-10-CM | POA: Diagnosis not present

## 2022-08-28 DIAGNOSIS — E785 Hyperlipidemia, unspecified: Secondary | ICD-10-CM | POA: Diagnosis not present

## 2022-08-28 DIAGNOSIS — K5000 Crohn's disease of small intestine without complications: Secondary | ICD-10-CM | POA: Diagnosis not present

## 2022-08-28 DIAGNOSIS — I868 Varicose veins of other specified sites: Secondary | ICD-10-CM | POA: Diagnosis not present

## 2022-08-28 DIAGNOSIS — R5383 Other fatigue: Secondary | ICD-10-CM | POA: Diagnosis not present

## 2022-08-28 DIAGNOSIS — Z23 Encounter for immunization: Secondary | ICD-10-CM | POA: Diagnosis not present

## 2022-08-28 DIAGNOSIS — M810 Age-related osteoporosis without current pathological fracture: Secondary | ICD-10-CM | POA: Diagnosis not present

## 2022-09-02 DIAGNOSIS — E538 Deficiency of other specified B group vitamins: Secondary | ICD-10-CM | POA: Diagnosis not present

## 2022-09-09 DIAGNOSIS — E538 Deficiency of other specified B group vitamins: Secondary | ICD-10-CM | POA: Diagnosis not present

## 2022-09-12 ENCOUNTER — Other Ambulatory Visit: Payer: Self-pay | Admitting: Internal Medicine

## 2022-09-12 DIAGNOSIS — M810 Age-related osteoporosis without current pathological fracture: Secondary | ICD-10-CM

## 2022-09-16 DIAGNOSIS — E538 Deficiency of other specified B group vitamins: Secondary | ICD-10-CM | POA: Diagnosis not present

## 2022-09-23 DIAGNOSIS — E538 Deficiency of other specified B group vitamins: Secondary | ICD-10-CM | POA: Diagnosis not present

## 2022-10-24 DIAGNOSIS — E538 Deficiency of other specified B group vitamins: Secondary | ICD-10-CM | POA: Diagnosis not present

## 2022-11-04 DIAGNOSIS — E538 Deficiency of other specified B group vitamins: Secondary | ICD-10-CM | POA: Diagnosis not present

## 2022-11-04 DIAGNOSIS — R5383 Other fatigue: Secondary | ICD-10-CM | POA: Diagnosis not present

## 2022-11-04 DIAGNOSIS — Z9049 Acquired absence of other specified parts of digestive tract: Secondary | ICD-10-CM | POA: Diagnosis not present

## 2022-11-04 DIAGNOSIS — Z96649 Presence of unspecified artificial hip joint: Secondary | ICD-10-CM | POA: Diagnosis not present

## 2022-11-04 DIAGNOSIS — Z8616 Personal history of COVID-19: Secondary | ICD-10-CM | POA: Diagnosis not present

## 2022-11-05 DIAGNOSIS — M1612 Unilateral primary osteoarthritis, left hip: Secondary | ICD-10-CM | POA: Diagnosis not present

## 2022-11-05 DIAGNOSIS — M545 Low back pain, unspecified: Secondary | ICD-10-CM | POA: Diagnosis not present

## 2022-11-11 DIAGNOSIS — S39012D Strain of muscle, fascia and tendon of lower back, subsequent encounter: Secondary | ICD-10-CM | POA: Diagnosis not present

## 2022-11-11 DIAGNOSIS — M6281 Muscle weakness (generalized): Secondary | ICD-10-CM | POA: Diagnosis not present

## 2022-11-25 DIAGNOSIS — E538 Deficiency of other specified B group vitamins: Secondary | ICD-10-CM | POA: Diagnosis not present

## 2022-11-28 DIAGNOSIS — H2513 Age-related nuclear cataract, bilateral: Secondary | ICD-10-CM | POA: Diagnosis not present

## 2022-12-03 DIAGNOSIS — U071 COVID-19: Secondary | ICD-10-CM | POA: Diagnosis not present

## 2022-12-03 DIAGNOSIS — K5000 Crohn's disease of small intestine without complications: Secondary | ICD-10-CM | POA: Diagnosis not present

## 2022-12-03 DIAGNOSIS — R5383 Other fatigue: Secondary | ICD-10-CM | POA: Diagnosis not present

## 2022-12-03 DIAGNOSIS — R509 Fever, unspecified: Secondary | ICD-10-CM | POA: Diagnosis not present

## 2022-12-03 DIAGNOSIS — M7918 Myalgia, other site: Secondary | ICD-10-CM | POA: Diagnosis not present

## 2022-12-24 DIAGNOSIS — M1612 Unilateral primary osteoarthritis, left hip: Secondary | ICD-10-CM | POA: Diagnosis not present

## 2022-12-29 DIAGNOSIS — E538 Deficiency of other specified B group vitamins: Secondary | ICD-10-CM | POA: Diagnosis not present

## 2023-01-01 ENCOUNTER — Other Ambulatory Visit: Payer: Self-pay | Admitting: Internal Medicine

## 2023-01-01 DIAGNOSIS — Z1231 Encounter for screening mammogram for malignant neoplasm of breast: Secondary | ICD-10-CM

## 2023-01-02 ENCOUNTER — Other Ambulatory Visit (HOSPITAL_BASED_OUTPATIENT_CLINIC_OR_DEPARTMENT_OTHER): Payer: Self-pay

## 2023-01-02 ENCOUNTER — Other Ambulatory Visit: Payer: Self-pay

## 2023-01-02 ENCOUNTER — Emergency Department (HOSPITAL_BASED_OUTPATIENT_CLINIC_OR_DEPARTMENT_OTHER)
Admission: EM | Admit: 2023-01-02 | Discharge: 2023-01-02 | Disposition: A | Discharge status: Home / Self Care | Payer: Medicare Other | Source: Home / Self Care | Attending: Emergency Medicine | Admitting: Emergency Medicine

## 2023-01-02 ENCOUNTER — Encounter (HOSPITAL_BASED_OUTPATIENT_CLINIC_OR_DEPARTMENT_OTHER): Payer: Self-pay

## 2023-01-02 ENCOUNTER — Emergency Department (HOSPITAL_BASED_OUTPATIENT_CLINIC_OR_DEPARTMENT_OTHER): Payer: Medicare Other

## 2023-01-02 DIAGNOSIS — Z1152 Encounter for screening for COVID-19: Secondary | ICD-10-CM | POA: Insufficient documentation

## 2023-01-02 DIAGNOSIS — R9389 Abnormal findings on diagnostic imaging of other specified body structures: Secondary | ICD-10-CM | POA: Insufficient documentation

## 2023-01-02 DIAGNOSIS — Z7901 Long term (current) use of anticoagulants: Secondary | ICD-10-CM | POA: Insufficient documentation

## 2023-01-02 DIAGNOSIS — Z886 Allergy status to analgesic agent status: Secondary | ICD-10-CM | POA: Diagnosis not present

## 2023-01-02 DIAGNOSIS — K50919 Crohn's disease, unspecified, with unspecified complications: Secondary | ICD-10-CM | POA: Diagnosis not present

## 2023-01-02 DIAGNOSIS — K760 Fatty (change of) liver, not elsewhere classified: Secondary | ICD-10-CM | POA: Diagnosis not present

## 2023-01-02 DIAGNOSIS — R1084 Generalized abdominal pain: Secondary | ICD-10-CM | POA: Diagnosis not present

## 2023-01-02 DIAGNOSIS — R112 Nausea with vomiting, unspecified: Secondary | ICD-10-CM | POA: Diagnosis not present

## 2023-01-02 DIAGNOSIS — Z6825 Body mass index (BMI) 25.0-25.9, adult: Secondary | ICD-10-CM | POA: Diagnosis not present

## 2023-01-02 DIAGNOSIS — Z880 Allergy status to penicillin: Secondary | ICD-10-CM | POA: Diagnosis not present

## 2023-01-02 DIAGNOSIS — Z87891 Personal history of nicotine dependence: Secondary | ICD-10-CM | POA: Diagnosis not present

## 2023-01-02 DIAGNOSIS — K5090 Crohn's disease, unspecified, without complications: Secondary | ICD-10-CM | POA: Insufficient documentation

## 2023-01-02 DIAGNOSIS — Z90721 Acquired absence of ovaries, unilateral: Secondary | ICD-10-CM | POA: Diagnosis not present

## 2023-01-02 DIAGNOSIS — K50012 Crohn's disease of small intestine with intestinal obstruction: Secondary | ICD-10-CM | POA: Diagnosis not present

## 2023-01-02 DIAGNOSIS — Z96641 Presence of right artificial hip joint: Secondary | ICD-10-CM | POA: Diagnosis not present

## 2023-01-02 DIAGNOSIS — E860 Dehydration: Secondary | ICD-10-CM | POA: Diagnosis not present

## 2023-01-02 DIAGNOSIS — K6389 Other specified diseases of intestine: Secondary | ICD-10-CM | POA: Diagnosis not present

## 2023-01-02 DIAGNOSIS — Z888 Allergy status to other drugs, medicaments and biological substances status: Secondary | ICD-10-CM | POA: Diagnosis not present

## 2023-01-02 DIAGNOSIS — Z90710 Acquired absence of both cervix and uterus: Secondary | ICD-10-CM | POA: Diagnosis not present

## 2023-01-02 DIAGNOSIS — Z79899 Other long term (current) drug therapy: Secondary | ICD-10-CM | POA: Diagnosis not present

## 2023-01-02 DIAGNOSIS — Z9049 Acquired absence of other specified parts of digestive tract: Secondary | ICD-10-CM | POA: Diagnosis not present

## 2023-01-02 DIAGNOSIS — E440 Moderate protein-calorie malnutrition: Secondary | ICD-10-CM | POA: Diagnosis not present

## 2023-01-02 DIAGNOSIS — R109 Unspecified abdominal pain: Secondary | ICD-10-CM | POA: Diagnosis not present

## 2023-01-02 LAB — COMPREHENSIVE METABOLIC PANEL
ALT: 9 U/L (ref 0–44)
AST: 17 U/L (ref 15–41)
Albumin: 4.3 g/dL (ref 3.5–5.0)
Alkaline Phosphatase: 63 U/L (ref 38–126)
Anion gap: 11 (ref 5–15)
BUN: 14 mg/dL (ref 8–23)
CO2: 24 mmol/L (ref 22–32)
Calcium: 10.2 mg/dL (ref 8.9–10.3)
Chloride: 104 mmol/L (ref 98–111)
Creatinine, Ser: 0.68 mg/dL (ref 0.44–1.00)
GFR, Estimated: >60 mL/min (ref >60)
Glucose, Bld: 107 mg/dL — ABNORMAL HIGH (ref 70–99)
Potassium: 3.9 mmol/L (ref 3.5–5.1)
Sodium: 139 mmol/L (ref 135–145)
Total Bilirubin: 0.6 mg/dL (ref 0.3–1.2)
Total Protein: 7.8 g/dL (ref 6.5–8.1)

## 2023-01-02 LAB — CBC
HCT: 40.4 % (ref 36.0–46.0)
Hemoglobin: 13.5 g/dL (ref 12.0–15.0)
MCH: 30.6 pg (ref 26.0–34.0)
MCHC: 33.4 g/dL (ref 30.0–36.0)
MCV: 91.6 fL (ref 80.0–100.0)
Platelets: 329 10*3/uL (ref 150–400)
RBC: 4.41 MIL/uL (ref 3.87–5.11)
RDW: 14.5 % (ref 11.5–15.5)
WBC: 10.7 10*3/uL — ABNORMAL HIGH (ref 4.0–10.5)
nRBC: 0 % (ref 0.0–0.2)

## 2023-01-02 LAB — LIPASE, BLOOD: Lipase: 18 U/L (ref 11–51)

## 2023-01-02 LAB — RESP PANEL BY RT-PCR (RSV, FLU A&B, COVID)  RVPGX2
Influenza A by PCR: NEGATIVE
Influenza B by PCR: NEGATIVE
Resp Syncytial Virus by PCR: NEGATIVE
SARS Coronavirus 2 by RT PCR: NEGATIVE

## 2023-01-02 MED ORDER — MORPHINE SULFATE ER 15 MG PO TBCR
15.0000 mg | EXTENDED_RELEASE_TABLET | Freq: Two times a day (BID) | ORAL | 0 refills | Status: AC
  Filled 2023-01-02: qty 10, 5d supply, fill #0

## 2023-01-02 MED ORDER — MORPHINE SULFATE (PF) 2 MG/ML IV SOLN
2.0000 mg | Freq: Once | INTRAVENOUS | Status: AC
  Administered 2023-01-02: 2 mg via INTRAVENOUS
  Filled 2023-01-02: qty 1

## 2023-01-02 MED ORDER — ONDANSETRON HCL 4 MG/2ML IJ SOLN
4.0000 mg | Freq: Once | INTRAMUSCULAR | Status: AC | PRN
  Administered 2023-01-02: 4 mg via INTRAVENOUS
  Filled 2023-01-02: qty 2

## 2023-01-02 MED ORDER — MORPHINE SULFATE 30 MG PO TABS
30.0000 mg | ORAL_TABLET | ORAL | 0 refills | Status: DC | PRN
  Filled 2023-01-02: qty 30, 5d supply, fill #0

## 2023-01-02 MED ORDER — IOHEXOL 300 MG/ML  SOLN
100.0000 mL | Freq: Once | INTRAMUSCULAR | Status: AC | PRN
  Administered 2023-01-02: 100 mL via INTRAVENOUS

## 2023-01-02 NOTE — Discharge Instructions (Addendum)
Please make an appointment with your gastroenterologist to be seen in the next few days regarding recent ER visit and symptoms.  I have prescribed for you morphine to take every 4 hours as needed for severe abdominal pain that is not relieved with Tylenol.  Please use this medication for only severe pain as they are side effects.  Please not use medication and operate machinery or drive as it might make you drowsy.  Please continue taking your medications as prescribed.  Please continue to try and take and food and fluids as tolerated.  If symptoms worsen please return to ER.

## 2023-01-02 NOTE — ED Triage Notes (Signed)
Onset Sunday night of abdominal pain  Began vomiting last night.  Epigastric pain radiating down to lower abd. Last BM two days ago lose.

## 2023-01-02 NOTE — ED Provider Notes (Signed)
[facility] Provider Note   CSN: [id number] Arrival date & time: 01/02/23  1001     History  Chief Complaint  Patient presents with   Abdominal Pain    Diane [name] is a 72 y.o. female history of Crohn's presented with 6 days of midline abdominal pain with nonbloody emesis.  Patient states the abdominal pain is episodic without triggers and is in her epigastric, periumbilical, suprapubic region when it occurs.  Patient thought she had some bad food Sunday and that it would get better however patient is still experiencing symptoms.  Patient is taking Tylenol with no relief.  Patient also states she has not had a bowel movement in 2 days and the last bowel movement she had was unformed which is unusual to her.  Patient denied any hematochezia.  Patient stated her last colonoscopy the surgeon was unable to complete as her Crohn's obstructed the camera.  Patient denied fevers, chest pain, shortness of breath, hemoptysis, hematemesis, hematochezia, vaginal bleeding/discharge, syncope, neck pain, vision changes  Home Medications Prior to Admission medications   Medication Sig Start Date End Date Taking? Authorizing Provider  morphine (MSIR) 30 MG tablet Take 1 tablet (30 mg total) by mouth every 4 (four) hours as needed for up to 5 days for severe pain. 01/02/23 01/07/23 Yes Makaela Cando, Florene Route, PA-C  acetaminophen (TYLENOL) 500 MG tablet Take 1,000 mg by mouth 3 (three) times daily.    [provider]  Calcium Carbonate-Vit D-Min (CALCIUM 600+D3 PLUS MINERALS) 600-800 MG-UNIT TABS Take 2 tablets by mouth daily.    [provider]  mesalamine (APRISO) 0.375 g 24 hr capsule Take 1.5 g by mouth every morning. 06/27/21   [provider]  mesalamine (CANASA) 1000 MG suppository Place 1 suppository rectally daily as needed (rectal bleeding). 06/20/21   [provider]  oxyCODONE (ROXICODONE) 5 MG immediate release tablet Take 1  tablet (5 mg total) by mouth every 4 (four) hours as needed for severe pain. 09/24/21   Chadwell, Vonna Kotyk, PA-C  Polyethyl Glycol-Propyl Glycol (SYSTANE OP) Place 1-3 drops into both eyes 2 (two) times daily.    [provider]  Probiotic Product (ALIGN) 4 MG CAPS Take 4 mg by mouth at bedtime.    [provider]  promethazine (PHENERGAN) 12.5 MG tablet Take 1 tablet (12.5 mg total) by mouth every 6 (six) hours as needed for nausea or vomiting. 09/24/21   Chadwell, Vonna Kotyk, PA-C  rivaroxaban (XARELTO) 10 MG TABS tablet Take 1 tablet (10 mg total) by mouth daily. 09/24/21   Chadwell, Vonna Kotyk, PA-C  sennosides-docusate sodium (SENOKOT-S) 8.6-50 MG tablet Take 2 tablets by mouth daily. 09/24/21   Chadwell, Vonna Kotyk, PA-C      Allergies    Celebrex [celecoxib], Prednisone, Actonel [risedronate sodium], Albuterol, Ammonium-containing compounds, Amoxicillin, Boniva [ibandronic acid], Chantix [varenicline], Fosamax [alendronate sodium], Imuran [azathioprine], Nitrofuran derivatives, Penicillins, Prolia [denosumab], Tobradex [tobramycin-dexamethasone], Vagifem [estradiol], and Neosporin [neomycin-bacitracin zn-polymyx]    Review of Systems   Review of Systems  Gastrointestinal:  Positive for abdominal pain.  See HPI  Physical Exam Updated Vital Signs BP 106/66 (BP Location: Right Arm)   Pulse 88   Temp 98 F (36.7 C) (Oral)   Resp 16   Ht '5\' 2"'$  (1.575 m)   Wt 63.5 kg   SpO2 97%   BMI 25.61 kg/m  Physical Exam Vitals and nursing note reviewed.  Constitutional:      General: She is not in acute distress.  Appearance: She is well-developed.  HENT:     Head: Normocephalic and atraumatic.  Eyes:     Conjunctiva/sclera: Conjunctivae normal.  Cardiovascular:     Rate and Rhythm: Normal rate and regular rhythm.     Pulses: Normal pulses.     Heart sounds: Normal heart sounds. No murmur heard.    Comments: 2+ bilateral radial pulses with regular rate Pulmonary:     Effort:  Pulmonary effort is normal. No respiratory distress.     Breath sounds: Normal breath sounds.  Abdominal:     Palpations: Abdomen is soft.     Tenderness: There is generalized abdominal tenderness. There is no guarding or rebound.  Musculoskeletal:        General: No swelling.     Cervical back: Neck supple.  Skin:    General: Skin is warm and dry.     Capillary Refill: Capillary refill takes less than 2 seconds.  Neurological:     Mental Status: She is alert.  Psychiatric:        Mood and Affect: Mood normal.     ED Results / Procedures / Treatments   Labs (all labs ordered are listed, but only abnormal results are displayed) Labs Reviewed  COMPREHENSIVE METABOLIC PANEL - Abnormal; Notable for the following components:      Result Value   Glucose, Bld 107 (*)    All other components within normal limits  CBC - Abnormal; Notable for the following components:   WBC 10.7 (*)    All other components within normal limits  RESP PANEL BY RT-PCR (RSV, FLU A&B, COVID)  RVPGX2  LIPASE, BLOOD  URINALYSIS, ROUTINE W REFLEX MICROSCOPIC    EKG None  Radiology CT Abdomen Pelvis W Contrast  Result Date: 01/02/2023 CLINICAL DATA:  History of Crohn's.  Abdominal pain EXAM: CT ABDOMEN AND PELVIS WITH CONTRAST TECHNIQUE: Multidetector CT imaging of the abdomen and pelvis was performed using the standard protocol following bolus administration of intravenous contrast. RADIATION DOSE REDUCTION: This exam was performed according to the departmental dose-optimization program which includes automated exposure control, adjustment of the mA and/or kV according to patient size and/or use of iterative reconstruction technique. CONTRAST:  161m OMNIPAQUE IOHEXOL 300 MG/ML  SOLN COMPARISON:  CT 05/15/2020. FINDINGS: Lower chest: Linear opacity lung bases likely scar or atelectasis. No pleural effusion. Hepatobiliary: Mild fatty liver infiltration. Slightly nodular contour to the liver. Please correlate for  any evidence of chronic liver disease. Tiny low-attenuation lesion seen in segment 4A measuring 3 mm, unchanged from previous demonstrating long-term stability. Previous cholecystectomy. Patent portal vein. Pancreas: Unremarkable. No pancreatic ductal dilatation or surrounding inflammatory changes. Spleen: Normal in size without focal abnormality. Adrenals/Urinary Tract: Stable adrenal glands. No enhancing renal mass or collecting system dilatation. The ureters have a normal course and caliber down to the bladder. Preserved contours of the urinary bladder. Stomach/Bowel: Stomach is nondilated. Duodenal and jejunum are nondilated. There are some dilated loops of small bowel, ileum which are fluid-filled. Most distended loop is in the anterior left hemipelvis measuring up to 4.3 cm with the transition to less dilated loops and slow transition to distended loops at the terminal ileum. There are several skip areas of wall thickening along the small bowel including the loop in the left hemipelvis on series 2, image 46, at the terminal ileum. Slight mucosal hyperenhancement greatest of the terminal ileum. Possible active disease. Mild inflammatory stranding. Overall mild stricturing. Scattered colonic left-sided diverticula. The colon is relatively contracted. Vascular/Lymphatic: Mild atherosclerotic  calcified plaque. Normal caliber aorta and IVC. No specific abnormal lymph node enlargement identified in the abdomen and pelvis. Reproductive: Status post hysterectomy. No adnexal masses. Other: Trace ascites and mesenteric stranding.  No free air. Musculoskeletal: Streak artifact related to the patient's right hip arthroplasty. Scattered degenerative changes of the spine and pelvis. IMPRESSION: 1. Multiple dilated fluid-filled loops of of ileum with at least 2 skip areas of wall thickening and mucosal hyperenhancement consistent with history of inflammatory bowel disease. Most dilated loop is in the left mid abdomen  measuring to 4.3 cm with a focal stricture but no signs of true obstruction. Greatest area of hyperenhancement is along the distal terminal ileum. Trace ascites and mesenteric stranding. 2. Mild fatty liver infiltration. Slightly nodular contour to the liver. Please correlate for any evidence of chronic liver disease. Electronically Signed   By: Jill Side M.D.   On: 01/02/2023 12:33    Procedures Procedures    Medications Ordered in ED Medications  ondansetron (ZOFRAN) injection 4 mg (4 mg Intravenous Given 01/02/23 1025)  iohexol (OMNIPAQUE) 300 MG/ML solution 100 mL (100 mLs Intravenous Contrast Given 01/02/23 1157)  morphine (PF) 2 MG/ML injection 2 mg (2 mg Intravenous Given 01/02/23 1437)    ED Course/ Medical Decision Making/ A&P                             Medical Decision Making Amount and/or Complexity of Data Reviewed Labs: ordered. Radiology: ordered.  Risk Prescription drug management.   Ianthe Frady Reither 71 y.o. presented today for abdominal pain with process and diarrhea. Working DDx that I considered at this time includes, but not limited to, or exacerbation, viral illness, electrolyte imbalance, colitis, toxic megacolon, Pancreatitis.  Review of prior external notes: None  Unique Tests and My Interpretation:  Respiratory panel: Unremarkable Lipase: Unremarkable CBC: Unremarkable CMP: Unremarkable CT abdomen pelvis with contrast: Dilated loops in the mid abdomen consistent with IBD, mild fatty liver infiltrate, no obstructions noted  Discussion with Independent Historian: Husband  Discussion of Management of Tests: None  Risk:    Medium:  - prescription drug management   Risk Stratification Score: None  Staffed with Leanord Asal, MD  R/o DDx: Electrolyte imbalance: CMP unremarkable Pancreatitis: Lipase unremarkable Viral illness: Respiratory: Negative Colitis/toxic megacolon: CT negative  Plan: Patient presented for abdominal pain.  Patient  states she has only been able to tolerate small amounts of chicken soup and fluid.  On exam patient not appear to be in any distress and has stable vitals.  Patient was also tender generally throughout without peritoneal signs noted.  Labs initiated along with CT and Zofran.  Patient stable at this time.  CT showed what looks to be Crohn's exacerbation.  Patient has contraindication to prednisone as it puts her into psychosis.  At this time patient will be p.o. challenged and if successful will be discharged with close GI follow-up.  Patient states she was able to sip fluids however that caused her pain to return.  Patient given morphine and see if she is able to tolerate p.o.  If she is unable to tolerate p.o. will consider admission.  Patient was able to tolerate fluids p.o. after receiving morphine.  Patient will be given a short morphine prescription to get her pain under control until she is able to see her GI specialist.  Patient is stable for discharge at this time.  Patient was given return precautions.patient stable for discharge at  this time.  Patient verbalized understanding of plan.         Final Clinical Impression(s) / ED Diagnoses Final diagnoses:  Abnormal CT scan  Exacerbation of Crohn's disease without complication [facility])    Rx / DC Orders ED Discharge Orders          Ordered    morphine (MSIR) 30 MG tablet  Every 4 hours PRN        01/02/23 1510              Chuck Hint, PA-C 01/02/23 1512    Leanord Asal K, DO 01/02/23 1532

## 2023-01-03 ENCOUNTER — Encounter (HOSPITAL_COMMUNITY): Payer: Self-pay

## 2023-01-03 ENCOUNTER — Inpatient Hospital Stay (HOSPITAL_COMMUNITY)
Admission: EM | Admit: 2023-01-03 | Discharge: 2023-01-07 | DRG: 386 | Disposition: A | Discharge status: Home / Self Care | Payer: Medicare Other | Attending: Family Medicine | Admitting: Family Medicine

## 2023-01-03 DIAGNOSIS — K56600 Partial intestinal obstruction, unspecified as to cause: Secondary | ICD-10-CM

## 2023-01-03 DIAGNOSIS — Z90710 Acquired absence of both cervix and uterus: Secondary | ICD-10-CM

## 2023-01-03 DIAGNOSIS — R112 Nausea with vomiting, unspecified: Secondary | ICD-10-CM | POA: Diagnosis not present

## 2023-01-03 DIAGNOSIS — K760 Fatty (change of) liver, not elsewhere classified: Secondary | ICD-10-CM | POA: Diagnosis present

## 2023-01-03 DIAGNOSIS — Z1152 Encounter for screening for COVID-19: Secondary | ICD-10-CM

## 2023-01-03 DIAGNOSIS — K50012 Crohn's disease of small intestine with intestinal obstruction: Principal | ICD-10-CM | POA: Diagnosis present

## 2023-01-03 DIAGNOSIS — Z9049 Acquired absence of other specified parts of digestive tract: Secondary | ICD-10-CM

## 2023-01-03 DIAGNOSIS — Z96641 Presence of right artificial hip joint: Secondary | ICD-10-CM | POA: Diagnosis present

## 2023-01-03 DIAGNOSIS — R1084 Generalized abdominal pain: Secondary | ICD-10-CM | POA: Diagnosis not present

## 2023-01-03 DIAGNOSIS — E440 Moderate protein-calorie malnutrition: Secondary | ICD-10-CM | POA: Diagnosis present

## 2023-01-03 DIAGNOSIS — Z6825 Body mass index (BMI) 25.0-25.9, adult: Secondary | ICD-10-CM

## 2023-01-03 DIAGNOSIS — Z79899 Other long term (current) drug therapy: Secondary | ICD-10-CM

## 2023-01-03 DIAGNOSIS — Z90721 Acquired absence of ovaries, unilateral: Secondary | ICD-10-CM

## 2023-01-03 DIAGNOSIS — Z888 Allergy status to other drugs, medicaments and biological substances status: Secondary | ICD-10-CM

## 2023-01-03 DIAGNOSIS — Z880 Allergy status to penicillin: Secondary | ICD-10-CM

## 2023-01-03 DIAGNOSIS — Z7901 Long term (current) use of anticoagulants: Secondary | ICD-10-CM

## 2023-01-03 DIAGNOSIS — E860 Dehydration: Secondary | ICD-10-CM | POA: Diagnosis present

## 2023-01-03 DIAGNOSIS — Z87891 Personal history of nicotine dependence: Secondary | ICD-10-CM

## 2023-01-03 DIAGNOSIS — K5090 Crohn's disease, unspecified, without complications: Secondary | ICD-10-CM | POA: Diagnosis present

## 2023-01-03 DIAGNOSIS — Z886 Allergy status to analgesic agent status: Secondary | ICD-10-CM

## 2023-01-03 LAB — CBC
HCT: 45.1 % (ref 36.0–46.0)
Hemoglobin: 13.9 g/dL (ref 12.0–15.0)
MCH: 29.8 pg (ref 26.0–34.0)
MCHC: 30.8 g/dL (ref 30.0–36.0)
MCV: 96.8 fL (ref 80.0–100.0)
Platelets: 296 10*3/uL (ref 150–400)
RBC: 4.66 MIL/uL (ref 3.87–5.11)
RDW: 14.6 % (ref 11.5–15.5)
WBC: 8.7 10*3/uL (ref 4.0–10.5)
nRBC: 0 % (ref 0.0–0.2)

## 2023-01-03 LAB — BASIC METABOLIC PANEL
Anion gap: 13 (ref 5–15)
BUN: 21 mg/dL (ref 8–23)
CO2: 22 mmol/L (ref 22–32)
Calcium: 9.1 mg/dL (ref 8.9–10.3)
Chloride: 102 mmol/L (ref 98–111)
Creatinine, Ser: 0.79 mg/dL (ref 0.44–1.00)
GFR, Estimated: >60 mL/min (ref >60)
Glucose, Bld: 105 mg/dL — ABNORMAL HIGH (ref 70–99)
Potassium: 4.1 mmol/L (ref 3.5–5.1)
Sodium: 137 mmol/L (ref 135–145)

## 2023-01-03 MED ORDER — POTASSIUM CHLORIDE IN NACL 20-0.9 MEQ/L-% IV SOLN
INTRAVENOUS | Status: DC
  Filled 2023-01-03 (×5): qty 1000

## 2023-01-03 MED ORDER — METHYLPREDNISOLONE SODIUM SUCC 40 MG IJ SOLR
40.0000 mg | Freq: Once | INTRAMUSCULAR | Status: AC
  Administered 2023-01-03: 40 mg via INTRAVENOUS
  Filled 2023-01-03: qty 1

## 2023-01-03 MED ORDER — HYDROMORPHONE HCL 1 MG/ML IJ SOLN
0.5000 mg | INTRAMUSCULAR | Status: DC | PRN
  Administered 2023-01-03: 0.5 mg via INTRAVENOUS
  Administered 2023-01-03 – 2023-01-04 (×4): 1 mg via INTRAVENOUS
  Filled 2023-01-03 (×5): qty 1

## 2023-01-03 MED ORDER — ONDANSETRON HCL 4 MG/2ML IJ SOLN
4.0000 mg | Freq: Once | INTRAMUSCULAR | Status: AC
  Administered 2023-01-03: 4 mg via INTRAVENOUS
  Filled 2023-01-03: qty 2

## 2023-01-03 MED ORDER — ONDANSETRON HCL 4 MG/2ML IJ SOLN
4.0000 mg | Freq: Four times a day (QID) | INTRAMUSCULAR | Status: DC | PRN
  Administered 2023-01-03 – 2023-01-04 (×4): 4 mg via INTRAVENOUS
  Filled 2023-01-03 (×4): qty 2

## 2023-01-03 MED ORDER — SODIUM CHLORIDE 0.9 % IV BOLUS
1000.0000 mL | Freq: Once | INTRAVENOUS | Status: AC
  Administered 2023-01-03: 1000 mL via INTRAVENOUS

## 2023-01-03 MED ORDER — TRAZODONE HCL 50 MG PO TABS: 25.0000 mg | ORAL_TABLET | Freq: Every evening | ORAL | Status: DC | PRN

## 2023-01-03 MED ORDER — OXYCODONE HCL 5 MG PO TABS
5.0000 mg | ORAL_TABLET | ORAL | Status: DC | PRN
  Filled 2023-01-03: qty 1

## 2023-01-03 MED ORDER — ENOXAPARIN SODIUM 40 MG/0.4ML IJ SOSY
40.0000 mg | PREFILLED_SYRINGE | INTRAMUSCULAR | Status: DC
  Administered 2023-01-03: 40 mg via SUBCUTANEOUS
  Filled 2023-01-03: qty 0.4

## 2023-01-03 MED ORDER — ONDANSETRON HCL 4 MG PO TABS: 4.0000 mg | ORAL_TABLET | Freq: Four times a day (QID) | ORAL | Status: DC | PRN

## 2023-01-03 NOTE — ED Triage Notes (Signed)
Pt arrived via POV, c/o abd pain and vomiting. States pain worst around belly button. Was seen yesterday for same, woke this morning with continued sx.

## 2023-01-03 NOTE — Consult Note (Signed)
Referring Provider: Dr. Dwaine Gale Primary Care Physician:  Leeroy Cha, MD Primary Gastroenterologist:  Dr. Watt Climes  Reason for Consultation:  Crohn's disease; Abdominal pain  HPI: Diane [name] is a 72 y.o. female with longstanding Crohn's ileitis who last had a colonoscopy in 01/2021 that showed a stenotic ICV that could not be traversed. A tubular adenoma was removed and showed left-sided diverticulosis as well. 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 Starting a week ago she developed diffuse abdominal pain with nausea and vomiting. Low grade fever of 99.9 and chills 2 days ago. Usually has formed stools on a regular basis but 3 days ago stopped having stools and had a "slimy" fluid that came out of her rectum that day and no stool or slimy fluid since that day 3 days ago. Denies rectal bleeding. Reports being on Humira 2 years ago but it was stopped due to developing shingles and needing medicine for that. Reports compliance with Apriso 1.5 g/day prior to admit. Husband in room.  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    with hx of seizure - 2015   SUI (stress urinary incontinence, female)     Past Surgical History:  Procedure Laterality Date   4 repair of prolapse      ABDOMINAL HYSTERECTOMY  1980   ANTERIOR AND POSTERIOR REPAIR  06/13/2013   Procedure:  AUGUEMENTED  RECTOCELE REPAIR WITH XENFORM;  Surgeon: Ailene Rud, MD;  Location: [facility];  Service: Urology;;   BENIGN EXCISION RIGHT BREAST CYST  1996   BREAST EXCISIONAL BIOPSY Right 1996   CHOLECYSTECTOMY N/A 03/30/2017   Procedure: LAPAROSCOPIC CHOLECYSTECTOMY;  Surgeon: Rolm Bookbinder, MD;  Location: [facility];  Service: General;  Laterality: N/A;   CYSTOSCOPY N/A 06/13/2013   Procedure: Consuela Mimes;  Surgeon: Ailene Rud, MD;  Location: [facility];  Service: Urology;  Laterality: N/A;   ENDOSCOPIC RETROGRADE CHOLANGIOPANCREATOGRAPHY (ERCP) WITH PROPOFOL N/A 03/29/2017   Procedure: ENDOSCOPIC RETROGRADE CHOLANGIOPANCREATOGRAPHY (ERCP) WITH PROPOFOL;  Surgeon: Ronnette Juniper, MD;  Location: [facility];  Service: Gastroenterology;  Laterality: N/A;  prone position   EXPLORATORY LAPAROTOMY  EARLY 1970'S   INFERLITITY   EXPLORATORY LAPAROTOMY     endometriosis   LAPAROSCOPY/ LYSIS ADHESIONS/ LEFT SALPINGOOPHORECTOMY/ RIGHT OVARIAN CYSTECTOMY  06/08/2000   PUBOVAGINAL SLING N/A 06/13/2013   Procedure: PELVIC FLOOR REPAIR  ,  WITH  UPHOLD  , ANTERIOR VAULT WITH KELLY PLICATION WITH LYNX SLING;  Surgeon: Ailene Rud, MD;  Location: [facility];  Service: Urology;  Laterality: N/A;   TOTAL HIP ARTHROPLASTY Right 09/24/2021   Procedure: TOTAL HIP ARTHROPLASTY;  Surgeon: Marchia Bond, MD;  Location: [facility];  Service: Orthopedics;  Laterality: Right;   WISDOM TOOTH EXTRACTION      Prior to Admission medications  Medication Sig Start Date End Date Taking? Authorizing Provider  acetaminophen (TYLENOL) 500 MG tablet Take 1,000 mg by mouth every 8 (eight) hours as needed for mild pain.   Yes [provider]  Calcium Carbonate-Vit D-Min (CALCIUM 600+D3 PLUS MINERALS) 600-800 MG-UNIT TABS Take 2 tablets by mouth daily.   Yes [provider]  mesalamine (APRISO) 0.375 g 24 hr capsule Take 1.5 g by mouth every morning. 06/27/21  Yes [provider]  mesalamine  (CANASA) 1000 MG suppository Place 1 suppository rectally daily as needed (rectal bleeding). 06/20/21  Yes [provider]  Polyethyl Glycol-Propyl Glycol (SYSTANE OP) Place 1-3 drops into both eyes 2 (two) times daily.   Yes [provider]  Probiotic Product (ALIGN) 4 MG CAPS Take 4 mg by mouth at bedtime.   Yes [provider]  morphine (MS CONTIN) 15 MG 12 hr tablet Take 1 tablet (15 mg total) by mouth every 12 (twelve) hours for 5 days. 01/02/23 01/07/23  Chuck Hint, PA-C    Scheduled Meds:  enoxaparin (LOVENOX) injection  40 mg Subcutaneous Q24H   Continuous Infusions:  0.9 % NaCl with KCl 20 mEq / L 75 mL/hr at 01/03/23 1256   PRN Meds:.HYDROmorphone (DILAUDID) injection, ondansetron **OR** ondansetron (ZOFRAN) IV, oxyCODONE, traZODone  Allergies as of 01/03/2023 - Review Complete 01/03/2023  Allergen Reaction Noted   Celebrex [celecoxib] Anaphylaxis 11/01/2012   Prednisone Other (See Comments) 09/09/2014   Actonel [risedronate sodium] Other (See Comments) 11/01/2012   Albuterol Other (See Comments) 10/03/2014   Ammonium-containing compounds  10/03/2014   Amoxicillin Diarrhea and Nausea And Vomiting 09/09/2021   Boniva [ibandronic acid] Other (See Comments) 11/01/2012   Chantix [varenicline] Other (See Comments) 11/01/2012   Fosamax [alendronate sodium] Other (See Comments) 11/01/2012   Imuran [azathioprine] Diarrhea and Nausea And Vomiting 10/03/2014   Nitrofuran derivatives Other (See Comments) 06/06/2013   Penicillins Diarrhea, Nausea And Vomiting, and Other (See Comments) 10/03/2014   Prolia [denosumab] Other (See Comments) 10/03/2014   Tobradex [tobramycin-dexamethasone] Other (See Comments) 11/01/2012   Vagifem [estradiol] Other (See Comments) 10/03/2014   Neosporin [neomycin-bacitracin zn-polymyx] Rash 11/01/2012    Family History  Problem Relation Age of Onset   Dementia Mother    Stroke Mother     Social History   Socioeconomic  History   Marital status: Married    Spouse name: Not on file   Number of children: Not on file   Years of education: Not on file   Highest education level: Not on file  Occupational History   Not on file  Tobacco Use   Smoking status: Former    Packs/day: 0.75    Years: 40.00    Total pack years: 30.00    Types: Cigarettes   Smokeless tobacco: Never  Vaping Use   Vaping Use: Never used  Substance and Sexual Activity   Alcohol use: Not Currently    Alcohol/week: 7.0 standard drinks of alcohol    Types: 7 Glasses of wine per week    Comment: not in years   Drug use: Never   Sexual activity: Not on file  Other Topics Concern   Not on file  Social History Narrative   Not on file   Social Determinants of Health   Financial Resource Strain: Not on file  Food Insecurity: Unknown (01/03/2023)   Hunger Vital Sign    Worried About Running Out of Food in the Last Year: Patient refused    [place] in the Last  Year: Patient refused  Transportation Needs: No Transportation Needs (01/03/2023)   PRAPARE - Hydrologist (Medical): No    Lack of Transportation (Non-Medical): No  Physical Activity: Not on file  Stress: Not on file  Social Connections: Not on file  Intimate Partner Violence: Not At Risk (01/03/2023)   Humiliation, Afraid, Rape, and Kick questionnaire    Fear of Current or Ex-Partner: No    Emotionally Abused: No    Physically Abused: No    Sexually Abused: No    Review of Systems: All negative except as stated above in HPI.  Physical Exam: Vital signs: Vitals:   01/03/23 1145 01/03/23 1229  BP: 113/65 103/61  Pulse: 95   Resp:  18  Temp:  98.8 F (37.1 C)  SpO2: 91% 94%   Last BM Date : 12/31/22 General:   Lethargic, Well-developed, well-nourished, pleasant and cooperative in NAD Head: normocephalic, atraumatic Eyes: anicteric sclera ENT: oropharynx clear Neck: supple, nontender Lungs:  Clear throughout to auscultation.    No wheezes, crackles, or rhonchi. No acute distress. Heart:  Regular rate and rhythm; no murmurs, clicks, rubs,  or gallops. Abdomen: diffuse tenderness with guarding, soft, nondistended, +BS Rectal:  Deferred Ext: no edema  GI:  Lab Results: Recent Labs    01/02/23 1020 01/03/23 1101  WBC 10.7* 8.7  HGB 13.5 13.9  HCT 40.4 45.1  PLT 329 296   BMET Recent Labs    01/02/23 1020 01/03/23 1101  NA 139 137  K 3.9 4.1  CL 104 102  CO2 24 22  GLUCOSE 107* 105*  BUN 14 21  CREATININE 0.68 0.79  CALCIUM 10.2 9.1   LFT Recent Labs    01/02/23 1020  PROT 7.8  ALBUMIN 4.3  AST 17  ALT 9  ALKPHOS 63  BILITOT 0.6   PT/INR No results for input(s): "LABPROT", "INR" in the last 72 hours.   Studies/Results: CT Abdomen Pelvis W Contrast  Result Date: 01/02/2023 CLINICAL DATA:  History of Crohn's.  Abdominal pain EXAM: CT ABDOMEN AND PELVIS WITH CONTRAST TECHNIQUE: Multidetector CT imaging of the abdomen and pelvis was performed using the standard protocol following bolus administration of intravenous contrast. RADIATION DOSE REDUCTION: This exam was performed according to the departmental dose-optimization program which includes automated exposure control, adjustment of the mA and/or kV according to patient size and/or use of iterative reconstruction technique. CONTRAST:  142m OMNIPAQUE IOHEXOL 300 MG/ML  SOLN COMPARISON:  CT 05/15/2020. FINDINGS: Lower chest: Linear opacity lung bases likely scar or atelectasis. No pleural effusion. Hepatobiliary: Mild fatty liver infiltration. Slightly nodular contour to the liver. Please correlate for any evidence of chronic liver disease. Tiny low-attenuation lesion seen in segment 4A measuring 3 mm, unchanged from previous demonstrating long-term stability. Previous cholecystectomy. Patent portal vein. Pancreas: Unremarkable. No pancreatic ductal dilatation or surrounding inflammatory changes. Spleen: Normal in size without focal abnormality.  Adrenals/Urinary Tract: Stable adrenal glands. No enhancing renal mass or collecting system dilatation. The ureters have a normal course and caliber down to the bladder. Preserved contours of the urinary bladder. Stomach/Bowel: Stomach is nondilated. Duodenal and jejunum are nondilated. There are some dilated loops of small bowel, ileum which are fluid-filled. Most distended loop is in the anterior left hemipelvis measuring up to 4.3 cm with the transition to less dilated loops and slow transition to distended loops at the terminal ileum. There are several skip areas of wall thickening along the small bowel including the loop in the left  hemipelvis on series 2, image 46, at the terminal ileum. Slight mucosal hyperenhancement greatest of the terminal ileum. Possible active disease. Mild inflammatory stranding. Overall mild stricturing. Scattered colonic left-sided diverticula. The colon is relatively contracted. Vascular/Lymphatic: Mild atherosclerotic calcified plaque. Normal caliber aorta and IVC. No specific abnormal lymph node enlargement identified in the abdomen and pelvis. Reproductive: Status post hysterectomy. No adnexal masses. Other: Trace ascites and mesenteric stranding.  No free air. Musculoskeletal: Streak artifact related to the patient's right hip arthroplasty. Scattered degenerative changes of the spine and pelvis. IMPRESSION: 1. Multiple dilated fluid-filled loops of of ileum with at least 2 skip areas of wall thickening and mucosal hyperenhancement consistent with history of inflammatory bowel disease. Most dilated loop is in the left mid abdomen measuring to 4.3 cm with a focal stricture but no signs of true obstruction. Greatest area of hyperenhancement is along the distal terminal ileum. Trace ascites and mesenteric stranding. 2. Mild fatty liver infiltration. Slightly nodular contour to the liver. Please correlate for any evidence of chronic liver disease. Electronically Signed   By: Jill Side M.D.   On: 01/02/2023 12:33    Impression/Plan: Crohn's ileitis with obstructive signs with abdominal pain, N/V, lack of BMs suggesting a partial SBO clinically. Long discussion with patient and her husband about her need for IV steroids. 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 Will give a one time dose of IV steroids and if she tolerates will repeat once a day for the next 2-3 days. Clear liquid diet ok. Supportive care. Will follow.    LOS: 0 days   Lear Ng  01/03/2023, 2:50 PM  Questions please call [phone]

## 2023-01-03 NOTE — ED Provider Notes (Signed)
[facility] Provider Note   CSN: [id number] Arrival date & time: 01/03/23  [id number]     History  Chief Complaint  Patient presents with   Emesis    Diane [name] is a 72 y.o. female history of Crohn's presented with abdominal pain and nausea and vomiting.  Patient was seen yesterday [facility] and diagnosed with Crohn's exacerbation after the CT scan.  Patient is unable to take prednisone as it puts her into a severe psychosis.  Patient was given morphine however has not been able to take it and has been unable to tolerate food or fluids since being discharged.  Abdominal pain is still epigastric, umbilical, suprapubic without radiation.  Patient had chest pain, dysuria, syncope, changes in sensation/motor skills, fever  Home Medications Prior to Admission medications   Medication Sig Start Date End Date Taking? Authorizing Provider  acetaminophen (TYLENOL) 500 MG tablet Take 1,000 mg by mouth every 8 (eight) hours as needed for mild pain.   Yes [provider]  Calcium Carbonate-Vit D-Min (CALCIUM 600+D3 PLUS MINERALS) 600-800 MG-UNIT TABS Take 2 tablets by mouth daily.   Yes [provider]  mesalamine (APRISO) 0.375 g 24 hr capsule Take 1.5 g by mouth every morning. 06/27/21  Yes [provider]  mesalamine (CANASA) 1000 MG suppository Place 1 suppository rectally daily as needed (rectal bleeding). 06/20/21  Yes [provider]  Polyethyl Glycol-Propyl Glycol (SYSTANE OP) Place 1-3 drops into both eyes 2 (two) times daily.   Yes [provider]  Probiotic Product (ALIGN) 4 MG CAPS Take 4 mg by mouth at bedtime.   Yes [provider]  morphine (MS CONTIN) 15 MG 12 hr tablet Take 1 tablet (15 mg total) by mouth every 12 (twelve) hours for 5 days. 01/02/23 01/07/23  Chuck Hint, PA-C      Allergies    Celebrex [celecoxib], Prednisone, Actonel [risedronate sodium], Albuterol,  Ammonium-containing compounds, Amoxicillin, Boniva [ibandronic acid], Chantix [varenicline], Fosamax [alendronate sodium], Imuran [azathioprine], Nitrofuran derivatives, Penicillins, Prolia [denosumab], Tobradex [tobramycin-dexamethasone], Vagifem [estradiol], and Neosporin [neomycin-bacitracin zn-polymyx]    Review of Systems   Review of Systems  Gastrointestinal:  Positive for vomiting.  See HPI  Physical Exam Updated Vital Signs BP 129/70 (BP Location: Left Arm)   Pulse (!) 105   Temp 98 F (36.7 C) (Oral)   Resp 20   SpO2 96%  Physical Exam Vitals and nursing note reviewed.  Constitutional:      General: She is not in acute distress.    Appearance: She is well-developed.  HENT:     Head: Normocephalic and atraumatic.  Eyes:     Extraocular Movements: Extraocular movements intact.     Conjunctiva/sclera: Conjunctivae normal.     Pupils: Pupils are equal, round, and reactive to light.  Cardiovascular:     Rate and Rhythm: Regular rhythm. Tachycardia present.     Pulses: Normal pulses.     Heart sounds: No murmur heard.    Comments: 2+ bilateral radial pulses with slightly increased rate Pulmonary:     Effort: Pulmonary effort is normal. No respiratory distress.     Breath sounds: Normal breath sounds.  Abdominal:     Palpations: Abdomen is soft.     Tenderness: There is abdominal tenderness (Epigastric, periumbilical, suprapubic). There is no guarding or rebound.  Musculoskeletal:        General: No swelling. Normal range of motion.     Cervical back: Normal range of motion  and neck supple.  Skin:    General: Skin is warm and dry.     Capillary Refill: Capillary refill takes less than 2 seconds.  Neurological:     General: No focal deficit present.     Mental Status: She is alert and oriented to person, place, and time.     Comments: Sensation intact in all 4 limbs  Psychiatric:        Mood and Affect: Mood normal.     ED Results / Procedures / Treatments    Labs (all labs ordered are listed, but only abnormal results are displayed) Labs Reviewed  BASIC METABOLIC PANEL  CBC    EKG None  Radiology CT Abdomen Pelvis W Contrast  Result Date: 01/02/2023 CLINICAL DATA:  History of Crohn's.  Abdominal pain EXAM: CT ABDOMEN AND PELVIS WITH CONTRAST TECHNIQUE: Multidetector CT imaging of the abdomen and pelvis was performed using the standard protocol following bolus administration of intravenous contrast. RADIATION DOSE REDUCTION: This exam was performed according to the departmental dose-optimization program which includes automated exposure control, adjustment of the mA and/or kV according to patient size and/or use of iterative reconstruction technique. CONTRAST:  152m OMNIPAQUE IOHEXOL 300 MG/ML  SOLN COMPARISON:  CT 05/15/2020. FINDINGS: Lower chest: Linear opacity lung bases likely scar or atelectasis. No pleural effusion. Hepatobiliary: Mild fatty liver infiltration. Slightly nodular contour to the liver. Please correlate for any evidence of chronic liver disease. Tiny low-attenuation lesion seen in segment 4A measuring 3 mm, unchanged from previous demonstrating long-term stability. Previous cholecystectomy. Patent portal vein. Pancreas: Unremarkable. No pancreatic ductal dilatation or surrounding inflammatory changes. Spleen: Normal in size without focal abnormality. Adrenals/Urinary Tract: Stable adrenal glands. No enhancing renal mass or collecting system dilatation. The ureters have a normal course and caliber down to the bladder. Preserved contours of the urinary bladder. Stomach/Bowel: Stomach is nondilated. Duodenal and jejunum are nondilated. There are some dilated loops of small bowel, ileum which are fluid-filled. Most distended loop is in the anterior left hemipelvis measuring up to 4.3 cm with the transition to less dilated loops and slow transition to distended loops at the terminal ileum. There are several skip areas of wall thickening  along the small bowel including the loop in the left hemipelvis on series 2, image 46, at the terminal ileum. Slight mucosal hyperenhancement greatest of the terminal ileum. Possible active disease. Mild inflammatory stranding. Overall mild stricturing. Scattered colonic left-sided diverticula. The colon is relatively contracted. Vascular/Lymphatic: Mild atherosclerotic calcified plaque. Normal caliber aorta and IVC. No specific abnormal lymph node enlargement identified in the abdomen and pelvis. Reproductive: Status post hysterectomy. No adnexal masses. Other: Trace ascites and mesenteric stranding.  No free air. Musculoskeletal: Streak artifact related to the patient's right hip arthroplasty. Scattered degenerative changes of the spine and pelvis. IMPRESSION: 1. Multiple dilated fluid-filled loops of of ileum with at least 2 skip areas of wall thickening and mucosal hyperenhancement consistent with history of inflammatory bowel disease. Most dilated loop is in the left mid abdomen measuring to 4.3 cm with a focal stricture but no signs of true obstruction. Greatest area of hyperenhancement is along the distal terminal ileum. Trace ascites and mesenteric stranding. 2. Mild fatty liver infiltration. Slightly nodular contour to the liver. Please correlate for any evidence of chronic liver disease. Electronically Signed   By: AJill SideM.D.   On: 01/02/2023 12:33    Procedures .Critical Care  Performed by: SChuck Hint PA-C Authorized by: SChuck Hint PA-C  Critical care provider statement:    Critical care time (minutes):  35   Critical care time was exclusive of:  Separately billable procedures and treating other patients   Critical care was necessary to treat or prevent imminent or life-threatening deterioration of the following conditions: Crohn's exacerbation.   Critical care was time spent personally by me on the following activities:  Development of treatment plan with patient or  surrogate, discussions with consultants, evaluation of patient's response to treatment, examination of patient, obtaining history from patient or surrogate, review of old charts, re-evaluation of patient's condition, pulse oximetry, ordering and review of radiographic studies, ordering and review of laboratory studies and ordering and performing treatments and interventions   I assumed direction of critical care for this patient from another provider in my specialty: no     Care discussed with: admitting provider       Medications Ordered in ED Medications  ondansetron (ZOFRAN) injection 4 mg (4 mg Intravenous Given 01/03/23 1059)  sodium chloride 0.9 % bolus 1,000 mL (1,000 mLs Intravenous New Bag/Given 01/03/23 1057)    ED Course/ Medical Decision Making/ A&P                             Medical Decision Making Amount and/or Complexity of Data Reviewed Labs: ordered.  Risk Prescription drug management. Decision regarding hospitalization.   Diane [name] 71 y.o. presented today for abdominal pain. Working DDx that I considered at this time includes, but not limited to, Crohn's exacerbation.  Review of prior external notes: 01/02/2023 ED provider  Unique Tests and My Interpretation:  CBC: Pending CMP: Pending  Discussion with Independent Historian: Husband  Discussion of Management of Tests:  Collene Mares, MD gastroenterologist Darcella Cheshire, MD hospitalist  Risk: High:  - hospitalization or escalation of hospital-level care  Risk Stratification Score: None  Staffed with Pfeiffer, MD  Plan: Patient presented for abdominal pain.  Patient seen yesterday diagnosed with Crohn's exacerbation.  On exam patient was slightly tachycardic at 105 however he was not in distress.  Symptoms are the same as yesterday.  Since patient is feeling outpatient therapy I believe patient would benefit from inpatient stay for exacerbation as she has a complicated allergy/contraindication with medications  normally used with Crohn's exacerbations.  GI will be consulted and then the hospitalist will be consulted.  Patient given fluids and Zofran for symptom management.  Patient denied any pain while was in the room however if she starts endorsing pain she will be given morphine as that worked yesterday.  Patient stable at this time.  Dr. Collene Mares agreed to see the patient inpatient.  Hospitalist will be consulted.  After speaking with hospitalist, hospitalist accepted patient.  Patient stable at this time for admission.         Final Clinical Impression(s) / ED Diagnoses Final diagnoses:  Exacerbation of Crohn's disease without complication [facility])    Rx / DC Orders ED Discharge Orders     None         Elvina Sidle 01/03/23 1146    Charlesetta Shanks, MD 01/07/23 1900

## 2023-01-03 NOTE — H&P (Addendum)
History and Physical  Diane [name] [id number] DOB: 12-23-1950 DOA: 01/03/2023  PCP: Leeroy Cha, MD   Patient coming from: Home   Chief Complaint: Abd Pain   HPI: Diane [name] is a 72 y.o. female with medical history significant for longstanding Crohn's disease, arthritis, well-controlled as an outpatient without any requirement for surgery be admitted to the hospital with continued Crohn's flare despite conservative management.  History is provided by the patient and her husband at the bedside this morning, they states yesterday presented to [facility] with 6 days of midline abdominal pain with nonbloody emesis.  Pain seems to come and go, she was evaluated in the ER yesterday denies any fevers, chills, has not had any BM for the last couple of days which is strange for her.  Denies any blood in her stool.  No blood in emesis.  She was evaluated yesterday at [facility], lab work is relatively unremarkable, she was given oral medications, was feeling a little bit better after some IV fluids and sent home.  Unfortunately through the night, she had recurrence of pain and nausea.  Due to continued symptoms, she was advised by her PCPs office to come to the ER for evaluation.  No CT scan obtained at [facility] yesterday.  Here in the emergency department, patient was given some IV fluids, some Zofran.  She is feeling marginally better.  ER provider discussed with gastroenterology, who will see the patient in consultation in the ER this morning.  Review of Systems: Please see HPI for pertinent positives and negatives. A complete 10 system review of systems are otherwise negative.  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    with hx of seizure - 2015   SUI (stress urinary incontinence, female)    Past Surgical History:  Procedure Laterality Date   4 repair of prolapse      ABDOMINAL HYSTERECTOMY  1980   ANTERIOR AND POSTERIOR REPAIR  06/13/2013   Procedure:  AUGUEMENTED  RECTOCELE REPAIR WITH XENFORM;  Surgeon: Ailene Rud, MD;  Location: [facility];  Service: Urology;;   BENIGN EXCISION RIGHT BREAST CYST  1996   BREAST EXCISIONAL BIOPSY Right 1996   CHOLECYSTECTOMY N/A 03/30/2017   Procedure: LAPAROSCOPIC CHOLECYSTECTOMY;  Surgeon: Rolm Bookbinder, MD;  Location: [facility];  Service: General;  Laterality: N/A;   CYSTOSCOPY N/A 06/13/2013   Procedure: Consuela Mimes;  Surgeon: Ailene Rud, MD;  Location[facility];  Service: Urology;  Laterality: N/A;   ENDOSCOPIC RETROGRADE CHOLANGIOPANCREATOGRAPHY (ERCP) WITH PROPOFOL N/A 03/29/2017   Procedure: ENDOSCOPIC RETROGRADE CHOLANGIOPANCREATOGRAPHY (ERCP) WITH PROPOFOL;  Surgeon: Ronnette Juniper, MD;  Location: [facility];  Service: Gastroenterology;  Laterality: N/A;  prone position   EXPLORATORY LAPAROTOMY  EARLY 1970'S   INFERLITITY   EXPLORATORY LAPAROTOMY     endometriosis  LAPAROSCOPY/ LYSIS ADHESIONS/ LEFT SALPINGOOPHORECTOMY/ RIGHT OVARIAN CYSTECTOMY  06/08/2000   PUBOVAGINAL SLING N/A 06/13/2013   Procedure: PELVIC FLOOR REPAIR  ,  WITH  UPHOLD  , ANTERIOR VAULT WITH KELLY PLICATION WITH LYNX SLING;  Surgeon: Ailene Rud, MD;  [facility];  Service: Urology;  Laterality: N/A;   TOTAL HIP ARTHROPLASTY Right 09/24/2021   Procedure: TOTAL HIP ARTHROPLASTY;  Surgeon: Marchia Bond, MD;  Location: [facility];  Service:  Orthopedics;  Laterality: Right;   WISDOM TOOTH EXTRACTION      Social History:  reports that she has quit smoking. Her smoking use included cigarettes. She has a 30.00 pack-year smoking history. She has never used smokeless tobacco. She reports that she does not currently use alcohol after a past usage of about 7.0 standard drinks of alcohol per week. She reports that she does not use drugs.   Allergies  Allergen Reactions   Celebrex [Celecoxib] Anaphylaxis   Prednisone Other (See Comments)    Severe psychosis requiring hospitalization.  Avoid use if possible.     Actonel [Risedronate Sodium] Other (See Comments)    GI UPSET   Albuterol Other (See Comments)    sobbing   Ammonium-Containing Compounds     Unknown reaction   Amoxicillin Diarrhea and Nausea And Vomiting   Boniva [Ibandronic Acid] Other (See Comments)    GI UPSET   Chantix [Varenicline] Other (See Comments)    MOOD CHANGE   Fosamax [Alendronate Sodium] Other (See Comments)    GI UPSET   Imuran [Azathioprine] Diarrhea and Nausea And Vomiting   Nitrofuran Derivatives Other (See Comments)    CHILLS AND FEVER   Penicillins Diarrhea, Nausea And Vomiting and Other (See Comments)   Prolia [Denosumab] Other (See Comments)    Irritates chrons   Tobradex [Tobramycin-Dexamethasone] Other (See Comments)    SEVERE EYE IRRIATION   Vagifem [Estradiol] Other (See Comments)    bleeding   Neosporin [Neomycin-Bacitracin Zn-Polymyx] Rash    Family History  Problem Relation Age of Onset   Dementia Mother    Stroke Mother      Prior to Admission medications   Medication Sig Start Date End Date Taking? Authorizing Provider  acetaminophen (TYLENOL) 500 MG tablet Take 1,000 mg by mouth every 8 (eight) hours as needed for mild pain.   Yes [provider]  Calcium Carbonate-Vit D-Min (CALCIUM 600+D3 PLUS MINERALS) 600-800 MG-UNIT TABS Take 2 tablets by mouth daily.   Yes [provider]  mesalamine (APRISO) 0.375 g  24 hr capsule Take 1.5 g by mouth every morning. 06/27/21  Yes [provider]  mesalamine (CANASA) 1000 MG suppository Place 1 suppository rectally daily as needed (rectal bleeding). 06/20/21  Yes [provider]  Polyethyl Glycol-Propyl Glycol (SYSTANE OP) Place 1-3 drops into both eyes 2 (two) times daily.   Yes [provider]  Probiotic Product (ALIGN) 4 MG CAPS Take 4 mg by mouth at bedtime.   Yes [provider]  morphine (MS CONTIN) 15 MG 12 hr tablet Take 1 tablet (15 mg total) by mouth every 12 (twelve) hours for 5 days. 01/02/23 01/07/23  Chuck Hint, PA-C    Physical Exam: BP 129/70 (BP Location: Left Arm)   Pulse (!) 105   Temp 98 F (36.7 C) (Oral)   Resp 20   SpO2 96%   General:  Alert, oriented, calm, tired but not in any acute distress.  Nontoxic in appearance.  Pleasant and cooperative, and a good historian.  Eyes: EOMI, clear conjuctivae, white sclerea Neck: supple, no masses, trachea mildline  Cardiovascular: RRR, no murmurs or rubs, no peripheral edema  Respiratory: clear to auscultation bilaterally, no wheezes, no crackles  Abdomen: soft, diffusely tender in the epigastrium, and straight down to the low pelvis, nondistended, normal bowel tones heard  Skin: dry, no rashes  Musculoskeletal: no joint effusions, normal range of motion  Psychiatric: appropriate affect, normal speech  Neurologic: extraocular muscles intact, clear speech, moving all extremities with intact sensorium          Labs on Admission:  Basic Metabolic Panel: Recent Labs  Lab 01/02/23 1020  NA 139  K 3.9  CL 104  CO2 24  GLUCOSE 107*  BUN 14  CREATININE 0.68  CALCIUM 10.2   Liver Function Tests: Recent Labs  Lab 01/02/23 1020  AST 17  ALT 9  ALKPHOS 63  BILITOT 0.6  PROT 7.8  ALBUMIN 4.3   Recent Labs  Lab 01/02/23 1020  LIPASE 18   No results for input(s): "AMMONIA" in the last 168 hours. CBC: Recent Labs  Lab 01/02/23 1020  WBC  10.7*  HGB 13.5  HCT 40.4  MCV 91.6  PLT 329   Cardiac Enzymes: No results for input(s): "CKTOTAL", "CKMB", "CKMBINDEX", "TROPONINI" in the last 168 hours.  BNP (last 3 results) No results for input(s): "BNP" in the last 8760 hours.  ProBNP (last 3 results) No results for input(s): "PROBNP" in the last 8760 hours.  CBG: No results for input(s): "GLUCAP" in the last 168 hours.  Radiological Exams on Admission: CT Abdomen Pelvis W Contrast  Result Date: 01/02/2023 CLINICAL DATA:  History of Crohn's.  Abdominal pain EXAM: CT ABDOMEN AND PELVIS WITH CONTRAST TECHNIQUE: Multidetector CT imaging of the abdomen and pelvis was performed using the standard protocol following bolus administration of intravenous contrast. RADIATION DOSE REDUCTION: This exam was performed according to the departmental dose-optimization program which includes automated exposure control, adjustment of the mA and/or kV according to patient size and/or use of iterative reconstruction technique. CONTRAST:  19m OMNIPAQUE IOHEXOL 300 MG/ML  SOLN COMPARISON:  CT 05/15/2020. FINDINGS: Lower chest: Linear opacity lung bases likely scar or atelectasis. No pleural effusion. Hepatobiliary: Mild fatty liver infiltration. Slightly nodular contour to the liver. Please correlate for any evidence of chronic liver disease. Tiny low-attenuation lesion seen in segment 4A measuring 3 mm, unchanged from previous demonstrating long-term stability. Previous cholecystectomy. Patent portal vein. Pancreas: Unremarkable. No pancreatic ductal dilatation or surrounding inflammatory changes. Spleen: Normal in size without focal abnormality. Adrenals/Urinary Tract: Stable adrenal glands. No enhancing renal mass or collecting system dilatation. The ureters have a normal course and caliber down to the bladder. Preserved contours of the urinary bladder. Stomach/Bowel: Stomach is nondilated. Duodenal and jejunum are nondilated. There are some dilated loops of  small bowel, ileum which are fluid-filled. Most distended loop is in the anterior left hemipelvis measuring up to 4.3 cm with the transition to less dilated loops and slow transition to distended loops at the terminal ileum. There are several skip areas of wall thickening along the small bowel including the loop in the left hemipelvis on series 2, image 46, at the terminal ileum. Slight mucosal hyperenhancement greatest of the terminal ileum. Possible active disease. Mild inflammatory stranding. Overall mild stricturing. Scattered colonic left-sided diverticula. The colon is relatively contracted. Vascular/Lymphatic: Mild atherosclerotic calcified plaque. Normal caliber aorta and IVC. No specific abnormal lymph node enlargement identified in the abdomen and pelvis. Reproductive: Status post hysterectomy. No  adnexal masses. Other: Trace ascites and mesenteric stranding.  No free air. Musculoskeletal: Streak artifact related to the patient's right hip arthroplasty. Scattered degenerative changes of the spine and pelvis. IMPRESSION: 1. Multiple dilated fluid-filled loops of of ileum with at least 2 skip areas of wall thickening and mucosal hyperenhancement consistent with history of inflammatory bowel disease. Most dilated loop is in the left mid abdomen measuring to 4.3 cm with a focal stricture but no signs of true obstruction. Greatest area of hyperenhancement is along the distal terminal ileum. Trace ascites and mesenteric stranding. 2. Mild fatty liver infiltration. Slightly nodular contour to the liver. Please correlate for any evidence of chronic liver disease. Electronically Signed   By: Jill Side M.D.   On: 01/02/2023 12:33    Assessment/Plan Principal Problem:   Crohn's disease ([place]) -she has longstanding history of Crohn's, no evidence of abscess, or other significant complication.  CT scan as mentioned above with some multiple dilated fluid-filled loops.  She has failed outpatient management.  She is  hemodynamically stable, ER provider discussed with GI.  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 -Observation admission -IV fluids -Pain and nausea control -Await further evaluation and recommendations from gastroenterology  DVT prophylaxis: Lovenox and SCDs  Code Status: Full   Family Communication: Husband at bedside.   Disposition Plan: Home when stable   Consults called: ER MD spoke with GI.   Admission status: Obs   Time spent: 35 minutes  Abbee Cremeens Neva Seat MD [facility] Pager [phone]  If 7PM-7AM, please contact night-coverage [email or web address] Password [facility]  01/03/2023, 11:41 AM

## 2023-01-03 NOTE — ED Notes (Signed)
ED TO INPATIENT HANDOFF REPORT  Name/Age/Gender Diane [name] 72 y.o. female  Code Status    Code Status Orders  (From admission, onward)           Start     Ordered   01/03/23 1141  Full code  Continuous       Question:  By:  Answer:  Consent: discussion documented in EHR   01/03/23 1141           Code Status History     Date Active Date Inactive Code Status Order ID Comments User Context   09/24/2021 1556 09/25/2021 2006 Full Code [id number]  Leanord Asal Inpatient   03/29/2017 0130 03/31/2017 1344 Full Code [id number]  Vianne Bulls, MD Inpatient   09/06/2014 1604 09/09/2014 1452 Full Code [id number]  Tonye Royalty, MD Inpatient   09/05/2014 1440 09/06/2014 1604 Full Code [id number]  Clayborn Heron, RN Inpatient   09/01/2014 1254 09/05/2014 1440 Full Code [id number]  Orpah Greek., MD ED   08/28/2014 2255 08/30/2014 1708 Full Code [id number]  Etta Quill, DO Inpatient       Home/SNF/Other Home  Chief Complaint Crohn's disease [facility]) [K50.90]  Level of Care/Admitting Diagnosis ED Disposition     ED Disposition  Admit   Condition  --   Comment  Hospital [facility]: [facility] [[id number]]  Level of Care: Med-Surg [16]  May place patient in observation at [facility] or [facility] if equivalent level of care is available:: Yes  Covid Evaluation: Asymptomatic - no recent exposure (last 10 days) testing not required  Diagnosis: Crohn's disease [facility]) [id number]  Admitting Physician: Lucillie Garfinkel [id number]  Attending Physician: Shaughnessy.[place], MIR Kari.Conine [id number]          Medical History Past Medical History:  Diagnosis Date   Allergic rhinitis    Arthritis    Crohn disease ([place])    Crohn's disease ([place])    Crohn's disease ([place])    H/O bladder problems    History of endometriosis    History of small bowel obstruction    2011--  PARITAL --  RESOLVED -- NO SURGICAL INTERVENTION   Osteoporosis    Pelvic prolapse     PONV (postoperative nausea and vomiting)    AND HARD TO WAKE   Seizure ([place])    hx of 2015 not followed by a neurologist , 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  Avoid use if possible.     Actonel [Risedronate Sodium] Other (See Comments)    GI UPSET   Albuterol Other (See Comments)    sobbing   Ammonium-Containing Compounds     Unknown reaction   Amoxicillin Diarrhea and Nausea And Vomiting   Boniva [Ibandronic Acid] Other (See Comments)    GI UPSET   Chantix [Varenicline] Other (See Comments)    MOOD CHANGE   Fosamax [Alendronate Sodium] Other (See Comments)    GI UPSET   Imuran [Azathioprine] Diarrhea and Nausea And Vomiting   Nitrofuran Derivatives Other (See Comments)    CHILLS AND FEVER   Penicillins Diarrhea, Nausea And Vomiting and Other (See Comments)   Prolia [Denosumab] Other (See Comments)    Irritates  chrons   Tobradex [Tobramycin-Dexamethasone] Other (See Comments)    SEVERE EYE IRRIATION   Vagifem [Estradiol] Other (See Comments)    bleeding   Neosporin [Neomycin-Bacitracin Zn-Polymyx] Rash    IV Location/Drains/Wounds Patient Lines/Drains/Airways Status     Active Line/Drains/Airways     Name Placement date Placement time Site Days   Peripheral IV 01/03/23 20 G Left Antecubital 01/03/23  1057  Antecubital  less than 1   Incision - 4 Ports Abdomen Medial Right Right Umbilicus [id number]  0000000  -- 2105            Labs/Imaging Results for orders placed or performed during the hospital encounter of 01/02/23 (from the past 48 hour(s))  Lipase, blood     Status: None   Collection Time: 01/02/23 10:20 AM  Result Value Ref Range   Lipase 18 11 - 51 U/L     Comment: Performed at [facility], [address] [place], [place] [address]  Comprehensive metabolic panel     Status: Abnormal   Collection Time: 01/02/23 10:20 AM  Result Value Ref Range   Sodium 139 135 - 145 mmol/L   Potassium 3.9 3.5 - 5.1 mmol/L   Chloride 104 98 - 111 mmol/L   CO2 24 22 - 32 mmol/L   Glucose, Bld 107 (H) 70 - 99 mg/dL    Comment: Glucose reference range applies only to samples taken after fasting for at least 8 hours.   BUN 14 8 - 23 mg/dL   Creatinine, Ser 0.68 0.44 - 1.00 mg/dL   Calcium 10.2 8.9 - 10.3 mg/dL   Total Protein 7.8 6.5 - 8.1 g/dL   Albumin 4.3 3.5 - 5.0 g/dL   AST 17 15 - 41 U/L   ALT 9 0 - 44 U/L   Alkaline Phosphatase 63 38 - 126 U/L   Total Bilirubin 0.6 0.3 - 1.2 mg/dL   GFR, Estimated >60 >60 mL/min    Comment: (NOTE) Calculated using the CKD-EPI Creatinine Equation (2021)    Anion gap 11 5 - 15    Comment: Performed at [facility], [address] [place], [place] [address]  CBC     Status: Abnormal   Collection Time: 01/02/23 10:20 AM  Result Value Ref Range   WBC 10.7 (H) 4.0 - 10.5 K/uL   RBC 4.41 3.87 - 5.11 MIL/uL   Hemoglobin 13.5 12.0 - 15.0 g/dL   HCT 40.4 36.0 - 46.0 %   MCV 91.6 80.0 - 100.0 fL   MCH 30.6 26.0 - 34.0 pg   MCHC 33.4 30.0 - 36.0 g/dL   RDW 14.5 11.5 - 15.5 %   Platelets 329 150 - 400 K/uL   nRBC 0.0 0.0 - 0.2 %    Comment: Performed at [facility], [address], [place], [place] [address]  Resp panel by RT-PCR (RSV, Flu A&B, Covid) Anterior Nasal Swab     Status: None   Collection Time: 01/02/23 11:45 AM   Specimen: Anterior Nasal Swab  Result Value Ref Range   SARS Coronavirus 2 by RT PCR NEGATIVE NEGATIVE    Comment: (NOTE) SARS-CoV-2 target nucleic acids are NOT DETECTED.  The SARS-CoV-2 RNA is generally detectable in upper respiratory specimens during the acute phase of infection. The lowest concentration of SARS-CoV-2 viral  copies this assay can detect is 138 copies/mL. A negative result does not preclude SARS-Cov-2 infection and should not be used as the sole basis for treatment or other patient management decisions.  A negative result may occur with  improper specimen collection/handling, submission of specimen other than nasopharyngeal swab, presence of viral mutation(s) within the areas targeted by this assay, and inadequate number of viral copies(<138 copies/mL). A negative result must be combined with clinical observations, patient history, and epidemiological information. The expected result is Negative.  Fact Sheet for Patients:  EntrepreneurPulse.com.au  Fact Sheet for Healthcare Providers:  IncredibleEmployment.be  This test is no t yet approved or cleared by the [place] FDA and  has been authorized for detection and/or diagnosis of SARS-CoV-2 by FDA under an Emergency Use Authorization (EUA). This EUA will remain  in effect (meaning this test can be used) for the duration of the COVID-19 declaration under Section 564(b)(1) of the Act, 21 U.S.C.section 360bbb-3(b)(1), unless the authorization is terminated  or revoked sooner.       Influenza A by PCR NEGATIVE NEGATIVE   Influenza B by PCR NEGATIVE NEGATIVE    Comment: (NOTE) The Xpert Xpress SARS-CoV-2/FLU/RSV plus assay is intended as an aid in the diagnosis of influenza from Nasopharyngeal swab specimens and should not be used as a sole basis for treatment. Nasal washings and aspirates are unacceptable for Xpert Xpress SARS-CoV-2/FLU/RSV testing.  Fact Sheet for Patients: EntrepreneurPulse.com.au  Fact Sheet for Healthcare Providers: IncredibleEmployment.be  This test is not yet approved or cleared by the [place] FDA and has been authorized for detection and/or diagnosis of SARS-CoV-2 by FDA under an Emergency Use Authorization (EUA). This EUA will  remain in effect (meaning this test can be used) for the duration of the COVID-19 declaration under Section 564(b)(1) of the Act, 21 U.S.C. section 360bbb-3(b)(1), unless the authorization is terminated or revoked.     Resp Syncytial Virus by PCR NEGATIVE NEGATIVE    Comment: (NOTE) Fact Sheet for Patients: EntrepreneurPulse.com.au  Fact Sheet for Healthcare Providers: IncredibleEmployment.be  This test is not yet approved or cleared by the [place] FDA and has been authorized for detection and/or diagnosis of SARS-CoV-2 by FDA under an Emergency Use Authorization (EUA). This EUA will remain in effect (meaning this test can be used) for the duration of the COVID-19 declaration under Section 564(b)(1) of the Act, 21 U.S.C. section 360bbb-3(b)(1), unless the authorization is terminated or revoked.  Performed at [facility], [address] [place], [place] [address]    CT Abdomen Pelvis W Contrast  Result Date: 01/02/2023 CLINICAL DATA:  History of Crohn's.  Abdominal pain EXAM: CT ABDOMEN AND PELVIS WITH CONTRAST TECHNIQUE: Multidetector CT imaging of the abdomen and pelvis was performed using the standard protocol following bolus administration of intravenous contrast. RADIATION DOSE REDUCTION: This exam was performed according to the departmental dose-optimization program which includes automated exposure control, adjustment of the mA and/or kV according to patient size and/or use of iterative reconstruction technique. CONTRAST:  140m OMNIPAQUE IOHEXOL 300 MG/ML  SOLN COMPARISON:  CT 05/15/2020. FINDINGS: Lower chest: Linear opacity lung bases likely scar or atelectasis. No pleural effusion. Hepatobiliary: Mild fatty liver infiltration. Slightly nodular contour to the liver. Please correlate for any evidence of chronic liver disease. Tiny low-attenuation lesion seen in segment 4A measuring 3 mm, unchanged from previous  demonstrating long-term stability. Previous cholecystectomy. Patent portal vein. Pancreas: Unremarkable. No pancreatic ductal dilatation or surrounding inflammatory changes. Spleen: Normal in size without focal abnormality. Adrenals/Urinary Tract: Stable adrenal glands. No enhancing renal mass or collecting system dilatation. The ureters have a normal course and caliber down to the bladder. Preserved contours of the urinary bladder. Stomach/Bowel:  Stomach is nondilated. Duodenal and jejunum are nondilated. There are some dilated loops of small bowel, ileum which are fluid-filled. Most distended loop is in the anterior left hemipelvis measuring up to 4.3 cm with the transition to less dilated loops and slow transition to distended loops at the terminal ileum. There are several skip areas of wall thickening along the small bowel including the loop in the left hemipelvis on series 2, image 46, at the terminal ileum. Slight mucosal hyperenhancement greatest of the terminal ileum. Possible active disease. Mild inflammatory stranding. Overall mild stricturing. Scattered colonic left-sided diverticula. The colon is relatively contracted. Vascular/Lymphatic: Mild atherosclerotic calcified plaque. Normal caliber aorta and IVC. No specific abnormal lymph node enlargement identified in the abdomen and pelvis. Reproductive: Status post hysterectomy. No adnexal masses. Other: Trace ascites and mesenteric stranding.  No free air. Musculoskeletal: Streak artifact related to the patient's right hip arthroplasty. Scattered degenerative changes of the spine and pelvis. IMPRESSION: 1. Multiple dilated fluid-filled loops of of ileum with at least 2 skip areas of wall thickening and mucosal hyperenhancement consistent with history of inflammatory bowel disease. Most dilated loop is in the left mid abdomen measuring to 4.3 cm with a focal stricture but no signs of true obstruction. Greatest area of hyperenhancement is along the distal  terminal ileum. Trace ascites and mesenteric stranding. 2. Mild fatty liver infiltration. Slightly nodular contour to the liver. Please correlate for any evidence of chronic liver disease. Electronically Signed   By: Jill Side M.D.   On: 01/02/2023 12:33    Pending Labs Unresulted Labs (From admission, onward)     Start     Ordered   01/10/23 0500  Creatinine, serum  (enoxaparin (LOVENOX)    CrCl >/= 30 ml/min)  Weekly,   R     Comments: while on enoxaparin therapy    01/03/23 1141   01/04/23 [id number]  Basic metabolic panel  Tomorrow morning,   R        01/03/23 1141   01/04/23 0500  CBC  Tomorrow morning,   R        01/03/23 1141   01/03/23 1139  CBC  (enoxaparin (LOVENOX)    CrCl >/= 30 ml/min)  Once,   R       Comments: Baseline for enoxaparin therapy IF NOT ALREADY DRAWN.  Notify MD if PLT < 100 K.    01/03/23 1141   01/03/23 1139  Creatinine, serum  (enoxaparin (LOVENOX)    CrCl >/= 30 ml/min)  Once,   R       Comments: Baseline for enoxaparin therapy IF NOT ALREADY DRAWN.    01/03/23 1141   01/03/23 [id number]  Basic metabolic panel  Once,   STAT        01/03/23 1046   01/03/23 1047  CBC  Once,   STAT        01/03/23 1046            Vitals/Pain Today's Vitals   01/03/23 0957  BP: 129/70  Pulse: (!) 105  Resp: 20  Temp: 98 F (36.7 C)  TempSrc: Oral  SpO2: 96%    Isolation Precautions No active isolations  Medications Medications  enoxaparin (LOVENOX) injection 40 mg (has no administration in time range)  0.9 % NaCl with KCl 20 mEq/ L  infusion (has no administration in time range)  oxyCODONE (Oxy IR/ROXICODONE) immediate release tablet 5 mg (has no administration in time range)  HYDROmorphone (DILAUDID) injection 0.5-1 mg (has no administration  in time range)  traZODone (DESYREL) tablet 25 mg (has no administration in time range)  ondansetron (ZOFRAN) tablet 4 mg (has no administration in time range)    Or  ondansetron (ZOFRAN) injection 4 mg (has no  administration in time range)  ondansetron (ZOFRAN) injection 4 mg (4 mg Intravenous Given 01/03/23 1059)  sodium chloride 0.9 % bolus 1,000 mL (1,000 mLs Intravenous New Bag/Given 01/03/23 1057)    Mobility walks

## 2023-01-04 DIAGNOSIS — Z6825 Body mass index (BMI) 25.0-25.9, adult: Secondary | ICD-10-CM | POA: Diagnosis not present

## 2023-01-04 DIAGNOSIS — Z79899 Other long term (current) drug therapy: Secondary | ICD-10-CM | POA: Diagnosis not present

## 2023-01-04 DIAGNOSIS — Z9049 Acquired absence of other specified parts of digestive tract: Secondary | ICD-10-CM | POA: Diagnosis not present

## 2023-01-04 DIAGNOSIS — Z90721 Acquired absence of ovaries, unilateral: Secondary | ICD-10-CM | POA: Diagnosis not present

## 2023-01-04 DIAGNOSIS — Z87891 Personal history of nicotine dependence: Secondary | ICD-10-CM | POA: Diagnosis not present

## 2023-01-04 DIAGNOSIS — Z96641 Presence of right artificial hip joint: Secondary | ICD-10-CM | POA: Diagnosis present

## 2023-01-04 DIAGNOSIS — Z90710 Acquired absence of both cervix and uterus: Secondary | ICD-10-CM | POA: Diagnosis not present

## 2023-01-04 DIAGNOSIS — Z886 Allergy status to analgesic agent status: Secondary | ICD-10-CM | POA: Diagnosis not present

## 2023-01-04 DIAGNOSIS — Z1152 Encounter for screening for COVID-19: Secondary | ICD-10-CM | POA: Diagnosis not present

## 2023-01-04 DIAGNOSIS — K50919 Crohn's disease, unspecified, with unspecified complications: Secondary | ICD-10-CM

## 2023-01-04 DIAGNOSIS — R109 Unspecified abdominal pain: Secondary | ICD-10-CM | POA: Diagnosis present

## 2023-01-04 DIAGNOSIS — E860 Dehydration: Secondary | ICD-10-CM

## 2023-01-04 DIAGNOSIS — E440 Moderate protein-calorie malnutrition: Secondary | ICD-10-CM | POA: Diagnosis present

## 2023-01-04 DIAGNOSIS — Z7901 Long term (current) use of anticoagulants: Secondary | ICD-10-CM | POA: Diagnosis not present

## 2023-01-04 DIAGNOSIS — Z880 Allergy status to penicillin: Secondary | ICD-10-CM | POA: Diagnosis not present

## 2023-01-04 DIAGNOSIS — Z888 Allergy status to other drugs, medicaments and biological substances status: Secondary | ICD-10-CM | POA: Diagnosis not present

## 2023-01-04 DIAGNOSIS — K760 Fatty (change of) liver, not elsewhere classified: Secondary | ICD-10-CM | POA: Diagnosis present

## 2023-01-04 DIAGNOSIS — K50012 Crohn's disease of small intestine with intestinal obstruction: Secondary | ICD-10-CM | POA: Diagnosis present

## 2023-01-04 LAB — CBC
HCT: 37.5 % (ref 36.0–46.0)
Hemoglobin: 11.6 g/dL — ABNORMAL LOW (ref 12.0–15.0)
MCH: 29.9 pg (ref 26.0–34.0)
MCHC: 30.9 g/dL (ref 30.0–36.0)
MCV: 96.6 fL (ref 80.0–100.0)
Platelets: 272 10*3/uL (ref 150–400)
RBC: 3.88 MIL/uL (ref 3.87–5.11)
RDW: 14.5 % (ref 11.5–15.5)
WBC: 7.7 10*3/uL (ref 4.0–10.5)
nRBC: 0 % (ref 0.0–0.2)

## 2023-01-04 LAB — BASIC METABOLIC PANEL
Anion gap: 10 (ref 5–15)
BUN: 20 mg/dL (ref 8–23)
CO2: 22 mmol/L (ref 22–32)
Calcium: 8.7 mg/dL — ABNORMAL LOW (ref 8.9–10.3)
Chloride: 108 mmol/L (ref 98–111)
Creatinine, Ser: 0.57 mg/dL (ref 0.44–1.00)
GFR, Estimated: >60 mL/min (ref >60)
Glucose, Bld: 93 mg/dL (ref 70–99)
Potassium: 4.1 mmol/L (ref 3.5–5.1)
Sodium: 140 mmol/L (ref 135–145)

## 2023-01-04 MED ORDER — METHYLPREDNISOLONE SODIUM SUCC 40 MG IJ SOLR
40.0000 mg | Freq: Once | INTRAMUSCULAR | Status: AC
  Administered 2023-01-04: 40 mg via INTRAVENOUS
  Filled 2023-01-04: qty 1

## 2023-01-04 MED ORDER — ACETAMINOPHEN 325 MG PO TABS
650.0000 mg | ORAL_TABLET | Freq: Four times a day (QID) | ORAL | Status: AC | PRN
  Administered 2023-01-04 – 2023-01-07 (×2): 650 mg via ORAL
  Filled 2023-01-04 (×2): qty 2

## 2023-01-04 NOTE — Progress Notes (Signed)
PROGRESS NOTE    Diane [name]  [id number] DOB: [birth year] DOA: 01/03/2023 PCP: Leeroy Cha, MD    Chief Complaint  Patient presents with   Emesis    Brief Narrative:  Patient 72 year old female history of Crohn's ileitis last colonoscopy 01/2021 that showed stenotic ICV that could not be transversed, history of left-sided diverticulosis, arthritis who presented to the ED with his 6-day history of midline abdominal pain with nonbloody emesis, nausea.  Patient noted not to have had a bowel movement in the last couple of days prior to admission.  Patient seen in the ED.  CT abdomen and pelvis done with multiple dilated fluid-filled loops of the ileum with at least 2 skip areas of wall thickening and mucosal hyperenhancement consistent with history of IBD, most dilated loop in the left mid abdomen measuring 4.3 cm with focal stricture but no signs of true obstruction.  Greatest area of hyperenhancement noted along terminal ileum.  Trace ascites and mesenteric stranding.  Mild fatty liver infiltration.  Patient's symptoms concerning for partial small bowel obstruction clinically.  Patient seen in consultation by GI for Crohn's flare and patient started on IV steroids.   Assessment & Plan:   Principal Problem:   Crohn's disease ([place]) Active Problems:   Malnutrition of moderate degree ([place])   Dehydration  #1 Crohn's flare/Crohn's ileitis with obstructive signs -Patient presenting with concern for Crohn's flare/Crohn's ileitis with abdominal pain, nausea and emesis. - CT abdomen and pelvis done with multiple dilated fluid-filled loops of the ileum with at least 2 skip areas of wall thickening and mucosal hyperenhancement consistent with history of IBD, most dilated loop in the left mid abdomen measuring 4.3 cm with focal stricture but no signs of true obstruction.  Greatest area of hyperenhancement noted along terminal ileum.  Trace ascites and mesenteric stranding.  Mild fatty liver  infiltration.  Patient's symptoms concerning for partial small bowel obstruction clinically.  Patient seen in consultation by GI for Crohn's flare and patient started on IV steroids. -Patient seen in consultation by GI and felt patient had a Crohn's ileitis with concern for partial SBO, patient received a dose of IV steroids yesterday with some improvement yesterday however today states abdominal pain has returned and still with no bowel movement. 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 -Continue clear liquids. -GI following and appreciate input and recommendations.  2.  Dehydration -IV fluids.   DVT prophylaxis: Lovenox Code Status: Full Family Communication: Updated patient, husband, daughter at bedside. Disposition: Home when clinically improved and cleared by GI.  Status is: Inpatient The patient will require care spanning > 2 midnights and should be moved to inpatient because: Severity of illness   Consultants:  Gastroenterology: Dr. Michail Sermon 01/03/2023  Procedures CT abdomen and pelvis 01/02/2023   Antimicrobials: None   Subjective: Patient laying in bed.  Husband and daughter at bedside.  Patient stated after IV steroids yesterday she felt she was improving last night with flatulence, some improvement with abdominal pain, nausea and vomiting had improved.  Patient states however this morning started to have abdominal pain back as well as nausea.  No emesis.  Had some nausea with clear liquids.  Objective: Vitals:   01/04/23 0133 01/04/23 0444 01/04/23 1359 01/04/23 1744  BP: 124/62 117/63 (!) 113/56 129/64  Pulse: 91 93 84 85  Resp: '17 16  18  '$ Temp: 98.4 F (36.9 C) 98.1 F (36.7  C) 98.3 F (36.8 C) 99.2 F (37.3 C)  TempSrc: Oral Oral Oral Oral  SpO2: 98% 98% 93% 95%    Intake/Output Summary (Last 24 hours) at 01/04/2023 1955 Last data filed at 01/04/2023 1720 Gross  per 24 hour  Intake 1573.4 ml  Output 1700 ml  Net -126.6 ml   There were no vitals filed for this visit.  Examination:  General exam: Appears calm and comfortable  Respiratory system: Clear to auscultation. Respiratory effort normal. Cardiovascular system: S1 & S2 heard, RRR. No JVD, murmurs, rubs, gallops or clicks. No pedal edema. Gastrointestinal system: Abdomen is nondistended, soft and diffusely tender to palpation.  Positive bowel sound.  No rebound.  No guarding.  Central nervous system: Alert and oriented. No focal neurological deficits. Extremities: Symmetric 5 x 5 power. Skin: No rashes, lesions or ulcers Psychiatry: Judgement and insight appear normal. Mood & affect appropriate.     Data Reviewed: I have personally reviewed following labs and imaging studies  CBC: Recent Labs  Lab 01/02/23 1020 01/03/23 1101 01/04/23 0431  WBC 10.7* 8.7 7.7  HGB 13.5 13.9 11.6*  HCT 40.4 45.1 37.5  MCV 91.6 96.8 96.6  PLT 329 296 [id number]    Basic Metabolic Panel: Recent Labs  Lab 01/02/23 1020 01/03/23 1101 01/04/23 0431  NA 139 137 140  K 3.9 4.1 4.1  CL 104 102 108  CO2 '24 22 22  '$ GLUCOSE 107* 105* 93  BUN '14 21 20  '$ CREATININE 0.68 0.79 0.57  CALCIUM 10.2 9.1 8.7*    GFR: Estimated Creatinine Clearance: 55.7 mL/min (by C-G formula based on SCr of 0.57 mg/dL).  Liver Function Tests: Recent Labs  Lab 01/02/23 1020  AST 17  ALT 9  ALKPHOS 63  BILITOT 0.6  PROT 7.8  ALBUMIN 4.3    CBG: No results for input(s): "GLUCAP" in the last 168 hours.   Recent Results (from the past 240 hour(s))  Resp panel by RT-PCR (RSV, Flu A&B, Covid) Anterior Nasal Swab     Status: None   Collection Time: 01/02/23 11:45 AM   Specimen: Anterior Nasal Swab  Result Value Ref Range Status   SARS Coronavirus 2 by RT PCR NEGATIVE NEGATIVE Final    Comment: (NOTE) SARS-CoV-2 target nucleic acids are NOT DETECTED.  The SARS-CoV-2 RNA is generally detectable in upper  respiratory specimens during the acute phase of infection. The lowest concentration of SARS-CoV-2 viral copies this assay can detect is 138 copies/mL. A negative result does not preclude SARS-Cov-2 infection and should not be used as the sole basis for treatment or other patient management decisions. A negative result may occur with  improper specimen collection/handling, submission of specimen other than nasopharyngeal swab, presence of viral mutation(s) within the areas targeted by this assay, and inadequate number of viral copies(<138 copies/mL). A negative result must be combined with clinical observations, patient history, and epidemiological information. The expected result is Negative.  Fact Sheet for Patients:  EntrepreneurPulse.com.au  Fact Sheet for Healthcare Providers:  IncredibleEmployment.be  This test is no t yet approved or cleared by the [place] FDA and  has been authorized for detection and/or diagnosis of SARS-CoV-2 by FDA under an Emergency Use Authorization (EUA). This EUA will remain  in effect (meaning this test can be used) for the duration of the COVID-19 declaration under Section 564(b)(1) of the Act, 21 U.S.C.section 360bbb-3(b)(1), unless the authorization is terminated  or revoked sooner.       Influenza A by PCR NEGATIVE NEGATIVE  Final   Influenza B by PCR NEGATIVE NEGATIVE Final    Comment: (NOTE) The Xpert Xpress SARS-CoV-2/FLU/RSV plus assay is intended as an aid in the diagnosis of influenza from Nasopharyngeal swab specimens and should not be used as a sole basis for treatment. Nasal washings and aspirates are unacceptable for Xpert Xpress SARS-CoV-2/FLU/RSV testing.  Fact Sheet for Patients: EntrepreneurPulse.com.au  Fact Sheet for Healthcare Providers: IncredibleEmployment.be  This test is not yet approved or cleared by the [place] FDA and has been  authorized for detection and/or diagnosis of SARS-CoV-2 by FDA under an Emergency Use Authorization (EUA). This EUA will remain in effect (meaning this test can be used) for the duration of the COVID-19 declaration under Section 564(b)(1) of the Act, 21 U.S.C. section 360bbb-3(b)(1), unless the authorization is terminated or revoked.     Resp Syncytial Virus by PCR NEGATIVE NEGATIVE Final    Comment: (NOTE) Fact Sheet for Patients: EntrepreneurPulse.com.au  Fact Sheet for Healthcare Providers: IncredibleEmployment.be  This test is not yet approved or cleared by the [place] FDA and has been authorized for detection and/or diagnosis of SARS-CoV-2 by FDA under an Emergency Use Authorization (EUA). This EUA will remain in effect (meaning this test can be used) for the duration of the COVID-19 declaration under Section 564(b)(1) of the Act, 21 U.S.C. section 360bbb-3(b)(1), unless the authorization is terminated or revoked.  Performed at [facility], [address] [place], [place] [address]          Radiology Studies: No results found.      Scheduled Meds:  enoxaparin (LOVENOX) injection  40 mg Subcutaneous Q24H   Continuous Infusions:  0.9 % NaCl with KCl 20 mEq / L 75 mL/hr at 01/04/23 1430     LOS: 0 days    Time spent: 35 minutes    Irine Seal, MD [facility]   To contact the attending provider between 7A-7P or the covering provider during after hours 7P-7A, please log into the web site [email or web address] and access using universal [place] password for that web site. If you do not have the password, please call the hospital operator.  01/04/2023, 7:55 PM

## 2023-01-04 NOTE — Progress Notes (Signed)
[facility] Progress Note  Diane [name] y.o. Nov 17, 1950   Subjective: Increased abdominal pain sitting up in bed and drinking clears for breakfast but so far tolerating lunch clears without increased abdominal pain. No BMs or flatus. Denies N/V. Husband in room.  Objective: Vital signs: Vitals:   01/04/23 0444 01/04/23 1359  BP: 117/63 (!) 113/56  Pulse: 93 84  Resp: 16   Temp: 98.1 F (36.7 C) 98.3 F (36.8 C)  SpO2: 98% 93%    Physical Exam: Gen: alert, no acute distress, well-nourished HEENT: anicteric sclera CV: RRR Chest: CTA B Abd: diffuse tenderness with guarding, soft, nondistended, +BS Ext: no edema  Lab Results: Recent Labs    01/03/23 1101 01/04/23 0431  NA 137 140  K 4.1 4.1  CL 102 108  CO2 22 22  GLUCOSE 105* 93  BUN 21 20  CREATININE 0.79 0.57  CALCIUM 9.1 8.7*   Recent Labs    01/02/23 1020  AST 17  ALT 9  ALKPHOS 63  BILITOT 0.6  PROT 7.8  ALBUMIN 4.3   Recent Labs    01/03/23 1101 01/04/23 0431  WBC 8.7 7.7  HGB 13.9 11.6*  HCT 45.1 37.5  MCV 96.8 96.6  PLT 296 272      Assessment/Plan: Crohn's ileitis with concern for partial SBO - Tolerating IV steroids and has received 2 doses so far (one each day) but not ordering as a daily dose without confirming she is tolerating due to 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 Consider slowly advancing diet tomorrow. Dr. Paulita Fujita will f/u tomorrow and decide on whether to continue IV steroids and how to advance diet. Continue supportive care.   Diane [name] 01/04/2023, 4:01 PM  Questions please call [phone] ID: Diane [name], female   DOB: 1950/11/11, 72 y.o.   MRN: [id number]

## 2023-01-04 NOTE — Progress Notes (Cosign Needed)
  Transition of Care [facility]) Screening Note   Patient Details  Name: Diane [name] Date of Birth: 1951-07-19   Transition of Care [facility]) CM/SW Contact:    Henrietta Dine, RN Phone Number: 01/04/2023, 3:02 PM    Transition of Care Department [facility]) has reviewed patient and no TOC needs have been identified at this time. We will continue to monitor patient advancement through interdisciplinary progression rounds. If new patient transition needs arise, please place a TOC consult.

## 2023-01-05 DIAGNOSIS — K50919 Crohn's disease, unspecified, with unspecified complications: Secondary | ICD-10-CM | POA: Diagnosis not present

## 2023-01-05 DIAGNOSIS — E860 Dehydration: Secondary | ICD-10-CM | POA: Diagnosis not present

## 2023-01-05 LAB — CBC WITH DIFFERENTIAL/PLATELET
Abs Immature Granulocytes: 0.05 10*3/uL (ref 0.00–0.07)
Basophils Absolute: 0 10*3/uL (ref 0.0–0.1)
Basophils Relative: 0 %
Eosinophils Absolute: 0 10*3/uL (ref 0.0–0.5)
Eosinophils Relative: 0 %
HCT: 35.4 % — ABNORMAL LOW (ref 36.0–46.0)
Hemoglobin: 11.3 g/dL — ABNORMAL LOW (ref 12.0–15.0)
Immature Granulocytes: 1 %
Lymphocytes Relative: 11 %
Lymphs Abs: 0.9 10*3/uL (ref 0.7–4.0)
MCH: 30.2 pg (ref 26.0–34.0)
MCHC: 31.9 g/dL (ref 30.0–36.0)
MCV: 94.7 fL (ref 80.0–100.0)
Monocytes Absolute: 1 10*3/uL (ref 0.1–1.0)
Monocytes Relative: 14 %
Neutro Abs: 5.8 10*3/uL (ref 1.7–7.7)
Neutrophils Relative %: 74 %
Platelets: 251 10*3/uL (ref 150–400)
RBC: 3.74 MIL/uL — ABNORMAL LOW (ref 3.87–5.11)
RDW: 14.2 % (ref 11.5–15.5)
WBC: 7.7 10*3/uL (ref 4.0–10.5)
nRBC: 0 % (ref 0.0–0.2)

## 2023-01-05 LAB — BASIC METABOLIC PANEL
Anion gap: 7 (ref 5–15)
BUN: 16 mg/dL (ref 8–23)
CO2: 23 mmol/L (ref 22–32)
Calcium: 8.4 mg/dL — ABNORMAL LOW (ref 8.9–10.3)
Chloride: 106 mmol/L (ref 98–111)
Creatinine, Ser: 0.72 mg/dL (ref 0.44–1.00)
GFR, Estimated: >60 mL/min (ref >60)
Glucose, Bld: 103 mg/dL — ABNORMAL HIGH (ref 70–99)
Potassium: 4.2 mmol/L (ref 3.5–5.1)
Sodium: 136 mmol/L (ref 135–145)

## 2023-01-05 LAB — MAGNESIUM: Magnesium: 2.3 mg/dL (ref 1.7–2.4)

## 2023-01-05 MED ORDER — DIPHENHYDRAMINE HCL 25 MG PO CAPS
25.0000 mg | ORAL_CAPSULE | Freq: Once | ORAL | Status: AC
  Administered 2023-01-05: 25 mg via ORAL
  Filled 2023-01-05: qty 1

## 2023-01-05 NOTE — Progress Notes (Signed)
Subjective: Abdominal pain improving. Having flatus and some bowel function (looser stools).  Objective: Vital signs in last 24 hours: Temp:  [98.2 F (36.8 C)-99.2 F (37.3 C)] 98.5 F (36.9 C) (03/04 1225) Pulse Rate:  [79-92] 79 (03/04 1225) Resp:  [16-19] 16 (03/04 1225) BP: (122-133)/(64-71) 125/71 (03/04 1225) SpO2:  [95 %-98 %] 98 % (03/04 1225) Weight change:  Last BM Date : 01/04/23  PE: GEN: NAD, younger-appearing than stated age ABD:  Soft, mild distended, mild tenderness, no peritonitis.  Lab Results: CBC    Component Value Date/Time   WBC 7.7 01/05/2023 0426   RBC 3.74 (L) 01/05/2023 0426   HGB 11.3 (L) 01/05/2023 0426   HCT 35.4 (L) 01/05/2023 0426   PLT 251 01/05/2023 0426   MCV 94.7 01/05/2023 0426   MCH 30.2 01/05/2023 0426   MCHC 31.9 01/05/2023 0426   RDW 14.2 01/05/2023 0426   LYMPHSABS 0.9 01/05/2023 0426   MONOABS 1.0 01/05/2023 0426   EOSABS 0.0 01/05/2023 0426   BASOSABS 0.0 01/05/2023 0426  CMP     Component Value Date/Time   NA 136 01/05/2023 0426   K 4.2 01/05/2023 0426   CL 106 01/05/2023 0426   CO2 23 01/05/2023 0426   GLUCOSE 103 (H) 01/05/2023 0426   BUN 16 01/05/2023 0426   CREATININE 0.72 01/05/2023 0426   CALCIUM 8.4 (L) 01/05/2023 0426   PROT 7.8 01/02/2023 1020   ALBUMIN 4.3 01/02/2023 1020   AST 17 01/02/2023 1020   ALT 9 01/02/2023 1020   ALKPHOS 63 01/02/2023 1020   BILITOT 0.6 01/02/2023 1020   GFRNONAA >60 01/05/2023 0426   GFRAA >60 03/31/2017 0433     Assessment:   Crohn's flare. Partial small bowel obstruction, improving.  Plan:   IV steroids one more day. Clear liquids one more day. If continuing to improve, transition to po steroids (short course, given prior steroid psychosis, about 3-4 weeks, budesonide would be better to minimize systemic steroid effects) and advance diet tomorrow. [facility] will follow.   Landry Dyke 01/05/2023, 3:31 PM   Cell [phone] If no answer or after 5 PM call  [phone]

## 2023-01-05 NOTE — Progress Notes (Addendum)
PROGRESS NOTE    Diane [name]  [id number] DOB: 23-Jul-1951 DOA: 01/03/2023 PCP: Leeroy Cha, MD    Chief Complaint  Patient presents with   Emesis    Brief Narrative:  Patient 72 year old female history of Crohn's ileitis last colonoscopy 01/2021 that showed stenotic ICV that could not be transversed, history of left-sided diverticulosis, arthritis who presented to the ED with his 6-day history of midline abdominal pain with nonbloody emesis, nausea.  Patient noted not to have had a bowel movement in the last couple of days prior to admission.  Patient seen in the ED.  CT abdomen and pelvis done with multiple dilated fluid-filled loops of the ileum with at least 2 skip areas of wall thickening and mucosal hyperenhancement consistent with history of IBD, most dilated loop in the left mid abdomen measuring 4.3 cm with focal stricture but no signs of true obstruction.  Greatest area of hyperenhancement noted along terminal ileum.  Trace ascites and mesenteric stranding.  Mild fatty liver infiltration.  Patient's symptoms concerning for partial small bowel obstruction clinically.  Patient seen in consultation by GI for Crohn's flare and patient started on IV steroids.   Assessment & Plan:   Principal Problem:   Crohn's disease ([place]) Active Problems:   Malnutrition of moderate degree ([place])   Dehydration  #1 Crohn's flare/Crohn's ileitis with obstructive signs -Patient presenting with concern for Crohn's flare/Crohn's ileitis with abdominal pain, nausea and emesis. - CT abdomen and pelvis done with multiple dilated fluid-filled loops of the ileum with at least 2 skip areas of wall thickening and mucosal hyperenhancement consistent with history of IBD, most dilated loop in the left mid abdomen measuring 4.3 cm with focal stricture but no signs of true obstruction.  Greatest area of hyperenhancement noted along terminal ileum.  Trace ascites and mesenteric stranding.  Mild fatty liver  infiltration.  Patient's symptoms concerning for partial small bowel obstruction clinically.  Patient seen in consultation by GI for Crohn's flare and patient started on IV steroids. -Patient seen in consultation by GI and felt patient had a Crohn's ileitis with concern for partial SBO, patient received a dose of IV steroids x 2 days now with clinical improvement.  Noted after steroids yesterday had some redness around her throat however denied any difficulty swallowing or breathing.  -States continued improvement with abdominal pain, passing flatus and had bowel movement yesterday. -Tolerating clear liquids. 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 -Diet advancement per GI. -GI to reassess and determine need for another dose of IV steroids. -GI following and appreciate input and recommendations.  2.  Dehydration -Change IV fluids to normal saline.     DVT prophylaxis: Lovenox >>>> SCDs Code Status: Full Family Communication: Updated patient, husband, at bedside. Disposition: Home when clinically improved and cleared by GI.  Status is: Inpatient The patient will require care spanning > 2 midnights and should be moved to inpatient because: Severity of illness   Consultants:  Gastroenterology: Dr. Michail Sermon 01/03/2023  Procedures CT abdomen and pelvis 01/02/2023   Antimicrobials: None   Subjective: Patient sitting up in bed, seems happier this morning.  States feels a whole lot better.  Stated had good urine output, large loose stools yesterday, improvement with abdominal pain, able to tolerate clear liquids.  Husband at bedside.  Stated ambulated in the hallway.  Asking for Lovenox to be discontinued due to concern may have rectal bleeding  from her Crohn's flare.   Objective: Vitals:   01/04/23 1359 01/04/23 1744 01/04/23 2007 01/05/23 0659  BP: (!) 113/56 129/64 122/67 133/67   Pulse: 84 85 92 88  Resp:  '18 19 16  '$ Temp: 98.3 F (36.8 C) 99.2 F (37.3 C) 98.3 F (36.8 C) 98.2 F (36.8 C)  TempSrc: Oral Oral Oral Oral  SpO2: 93% 95% 95% 97%    Intake/Output Summary (Last 24 hours) at 01/05/2023 1225 Last data filed at 01/05/2023 [id number] Gross per 24 hour  Intake 3125.54 ml  Output 1800 ml  Net 1325.54 ml    There were no vitals filed for this visit.  Examination:  General exam: NAD.  Redness noted around neck region. Respiratory system: Lungs clear to auscultation bilaterally.  No wheezes, no crackles, no rhonchi.  Fair air movement.  Speaking in full sentences.   Cardiovascular system: Regular rate and rhythm no murmurs rubs or gallops.  No JVD.  No lower extremity edema.  Gastrointestinal system: Abdomen is soft, nondistended, no significant tenderness to palpation.  Positive bowel sounds.  No rebound.  No guarding.  Central nervous system: Alert and oriented. No focal neurological deficits. Extremities: Symmetric 5 x 5 power. Skin: No rashes, lesions or ulcers Psychiatry: Judgement and insight appear normal. Mood & affect appropriate.     Data Reviewed: I have personally reviewed following labs and imaging studies  CBC: Recent Labs  Lab 01/02/23 1020 01/03/23 1101 01/04/23 0431 01/05/23 0426  WBC 10.7* 8.7 7.7 7.7  NEUTROABS  --   --   --  5.8  HGB 13.5 13.9 11.6* 11.3*  HCT 40.4 45.1 37.5 35.4*  MCV 91.6 96.8 96.6 94.7  PLT 329 296 272 251     Basic Metabolic Panel: Recent Labs  Lab 01/02/23 1020 01/03/23 1101 01/04/23 0431 01/05/23 0426  NA 139 137 140 136  K 3.9 4.1 4.1 4.2  CL 104 102 108 106  CO2 '24 22 22 23  '$ GLUCOSE 107* 105* 93 103*  BUN '14 21 20 16  '$ CREATININE 0.68 0.79 0.57 0.72  CALCIUM 10.2 9.1 8.7* 8.4*  MG  --   --   --  2.3     GFR: Estimated Creatinine Clearance: 55.7 mL/min (by C-G formula based on SCr of 0.72 mg/dL).  Liver Function Tests: Recent Labs  Lab 01/02/23 1020  AST 17  ALT 9  ALKPHOS 63   BILITOT 0.6  PROT 7.8  ALBUMIN 4.3     CBG: No results for input(s): "GLUCAP" in the last 168 hours.   Recent Results (from the past 240 hour(s))  Resp panel by RT-PCR (RSV, Flu A&B, Covid) Anterior Nasal Swab     Status: None   Collection Time: 01/02/23 11:45 AM   Specimen: Anterior Nasal Swab  Result Value Ref Range Status   SARS Coronavirus 2 by RT PCR NEGATIVE NEGATIVE Final    Comment: (NOTE) SARS-CoV-2 target nucleic acids are NOT DETECTED.  The SARS-CoV-2 RNA is generally detectable in upper respiratory specimens during the acute phase of infection. The lowest concentration of SARS-CoV-2 viral copies this assay can detect is 138 copies/mL. A negative result does not preclude SARS-Cov-2 infection and should not be used as the sole basis for treatment or other patient management decisions. A negative result may occur with  improper specimen collection/handling, submission of specimen other than nasopharyngeal swab, presence of viral mutation(s) within the areas targeted by this assay, and inadequate number of viral copies(<138 copies/mL). A negative result must be  combined with clinical observations, patient history, and epidemiological information. The expected result is Negative.  Fact Sheet for Patients:  EntrepreneurPulse.com.au  Fact Sheet for Healthcare Providers:  IncredibleEmployment.be  This test is no t yet approved or cleared by the [place] FDA and  has been authorized for detection and/or diagnosis of SARS-CoV-2 by FDA under an Emergency Use Authorization (EUA). This EUA will remain  in effect (meaning this test can be used) for the duration of the COVID-19 declaration under Section 564(b)(1) of the Act, 21 U.S.C.section 360bbb-3(b)(1), unless the authorization is terminated  or revoked sooner.       Influenza A by PCR NEGATIVE NEGATIVE Final   Influenza B by PCR NEGATIVE NEGATIVE Final    Comment:  (NOTE) The Xpert Xpress SARS-CoV-2/FLU/RSV plus assay is intended as an aid in the diagnosis of influenza from Nasopharyngeal swab specimens and should not be used as a sole basis for treatment. Nasal washings and aspirates are unacceptable for Xpert Xpress SARS-CoV-2/FLU/RSV testing.  Fact Sheet for Patients: EntrepreneurPulse.com.au  Fact Sheet for Healthcare Providers: IncredibleEmployment.be  This test is not yet approved or cleared by the [place] FDA and has been authorized for detection and/or diagnosis of SARS-CoV-2 by FDA under an Emergency Use Authorization (EUA). This EUA will remain in effect (meaning this test can be used) for the duration of the COVID-19 declaration under Section 564(b)(1) of the Act, 21 U.S.C. section 360bbb-3(b)(1), unless the authorization is terminated or revoked.     Resp Syncytial Virus by PCR NEGATIVE NEGATIVE Final    Comment: (NOTE) Fact Sheet for Patients: EntrepreneurPulse.com.au  Fact Sheet for Healthcare Providers: IncredibleEmployment.be  This test is not yet approved or cleared by the [place] FDA and has been authorized for detection and/or diagnosis of SARS-CoV-2 by FDA under an Emergency Use Authorization (EUA). This EUA will remain in effect (meaning this test can be used) for the duration of the COVID-19 declaration under Section 564(b)(1) of the Act, 21 U.S.C. section 360bbb-3(b)(1), unless the authorization is terminated or revoked.  Performed at [facility], [address] [place], [place] [address]          Radiology Studies: No results found.      Scheduled Meds:  enoxaparin (LOVENOX) injection  40 mg Subcutaneous Q24H   Continuous Infusions:  0.9 % NaCl with KCl 20 mEq / L 75 mL/hr at 01/04/23 1430     LOS: 1 day    Time spent: 35 minutes    Irine Seal, MD [facility]   To  contact the attending provider between 7A-7P or the covering provider during after hours 7P-7A, please log into the web site [email or web address] and access using universal [place] password for that web site. If you do not have the password, please call the hospital operator.  01/05/2023, 12:25 PM

## 2023-01-05 NOTE — Progress Notes (Signed)
Mobility Specialist - Progress Note   01/05/23 1339  Mobility  Activity Ambulated independently in hallway  Level of Assistance Independent  Assistive Device None  Distance Ambulated (ft) 600 ft  Activity Response Tolerated well  Mobility Referral Yes  $Mobility charge 1 Mobility   Pt received in bed and agreeable to mobility. No complaints during session. Pt to bed after session with all needs met & nurse in bed.   [facility]

## 2023-01-06 LAB — RENAL FUNCTION PANEL
Albumin: 3 g/dL — ABNORMAL LOW (ref 3.5–5.0)
Albumin: 4 g/dL (ref 3.5–5.0)
Anion gap: 4 — ABNORMAL LOW (ref 5–15)
BUN: 9 mg/dL (ref 8–23)
BUN: 6 mg/dL — ABNORMAL LOW (ref 8–23)
CO2: 24 mmol/L (ref 22–32)
CO2: <7 mmol/L — ABNORMAL LOW (ref 22–32)
Calcium: 8.2 mg/dL — ABNORMAL LOW (ref 8.9–10.3)
Calcium: 9.2 mg/dL (ref 8.9–10.3)
Chloride: 109 mmol/L (ref 98–111)
Chloride: 105 mmol/L (ref 98–111)
Creatinine, Ser: 0.77 mg/dL (ref 0.44–1.00)
Creatinine, Ser: 0.77 mg/dL (ref 0.44–1.00)
GFR, Estimated: >60 mL/min (ref >60)
GFR, Estimated: >60 mL/min (ref >60)
Glucose, Bld: 84 mg/dL (ref 70–99)
Glucose, Bld: 94 mg/dL (ref 70–99)
Phosphorus: 2.4 mg/dL — ABNORMAL LOW (ref 2.5–4.6)
Phosphorus: 3 mg/dL (ref 2.5–4.6)
Potassium: 3.9 mmol/L (ref 3.5–5.1)
Potassium: 3.7 mmol/L (ref 3.5–5.1)
Sodium: 137 mmol/L (ref 135–145)
Sodium: 140 mmol/L (ref 135–145)

## 2023-01-06 LAB — CBC
HCT: 33.2 % — ABNORMAL LOW (ref 36.0–46.0)
Hemoglobin: 10.4 g/dL — ABNORMAL LOW (ref 12.0–15.0)
MCH: 30.1 pg (ref 26.0–34.0)
MCHC: 31.3 g/dL (ref 30.0–36.0)
MCV: 96.2 fL (ref 80.0–100.0)
Platelets: 227 10*3/uL (ref 150–400)
RBC: 3.45 MIL/uL — ABNORMAL LOW (ref 3.87–5.11)
RDW: 14.6 % (ref 11.5–15.5)
WBC: 6.8 10*3/uL (ref 4.0–10.5)
nRBC: 0 % (ref 0.0–0.2)

## 2023-01-06 LAB — MAGNESIUM: Magnesium: 2 mg/dL (ref 1.7–2.4)

## 2023-01-06 MED ORDER — BUDESONIDE 3 MG PO CPEP
9.0000 mg | ORAL_CAPSULE | Freq: Every day | ORAL | Status: DC
  Administered 2023-01-06 – 2023-01-07 (×2): 9 mg via ORAL
  Filled 2023-01-06 (×2): qty 3

## 2023-01-06 MED ORDER — DIPHENHYDRAMINE HCL 25 MG PO CAPS
25.0000 mg | ORAL_CAPSULE | Freq: Once | ORAL | Status: AC
  Administered 2023-01-06: 25 mg via ORAL
  Filled 2023-01-06: qty 1

## 2023-01-06 MED ORDER — K PHOS MONO-SOD PHOS DI & MONO 155-852-130 MG PO TABS
250.0000 mg | ORAL_TABLET | Freq: Two times a day (BID) | ORAL | Status: DC
  Administered 2023-01-06 – 2023-01-07 (×2): 250 mg via ORAL
  Filled 2023-01-06 (×2): qty 1

## 2023-01-06 NOTE — Progress Notes (Signed)
Mobility Specialist - Progress Note   01/06/23 1359  Mobility  Activity Ambulated independently in hallway  Level of Assistance Independent  Assistive Device None  Distance Ambulated (ft) 1200 ft  Activity Response Tolerated well  Mobility Referral Yes  $Mobility charge 1 Mobility   Pt received in bed and agreeable to mobility. No complaints during session. Pt to bed after session with all needs met & husband in room.   [facility]

## 2023-01-06 NOTE — Progress Notes (Signed)
Subjective:  Improving abdominal pain. Passing flatus and stool.  Objective: Vital signs in last 24 hours: Temp:  [97.7 F (36.5 C)-98 F (36.7 C)] 98 F (36.7 C) (03/05 1148) Pulse Rate:  [72-84] 84 (03/05 1148) Resp:  [17-18] 18 (03/05 1148) BP: (116-122)/(64-70) 116/70 (03/05 1148) SpO2:  [100 %] 100 % (03/05 1148) Weight change:  Last BM Date : 01/04/23  PE: GEN:  NAD ABD:  Soft, non-tender  Lab Results: CBC    Component Value Date/Time   WBC 6.8 01/06/2023 0452   RBC 3.45 (L) 01/06/2023 0452   HGB 10.4 (L) 01/06/2023 0452   HCT 33.2 (L) 01/06/2023 0452   PLT 227 01/06/2023 0452   MCV 96.2 01/06/2023 0452   MCH 30.1 01/06/2023 0452   MCHC 31.3 01/06/2023 0452   RDW 14.6 01/06/2023 0452   LYMPHSABS 0.9 01/05/2023 0426   MONOABS 1.0 01/05/2023 0426   EOSABS 0.0 01/05/2023 0426   BASOSABS 0.0 01/05/2023 0426  CMP     Component Value Date/Time   NA 137 01/06/2023 0452   K 3.9 01/06/2023 0452   CL 109 01/06/2023 0452   CO2 24 01/06/2023 0452   GLUCOSE 84 01/06/2023 0452   BUN 9 01/06/2023 0452   CREATININE 0.77 01/06/2023 0452   CALCIUM 8.2 (L) 01/06/2023 0452   PROT 7.8 01/02/2023 1020   ALBUMIN 3.0 (L) 01/06/2023 0452   AST 17 01/02/2023 1020   ALT 9 01/02/2023 1020   ALKPHOS 63 01/02/2023 1020   BILITOT 0.6 01/02/2023 1020   GFRNONAA >60 01/06/2023 0452   GFRAA >60 03/31/2017 0433   Assessment:  Crohn's flare, improving. Partial small bowel obstruction, improving.  Plan:  Full liquid diet today; if does well, can advance to soft diet tonight. Budesonide 9 mg po qd x 14 days, then 6 mg po qd x 7 days, then 3 mg po qd x 7 days, then d/c (4 month tapered course). Has follow-up with Dr. Watt Climes in one month, he can decide long-term Crohn's management strategy. 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  Benefits of this  regimen outweigh the risks.   Landry Dyke 01/06/2023, 2:35 PM   Cell [phone] If no answer or after 5 PM call [phone]

## 2023-01-06 NOTE — Progress Notes (Signed)
Mobility Specialist - Progress Note   01/06/23 0914  Mobility  Activity Ambulated independently in hallway  Level of Assistance Independent after set-up  Assistive Device None  Distance Ambulated (ft) 600 ft  Activity Response Tolerated well  Mobility Referral Yes  $Mobility charge 1 Mobility   Pt received in recliner w/ nursing in room and agreeable to mobility. No complaints during session. Pt to recliner after session with all needs met & call bell in reach.  [facility]

## 2023-01-06 NOTE — Plan of Care (Signed)
  Problem: Coping: Goal: Level of anxiety will decrease Outcome: Progressing   Problem: Pain Managment: Goal: General experience of comfort will improve Outcome: Progressing   

## 2023-01-06 NOTE — Progress Notes (Signed)
PROGRESS NOTE    Diane [name]  [id number] DOB: 07/24/1951 DOA: 01/03/2023 PCP: Leeroy Cha, MD    Chief Complaint  Patient presents with   Emesis    Brief Narrative:  Patient 72 year old female history of Crohn's ileitis last colonoscopy 01/2021 that showed stenotic ICV that could not be transversed, history of left-sided diverticulosis, arthritis who presented to the ED with his 6-day history of midline abdominal pain with nonbloody emesis, nausea.  Patient noted not to have had a bowel movement in the last couple of days prior to admission.  Patient seen in the ED.  CT abdomen and pelvis done with multiple dilated fluid-filled loops of the ileum with at least 2 skip areas of wall thickening and mucosal hyperenhancement consistent with history of IBD, most dilated loop in the left mid abdomen measuring 4.3 cm with focal stricture but no signs of true obstruction.  Greatest area of hyperenhancement noted along terminal ileum.  Trace ascites and mesenteric stranding.  Mild fatty liver infiltration.  Patient's symptoms concerning for partial small bowel obstruction clinically.  Patient seen in consultation by GI for Crohn's flare and patient started on IV steroids.   Assessment & Plan:   Principal Problem:   Crohn's disease ([place]) Active Problems:   Malnutrition of moderate degree ([place])   Dehydration  #1 Crohn's flare/Crohn's ileitis with obstructive signs -Patient presenting with concern for Crohn's flare/Crohn's ileitis with abdominal pain, nausea and emesis. - CT abdomen and pelvis done with multiple dilated fluid-filled loops of the ileum with at least 2 skip areas of wall thickening and mucosal hyperenhancement consistent with history of IBD, most dilated loop in the left mid abdomen measuring 4.3 cm with focal stricture but no signs of true obstruction.  Greatest area of hyperenhancement noted along terminal ileum.  Trace ascites and mesenteric stranding.  Mild fatty liver  infiltration.  Patient's symptoms concerning for partial small bowel obstruction clinically.  Patient seen in consultation by GI for Crohn's flare and patient started on IV steroids. -Patient seen in consultation by GI and felt patient had a Crohn's ileitis with concern for partial SBO, patient received a dose of IV steroids x 2 days with clinical improvement. -Noted after steroids yesterday had some redness around her throat however denied any difficulty swallowing or breathing.  -States continued improvement with abdominal pain, passing flatus and had bowel movement. -Tolerating full liquids. 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 -Diet advancement per GI. -GI transitioning patient to budesonide. -GI following and appreciate input and recommendations.  2.  Dehydration -Saline lock IV fluids.      DVT prophylaxis: Lovenox >>>> SCDs Code Status: Full Family Communication: Updated patient, husband, daughter at bedside. Disposition: Home when clinically improved and cleared by GI hopefully in the next 24 hours.  Status is: Inpatient The patient will require care spanning > 2 midnights and should be moved to inpatient because: Severity of illness   Consultants:  Gastroenterology: Dr. Michail Sermon 01/03/2023  Procedures CT abdomen and pelvis 01/02/2023   Antimicrobials: None   Subjective: Sitting up in bed.  Much brighter affect today.  Feeling better.  Denies any significant abdominal pain.  No nausea or vomiting.  Stated tolerating full liquid diet as well.  Having bowel movements.  Passing flatus.  Stated did not receive IV steroids yesterday.  Daughter and husband at bedside.    Objective: Vitals:   01/05/23 0659 01/05/23 1225  01/06/23 0719 01/06/23 1148  BP: 133/67 125/71 122/64 116/70  Pulse: 88 79 72 84  Resp: '16 16 17 18  '$ Temp: 98.2 F (36.8 C) 98.5 F (36.9 C) 97.7 F (36.5  C) 98 F (36.7 C)  TempSrc: Oral Oral Oral Oral  SpO2: 97% 98% 100% 100%    Intake/Output Summary (Last 24 hours) at 01/06/2023 1246 Last data filed at 01/06/2023 1149 Gross per 24 hour  Intake 2566.58 ml  Output 1101 ml  Net 1465.58 ml    There were no vitals filed for this visit.  Examination:  General exam: NAD.  Decreased erythema around neck region.  Respiratory system: CTAB.  No wheezes, no crackles, no rhonchi.  Fair air movement.  Speaking in full sentences.    Cardiovascular system: RRR no murmurs rubs or gallops.  No JVD.  No lower extremity edema.   Gastrointestinal system: Abdomen is soft, nondistended, significant improvement with tenderness to palpation.  Positive bowel sounds.  No rebound.  No guarding.  Central nervous system: Alert and oriented. No focal neurological deficits. Extremities: Symmetric 5 x 5 power. Skin: No rashes, lesions or ulcers Psychiatry: Judgement and insight appear normal. Mood & affect appropriate.     Data Reviewed: I have personally reviewed following labs and imaging studies  CBC: Recent Labs  Lab 01/02/23 1020 01/03/23 1101 01/04/23 0431 01/05/23 0426 01/06/23 0452  WBC 10.7* 8.7 7.7 7.7 6.8  NEUTROABS  --   --   --  5.8  --   HGB 13.5 13.9 11.6* 11.3* 10.4*  HCT 40.4 45.1 37.5 35.4* 33.2*  MCV 91.6 96.8 96.6 94.7 96.2  PLT 329 296 272 251 227     Basic Metabolic Panel: Recent Labs  Lab 01/02/23 1020 01/03/23 1101 01/04/23 0431 01/05/23 0426 01/06/23 0452  NA 139 137 140 136 137  K 3.9 4.1 4.1 4.2 3.9  CL 104 102 108 106 109  CO2 '24 22 22 23 24  '$ GLUCOSE 107* 105* 93 103* 84  BUN '14 21 20 16 9  '$ CREATININE 0.68 0.79 0.57 0.72 0.77  CALCIUM 10.2 9.1 8.7* 8.4* 8.2*  MG  --   --   --  2.3 2.0  PHOS  --   --   --   --  2.4*     GFR: Estimated Creatinine Clearance: 55.7 mL/min (by C-G formula based on SCr of 0.77 mg/dL).  Liver Function Tests: Recent Labs  Lab 01/02/23 1020 01/06/23 0452  AST 17  --   ALT  9  --   ALKPHOS 63  --   BILITOT 0.6  --   PROT 7.8  --   ALBUMIN 4.3 3.0*     CBG: No results for input(s): "GLUCAP" in the last 168 hours.   Recent Results (from the past 240 hour(s))  Resp panel by RT-PCR (RSV, Flu A&B, Covid) Anterior Nasal Swab     Status: None   Collection Time: 01/02/23 11:45 AM   Specimen: Anterior Nasal Swab  Result Value Ref Range Status   SARS Coronavirus 2 by RT PCR NEGATIVE NEGATIVE Final    Comment: (NOTE) SARS-CoV-2 target nucleic acids are NOT DETECTED.  The SARS-CoV-2 RNA is generally detectable in upper respiratory specimens during the acute phase of infection. The lowest concentration of SARS-CoV-2 viral copies this assay can detect is 138 copies/mL. A negative result does not preclude SARS-Cov-2 infection and should not be used as the sole basis for treatment or other patient management decisions. A negative result may  occur with  improper specimen collection/handling, submission of specimen other than nasopharyngeal swab, presence of viral mutation(s) within the areas targeted by this assay, and inadequate number of viral copies(<138 copies/mL). A negative result must be combined with clinical observations, patient history, and epidemiological information. The expected result is Negative.  Fact Sheet for Patients:  EntrepreneurPulse.com.au  Fact Sheet for Healthcare Providers:  IncredibleEmployment.be  This test is no t yet approved or cleared by the [place] FDA and  has been authorized for detection and/or diagnosis of SARS-CoV-2 by FDA under an Emergency Use Authorization (EUA). This EUA will remain  in effect (meaning this test can be used) for the duration of the COVID-19 declaration under Section 564(b)(1) of the Act, 21 U.S.C.section 360bbb-3(b)(1), unless the authorization is terminated  or revoked sooner.       Influenza A by PCR NEGATIVE NEGATIVE Final   Influenza B by PCR  NEGATIVE NEGATIVE Final    Comment: (NOTE) The Xpert Xpress SARS-CoV-2/FLU/RSV plus assay is intended as an aid in the diagnosis of influenza from Nasopharyngeal swab specimens and should not be used as a sole basis for treatment. Nasal washings and aspirates are unacceptable for Xpert Xpress SARS-CoV-2/FLU/RSV testing.  Fact Sheet for Patients: EntrepreneurPulse.com.au  Fact Sheet for Healthcare Providers: IncredibleEmployment.be  This test is not yet approved or cleared by the [place] FDA and has been authorized for detection and/or diagnosis of SARS-CoV-2 by FDA under an Emergency Use Authorization (EUA). This EUA will remain in effect (meaning this test can be used) for the duration of the COVID-19 declaration under Section 564(b)(1) of the Act, 21 U.S.C. section 360bbb-3(b)(1), unless the authorization is terminated or revoked.     Resp Syncytial Virus by PCR NEGATIVE NEGATIVE Final    Comment: (NOTE) Fact Sheet for Patients: EntrepreneurPulse.com.au  Fact Sheet for Healthcare Providers: IncredibleEmployment.be  This test is not yet approved or cleared by the [place] FDA and has been authorized for detection and/or diagnosis of SARS-CoV-2 by FDA under an Emergency Use Authorization (EUA). This EUA will remain in effect (meaning this test can be used) for the duration of the COVID-19 declaration under Section 564(b)(1) of the Act, 21 U.S.C. section 360bbb-3(b)(1), unless the authorization is terminated or revoked.  Performed at [facility], [address], [place], [place] [address]          Radiology Studies: No results found.      Scheduled Meds:   Continuous Infusions:     LOS: 2 days    Time spent: 35 minutes    Irine Seal, MD [facility]   To contact the attending provider between 7A-7P or the covering provider during  after hours 7P-7A, please log into the web site [email or web address] and access using universal [place] password for that web site. If you do not have the password, please call the hospital operator.  01/06/2023, 12:46 PM

## 2023-01-07 ENCOUNTER — Other Ambulatory Visit (HOSPITAL_COMMUNITY): Payer: Self-pay

## 2023-01-07 DIAGNOSIS — K56600 Partial intestinal obstruction, unspecified as to cause: Secondary | ICD-10-CM

## 2023-01-07 LAB — CBC
HCT: 35.4 % — ABNORMAL LOW (ref 36.0–46.0)
Hemoglobin: 11.6 g/dL — ABNORMAL LOW (ref 12.0–15.0)
MCH: 30.9 pg (ref 26.0–34.0)
MCHC: 32.8 g/dL (ref 30.0–36.0)
MCV: 94.1 fL (ref 80.0–100.0)
Platelets: 251 10*3/uL (ref 150–400)
RBC: 3.76 MIL/uL — ABNORMAL LOW (ref 3.87–5.11)
RDW: 14.4 % (ref 11.5–15.5)
WBC: 7.9 10*3/uL (ref 4.0–10.5)
nRBC: 0 % (ref 0.0–0.2)

## 2023-01-07 LAB — RENAL FUNCTION PANEL
Albumin: 3.2 g/dL — ABNORMAL LOW (ref 3.5–5.0)
Anion gap: 6 (ref 5–15)
BUN: <5 mg/dL — ABNORMAL LOW (ref 8–23)
CO2: 25 mmol/L (ref 22–32)
Calcium: 8.3 mg/dL — ABNORMAL LOW (ref 8.9–10.3)
Chloride: 105 mmol/L (ref 98–111)
Creatinine, Ser: 0.72 mg/dL (ref 0.44–1.00)
GFR, Estimated: >60 mL/min (ref >60)
Glucose, Bld: 111 mg/dL — ABNORMAL HIGH (ref 70–99)
Phosphorus: 4.1 mg/dL (ref 2.5–4.6)
Potassium: 3.6 mmol/L (ref 3.5–5.1)
Sodium: 136 mmol/L (ref 135–145)

## 2023-01-07 MED ORDER — DIPHENHYDRAMINE HCL 25 MG PO CAPS: 25.0000 mg | ORAL_CAPSULE | Freq: Three times a day (TID) | ORAL | Status: DC | PRN

## 2023-01-07 MED ORDER — ONDANSETRON HCL 4 MG PO TABS
4.0000 mg | ORAL_TABLET | Freq: Four times a day (QID) | ORAL | 0 refills | Status: DC | PRN
  Filled 2023-01-07: qty 20, 5d supply, fill #0

## 2023-01-07 MED ORDER — BUDESONIDE 3 MG PO CPEP
ORAL_CAPSULE | ORAL | 0 refills | Status: DC
  Filled 2023-01-07: qty 63, 28d supply, fill #0

## 2023-01-07 NOTE — Care Management Important Message (Signed)
Important Message  Patient Details IM Letter given. Name: Diane [name] MRN: [id number] Date of Birth: [birth year]   Medicare Important Message Given:  Yes     Kerin Salen 01/07/2023, 11:58 AM

## 2023-01-07 NOTE — Discharge Summary (Signed)
Physician Discharge Summary   Patient: Diane [name] MRN: [id number] DOB: December 11, 1950  Admit date:     01/03/2023  Discharge date: 01/07/23  Discharge Physician: Edwin Dada   PCP: Leeroy Cha, MD     Recommendations at discharge:  Follow up with GI Dr. Watt Climes as directed for Crohn's flare     Discharge Diagnoses: Principal Problem:   Partial small bowel obstruction due to Crohn's ileitis Active Problems:   Crohn's disease with flare   Malnutrition of moderate degree ([place])   Dehydration     Hospital Course: Diane [name] is a 72 y.o. F with Crohn's disease who presented with abdominal pain, N/V.    In the ER, CT showed multiple dilated fluid-filled loops of bowel.        Small bowel obstruction due to Crohn's Patient was admitted, GI were consulted, patient was started on steroids.  Her symptoms improved with steroids, and she was able to tolerate diet well.  She did have an episode of neck redness, which was discussed with GI, recommended budesonide, short taper.  She was able to tolerate budesonide in the hospital without immediate symptoms, and discharged with close GI follow-up             The [place] was reviewed for this patient prior to discharge.  Consultants: Gastroenterology   Disposition: Home   DISCHARGE MEDICATION: Allergies as of 01/07/2023       Reactions   Celebrex [celecoxib] Anaphylaxis   Prednisone Other (See Comments)   Severe psychosis requiring hospitalization.  Avoid use if possible.     Actonel [risedronate Sodium] Other (See Comments)   GI UPSET   Albuterol Other (See Comments)   sobbing   Ammonium-containing Compounds    Unknown reaction   Amoxicillin Diarrhea, Nausea And Vomiting   Boniva [ibandronic Acid] Other (See Comments)   GI UPSET   Chantix [varenicline] Other (See Comments)   MOOD CHANGE   Fosamax [alendronate Sodium] Other (See Comments)   GI UPSET    Imuran [azathioprine] Diarrhea, Nausea And Vomiting   Nitrofuran Derivatives Other (See Comments)   CHILLS AND FEVER   Penicillins Diarrhea, Nausea And Vomiting, Other (See Comments)   Prolia [denosumab] Other (See Comments)   Irritates chrons   Tobradex [tobramycin-dexamethasone] Other (See Comments)   SEVERE EYE IRRIATION   Vagifem [estradiol] Other (See Comments)   bleeding   Neosporin [neomycin-bacitracin Zn-polymyx] Rash        Medication List     STOP taking these medications    mesalamine 0.375 g 24 hr capsule Commonly known as: APRISO   mesalamine 1000 MG suppository Commonly known as: CANASA       TAKE these medications    acetaminophen 500 MG tablet Commonly known as: TYLENOL Take 1,000 mg by mouth every 8 (eight) hours as needed for mild pain.   Align 4 MG Caps Take 4 mg by mouth at bedtime.   budesonide 3 MG 24 hr capsule Commonly known as: ENTOCORT EC Take 3 capsules (9 mg) by mouth daily for 14 days; then take 2 capsules (6 mg) by mouth for 7 days, then take 1 capsule (3 mg) by mouth for 7 days then stop Start taking on: January 08, 2023   Calcium 600+D3 Plus Minerals 600-800 MG-UNIT Tabs Take 2 tablets by mouth daily.   morphine 15 MG 12 hr tablet Commonly known as: MS CONTIN Take 1 tablet (15 mg total) by mouth every 12 (twelve) hours for 5  days.   ondansetron 4 MG tablet Commonly known as: ZOFRAN Take 1 tablet (4 mg total) by mouth every 6 (six) hours as needed for nausea.   SYSTANE OP Place 1-3 drops into both eyes 2 (two) times daily.        Follow-up Information     Clarene Essex, MD Follow up.   Specialty: Gastroenterology Contact information: [id number] [place] [address] [phone]                 Discharge Instructions     Discharge instructions   Complete by: As directed    **IMPORTANT DISCHARGE INSTRUCTIONS**   From Dr. Loleta Books: You were admitted for a flare of Crohn's disease You had a  CT of the abdomen and it appears this caused a small bowel obstruction  Thankfully, you improved with steroids, and this appears to be going well.  Take budesonide on the following taper: 9 mg by mouth daily x 14 days, then 6 mg by mouth daily x 7 days, then 3 mg by mouth daily x7 days then stop  You have an appointment with Dr. Watt Climes already early next month (before tapered steroids expire)   Eat only a soft diet for next couple days, slowly advance as tolerated   Increase activity slowly   Complete by: As directed        Discharge Exam: Filed Weights   01/06/23 1148  Weight: 63.5 kg    General: Pt is alert, awake, not in acute distress Cardiovascular: RRR, nl S1-S2, no murmurs appreciated.   No LE edema.   Respiratory: Normal respiratory rate and rhythm.  CTAB without rales or wheezes. Abdominal: Abdomen soft and non-tender.  No distension or HSM.   Neuro/Psych: Strength symmetric in upper and lower extremities.  Judgment and insight appear normal.   Condition at discharge: good  The results of significant diagnostics from this hospitalization (including imaging, microbiology, ancillary and laboratory) are listed below for reference.   Imaging Studies: CT Abdomen Pelvis W Contrast  Result Date: 01/02/2023 CLINICAL DATA:  History of Crohn's.  Abdominal pain EXAM: CT ABDOMEN AND PELVIS WITH CONTRAST TECHNIQUE: Multidetector CT imaging of the abdomen and pelvis was performed using the standard protocol following bolus administration of intravenous contrast. RADIATION DOSE REDUCTION: This exam was performed according to the departmental dose-optimization program which includes automated exposure control, adjustment of the mA and/or kV according to patient size and/or use of iterative reconstruction technique. CONTRAST:  153m OMNIPAQUE IOHEXOL 300 MG/ML  SOLN COMPARISON:  CT 05/15/2020. FINDINGS: Lower chest: Linear opacity lung bases likely scar or atelectasis. No pleural  effusion. Hepatobiliary: Mild fatty liver infiltration. Slightly nodular contour to the liver. Please correlate for any evidence of chronic liver disease. Tiny low-attenuation lesion seen in segment 4A measuring 3 mm, unchanged from previous demonstrating long-term stability. Previous cholecystectomy. Patent portal vein. Pancreas: Unremarkable. No pancreatic ductal dilatation or surrounding inflammatory changes. Spleen: Normal in size without focal abnormality. Adrenals/Urinary Tract: Stable adrenal glands. No enhancing renal mass or collecting system dilatation. The ureters have a normal course and caliber down to the bladder. Preserved contours of the urinary bladder. Stomach/Bowel: Stomach is nondilated. Duodenal and jejunum are nondilated. There are some dilated loops of small bowel, ileum which are fluid-filled. Most distended loop is in the anterior left hemipelvis measuring up to 4.3 cm with the transition to less dilated loops and slow transition to distended loops at the terminal ileum. There are several skip areas of wall  thickening along the small bowel including the loop in the left hemipelvis on series 2, image 46, at the terminal ileum. Slight mucosal hyperenhancement greatest of the terminal ileum. Possible active disease. Mild inflammatory stranding. Overall mild stricturing. Scattered colonic left-sided diverticula. The colon is relatively contracted. Vascular/Lymphatic: Mild atherosclerotic calcified plaque. Normal caliber aorta and IVC. No specific abnormal lymph node enlargement identified in the abdomen and pelvis. Reproductive: Status post hysterectomy. No adnexal masses. Other: Trace ascites and mesenteric stranding.  No free air. Musculoskeletal: Streak artifact related to the patient's right hip arthroplasty. Scattered degenerative changes of the spine and pelvis. IMPRESSION: 1. Multiple dilated fluid-filled loops of of ileum with at least 2 skip areas of wall thickening and mucosal  hyperenhancement consistent with history of inflammatory bowel disease. Most dilated loop is in the left mid abdomen measuring to 4.3 cm with a focal stricture but no signs of true obstruction. Greatest area of hyperenhancement is along the distal terminal ileum. Trace ascites and mesenteric stranding. 2. Mild fatty liver infiltration. Slightly nodular contour to the liver. Please correlate for any evidence of chronic liver disease. Electronically Signed   By: Jill Side M.D.   On: 01/02/2023 12:33    Microbiology: Results for orders placed or performed during the hospital encounter of 01/02/23  Resp panel by RT-PCR (RSV, Flu A&B, Covid) Anterior Nasal Swab     Status: None   Collection Time: 01/02/23 11:45 AM   Specimen: Anterior Nasal Swab  Result Value Ref Range Status   SARS Coronavirus 2 by RT PCR NEGATIVE NEGATIVE Final    Comment: (NOTE) SARS-CoV-2 target nucleic acids are NOT DETECTED.  The SARS-CoV-2 RNA is generally detectable in upper respiratory specimens during the acute phase of infection. The lowest concentration of SARS-CoV-2 viral copies this assay can detect is 138 copies/mL. A negative result does not preclude SARS-Cov-2 infection and should not be used as the sole basis for treatment or other patient management decisions. A negative result may occur with  improper specimen collection/handling, submission of specimen other than nasopharyngeal swab, presence of viral mutation(s) within the areas targeted by this assay, and inadequate number of viral copies(<138 copies/mL). A negative result must be combined with clinical observations, patient history, and epidemiological information. The expected result is Negative.  Fact Sheet for Patients:  EntrepreneurPulse.com.au  Fact Sheet for Healthcare Providers:  IncredibleEmployment.be  This test is no t yet approved or cleared by the [place] FDA and  has been authorized for  detection and/or diagnosis of SARS-CoV-2 by FDA under an Emergency Use Authorization (EUA). This EUA will remain  in effect (meaning this test can be used) for the duration of the COVID-19 declaration under Section 564(b)(1) of the Act, 21 U.S.C.section 360bbb-3(b)(1), unless the authorization is terminated  or revoked sooner.       Influenza A by PCR NEGATIVE NEGATIVE Final   Influenza B by PCR NEGATIVE NEGATIVE Final    Comment: (NOTE) The Xpert Xpress SARS-CoV-2/FLU/RSV plus assay is intended as an aid in the diagnosis of influenza from Nasopharyngeal swab specimens and should not be used as a sole basis for treatment. Nasal washings and aspirates are unacceptable for Xpert Xpress SARS-CoV-2/FLU/RSV testing.  Fact Sheet for Patients: EntrepreneurPulse.com.au  Fact Sheet for Healthcare Providers: IncredibleEmployment.be  This test is not yet approved or cleared by the [place] FDA and has been authorized for detection and/or diagnosis of SARS-CoV-2 by FDA under an Emergency Use Authorization (EUA). This EUA will remain in effect (meaning this  test can be used) for the duration of the COVID-19 declaration under Section 564(b)(1) of the Act, 21 U.S.C. section 360bbb-3(b)(1), unless the authorization is terminated or revoked.     Resp Syncytial Virus by PCR NEGATIVE NEGATIVE Final    Comment: (NOTE) Fact Sheet for Patients: EntrepreneurPulse.com.au  Fact Sheet for Healthcare Providers: IncredibleEmployment.be  This test is not yet approved or cleared by the [place] FDA and has been authorized for detection and/or diagnosis of SARS-CoV-2 by FDA under an Emergency Use Authorization (EUA). This EUA will remain in effect (meaning this test can be used) for the duration of the COVID-19 declaration under Section 564(b)(1) of the Act, 21 U.S.C. section 360bbb-3(b)(1), unless the authorization is  terminated or revoked.  Performed at [facility], [address], [place], [place] [address]     Labs: CBC: Recent Labs  Lab 01/03/23 1101 01/04/23 0431 01/05/23 0426 01/06/23 0452 01/07/23 0501  WBC 8.7 7.7 7.7 6.8 7.9  NEUTROABS  --   --  5.8  --   --   HGB 13.9 11.6* 11.3* 10.4* 11.6*  HCT 45.1 37.5 35.4* 33.2* 35.4*  MCV 96.8 96.6 94.7 96.2 94.1  PLT 296 272 251 227 [id number]   Basic Metabolic Panel: Recent Labs  Lab 01/04/23 0431 01/05/23 0426 01/06/23 0452 01/06/23 1930 01/07/23 0501  NA 140 136 137 140 136  K 4.1 4.2 3.9 3.7 3.6  CL 108 106 109 105 105  CO2 '22 23 24 '$ <7* 25  GLUCOSE 93 103* 84 94 111*  BUN '20 16 9 '$ 6* <5*  CREATININE 0.57 0.72 0.77 0.77 0.72  CALCIUM 8.7* 8.4* 8.2* 9.2 8.3*  MG  --  2.3 2.0  --   --   PHOS  --   --  2.4* 3.0 4.1   Liver Function Tests: Recent Labs  Lab 01/02/23 1020 01/06/23 0452 01/06/23 1930 01/07/23 0501  AST 17  --   --   --   ALT 9  --   --   --   ALKPHOS 63  --   --   --   BILITOT 0.6  --   --   --   PROT 7.8  --   --   --   ALBUMIN 4.3 3.0* 4.0 3.2*   CBG: No results for input(s): "GLUCAP" in the last 168 hours.  Discharge time spent: approximately 35 minutes spent on discharge counseling, evaluation of patient on day of discharge, and coordination of discharge planning with nursing, social work, pharmacy and case management  Signed: Edwin Dada, MD [facility] 01/07/2023

## 2023-01-07 NOTE — Plan of Care (Signed)
Patient is stable for discharge. Discharge instructions have been given. All questions were answered. Patient is discharged to home with spouse.

## 2023-01-07 NOTE — Progress Notes (Signed)
Subjective: Tolerating soft diet. No abdominal pain. Having bowel movements.  Objective: Vital signs in last 24 hours: Temp:  [98 F (36.7 C)-98.3 F (36.8 C)] 98.3 F (36.8 C) (03/05 2107) Pulse Rate:  [76-84] 76 (03/05 2107) Resp:  [16-18] 16 (03/05 2107) BP: (104-116)/(62-70) 104/62 (03/05 2107) SpO2:  [99 %-100 %] 99 % (03/05 2107) Weight:  [63.5 kg] 63.5 kg (03/05 1148) Weight change:  Last BM Date : 01/07/23  PE: GEN:  NAD ABD:  No distention  Lab Results: CBC    Component Value Date/Time   WBC 7.9 01/07/2023 0501   RBC 3.76 (L) 01/07/2023 0501   HGB 11.6 (L) 01/07/2023 0501   HCT 35.4 (L) 01/07/2023 0501   PLT 251 01/07/2023 0501   MCV 94.1 01/07/2023 0501   MCH 30.9 01/07/2023 0501   MCHC 32.8 01/07/2023 0501   RDW 14.4 01/07/2023 0501   LYMPHSABS 0.9 01/05/2023 0426   MONOABS 1.0 01/05/2023 0426   EOSABS 0.0 01/05/2023 0426   BASOSABS 0.0 01/05/2023 0426  CMP     Component Value Date/Time   NA 136 01/07/2023 0501   K 3.6 01/07/2023 0501   CL 105 01/07/2023 0501   CO2 25 01/07/2023 0501   GLUCOSE 111 (H) 01/07/2023 0501   BUN <5 (L) 01/07/2023 0501   CREATININE 0.72 01/07/2023 0501   CALCIUM 8.3 (L) 01/07/2023 0501   PROT 7.8 01/02/2023 1020   ALBUMIN 3.2 (L) 01/07/2023 0501   AST 17 01/02/2023 1020   ALT 9 01/02/2023 1020   ALKPHOS 63 01/02/2023 1020   BILITOT 0.6 01/02/2023 1020   GFRNONAA >60 01/07/2023 0501   GFRAA >60 03/31/2017 0433   Assessment:   Crohn's flare, improving. Partial small bowel obstruction, resolved.  Plan:   Tapered course budesonide (9 mg po qd x 14 days, then 6 mg po qd x 7 days, then 3 mg po qd x 7 days); diphenhydramine prn (does not need scheduled dosing). Soft diet for next day or two; slowly advance as tolerated. [facility] will sign-off; please call with questions; patient has follow-up scheduled with Dr. Watt Climes for early next month. OK to discharge home today from GI perspective.   Landry Dyke 01/07/2023, 10:59 AM   Cell [phone] If no answer or after 5 PM call [phone]

## 2023-01-07 NOTE — Hospital Course (Signed)
72 year old female history of Crohn's ileitis last colonoscopy 01/2021 that showed stenotic ICV that could not be transversed, history of left-sided diverticulosis, arthritis who presented to the ED with his 6-day history of midline abdominal pain with nonbloody emesis, nausea. Patient noted not to have had a bowel movement in the last couple of days prior to admission. Patient seen in the ED. CT abdomen and pelvis done with multiple dilated fluid-filled loops of the ileum with at least 2 skip areas of wall thickening and mucosal hyperenhancement consistent with history of IBD, most dilated loop in the left mid abdomen measuring 4.3 cm with focal stricture but no signs of true obstruction. Greatest area of hyperenhancement noted along terminal ileum. Trace ascites and mesenteric stranding. Mild fatty liver infiltration. Patient's symptoms concerning for partial small bowel obstruction clinically. Patient seen in consultation by GI for Crohn's flare and patient started on IV steroids.

## 2023-01-15 DIAGNOSIS — R059 Cough, unspecified: Secondary | ICD-10-CM | POA: Diagnosis not present

## 2023-01-15 DIAGNOSIS — K50012 Crohn's disease of small intestine with intestinal obstruction: Secondary | ICD-10-CM | POA: Diagnosis not present

## 2023-01-28 DIAGNOSIS — E538 Deficiency of other specified B group vitamins: Secondary | ICD-10-CM | POA: Diagnosis not present

## 2023-01-29 DIAGNOSIS — K5000 Crohn's disease of small intestine without complications: Secondary | ICD-10-CM | POA: Diagnosis not present

## 2023-01-29 DIAGNOSIS — J40 Bronchitis, not specified as acute or chronic: Secondary | ICD-10-CM | POA: Diagnosis not present

## 2023-01-29 DIAGNOSIS — D84821 Immunodeficiency due to drugs: Secondary | ICD-10-CM | POA: Diagnosis not present

## 2023-02-05 DIAGNOSIS — K5000 Crohn's disease of small intestine without complications: Secondary | ICD-10-CM | POA: Diagnosis not present

## 2023-02-06 LAB — HM HEPATITIS C SCREENING LAB

## 2023-02-10 ENCOUNTER — Ambulatory Visit
Admission: RE | Admit: 2023-02-10 | Discharge: 2023-02-10 | Disposition: A | Discharge status: Home / Self Care | Payer: Medicare Other | Source: Ambulatory Visit | Attending: Internal Medicine | Admitting: Internal Medicine

## 2023-02-10 DIAGNOSIS — M8588 Other specified disorders of bone density and structure, other site: Secondary | ICD-10-CM | POA: Diagnosis not present

## 2023-02-10 DIAGNOSIS — Z780 Asymptomatic menopausal state: Secondary | ICD-10-CM | POA: Diagnosis not present

## 2023-02-10 DIAGNOSIS — M810 Age-related osteoporosis without current pathological fracture: Secondary | ICD-10-CM | POA: Diagnosis not present

## 2023-02-23 ENCOUNTER — Ambulatory Visit
Admission: RE | Admit: 2023-02-23 | Discharge: 2023-02-23 | Disposition: A | Discharge status: Home / Self Care | Payer: Medicare Other | Source: Ambulatory Visit | Attending: Internal Medicine | Admitting: Internal Medicine

## 2023-02-23 ENCOUNTER — Other Ambulatory Visit: Payer: Medicare Other

## 2023-02-23 DIAGNOSIS — Z1231 Encounter for screening mammogram for malignant neoplasm of breast: Secondary | ICD-10-CM

## 2023-02-24 DIAGNOSIS — E538 Deficiency of other specified B group vitamins: Secondary | ICD-10-CM | POA: Diagnosis not present

## 2023-02-24 DIAGNOSIS — D899 Disorder involving the immune mechanism, unspecified: Secondary | ICD-10-CM | POA: Diagnosis not present

## 2023-02-24 DIAGNOSIS — Z8616 Personal history of COVID-19: Secondary | ICD-10-CM | POA: Diagnosis not present

## 2023-02-24 DIAGNOSIS — I700 Atherosclerosis of aorta: Secondary | ICD-10-CM | POA: Diagnosis not present

## 2023-02-24 DIAGNOSIS — K5000 Crohn's disease of small intestine without complications: Secondary | ICD-10-CM | POA: Diagnosis not present

## 2023-02-26 ENCOUNTER — Ambulatory Visit: Payer: Medicare Other | Admitting: Internal Medicine

## 2023-02-26 ENCOUNTER — Encounter: Payer: Self-pay | Admitting: Internal Medicine

## 2023-02-26 ENCOUNTER — Other Ambulatory Visit: Payer: Self-pay

## 2023-02-26 ENCOUNTER — Ambulatory Visit
Admission: RE | Admit: 2023-02-26 | Discharge: 2023-02-26 | Disposition: A | Discharge status: Home / Self Care | Payer: Medicare Other | Source: Ambulatory Visit | Attending: Internal Medicine | Admitting: Internal Medicine

## 2023-02-26 VITALS — BP 121/74 | HR 76 | Temp 98.3°F | Ht 62.0 in | Wt 134.0 lb

## 2023-02-26 DIAGNOSIS — D849 Immunodeficiency, unspecified: Secondary | ICD-10-CM

## 2023-02-26 NOTE — Progress Notes (Signed)
Patient: Diane [name]  DOB: [birth year] MRN: [id number] PCP: Lorenda Ishihara, MD    Patient Active Problem List   Diagnosis Date Noted   Partial small bowel obstruction due to Crohn's ileitis 01/07/2023   Dehydration 01/04/2023   Degenerative joint disease of right hip 09/24/2021   Osteoarthritis of right hip 09/03/2021   Choledocholithiasis with obstruction 03/28/2017   Age-related osteoporosis without current pathological fracture 01/06/2017   High risk medication use 01/06/2017   Primary osteoarthritis of both knees 01/06/2017   Primary osteoarthritis of both hands 01/06/2017   Bilateral primary osteoarthritis of hip 01/06/2017   History of seizure 09/06/2014   Crohn's disease with flare 09/06/2014   Polypharmacy 09/06/2014   Delirium due to multiple etiologies, persistent, mixed level of activity    Malnutrition of moderate degree 08/29/2014   Mania 08/28/2014   Grand mal seizure 08/28/2014     Subjective:  Diane [name] is a 72 y.o. F with past medical history of Crohn's on Apriso followed by Dr. Ewing Schlein at [facility], arthritis, allergic rhinitis, endometriosis, SBO, seizure, steroid-induced psychosis and complication and stress urinary incontinence presents for positive QuantiFERON.  Patient was seen by GI on 02/05/2023 where it was noted that she had been sick for few days required hospital stay.  Given Solu-Medrol x 2 doses.  She was then put on Entocort since she was still having symptoms on steroids.  She had been on Humira in the past but did fine until shot stopped due to episode of shingles.  She had been seen at [facility] before and discussed possibly going back there for their opinion.  Referred hep B, hep C testing was done which are negative, QuantiFERON was done as there was plan to restart Humira which returned positive. On reveiwed of EMR: quantiferonnegative on 07/07/2013. 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  No nursing home exposure. No travel outside [place]. No known tb exposure Symptoms: She states she has been "dragging", she has had productive cough x  6months no consistent night sweat, has not recorded a fever.     Review of Systems  All other systems reviewed and are negative.   Past Medical History:  Diagnosis Date   Allergic rhinitis    Arthritis    Crohn disease    Crohn's disease    Crohn's disease    H/O bladder problems    History of endometriosis    History of small bowel obstruction    2011--  [facility] --  RESOLVED -- NO SURGICAL INTERVENTION   Osteoporosis    Pelvic prolapse    PONV (postoperative nausea and vomiting)    AND HARD TO WAKE   Seizure    hx of 2015 not followed by a neurologist , steroid induced by pt   Steroid-induced psychosis with complication (HCC) not followed by a neurologist    with hx of seizure - 2015   SUI (stress urinary incontinence, female)     Outpatient Medications Prior to Visit  Medication Sig Dispense Refill   acetaminophen (TYLENOL) 500 MG tablet Take 1,000 mg by mouth every 8 (eight) hours as needed for mild pain.     budesonide (ENTOCORT EC) 3 MG 24 hr capsule Take 3 capsules (9 mg) by mouth daily for 14 days; then take 2 capsules (6 mg) by mouth for 7 days, then take 1 capsule (3 mg) by  mouth for 7 days then stop 63 capsule 0   Calcium Carbonate-Vit D-Min (CALCIUM 600+D3 PLUS MINERALS) 600-800 MG-UNIT TABS Take 2 tablets by mouth daily.     ondansetron (ZOFRAN) 4 MG tablet Take 1 tablet (4 mg total) by mouth every 6 (six) hours as needed for nausea. 20 tablet 0   Polyethyl Glycol-Propyl Glycol (SYSTANE OP) Place 1-3 drops into both eyes 2 (two) times daily.     Probiotic Product (ALIGN) 4 MG CAPS Take 4 mg by mouth at bedtime.     No facility-administered medications prior to visit.     Allergies  Allergen Reactions   Celebrex [Celecoxib] Anaphylaxis   Prednisone Other (See  Comments)    Severe psychosis requiring hospitalization.  Avoid use if possible.     Actonel [Risedronate Sodium] Other (See Comments)    GI UPSET   Albuterol Other (See Comments)    sobbing   Ammonium-Containing Compounds     Unknown reaction   Amoxicillin Diarrhea and Nausea And Vomiting   Boniva [Ibandronic Acid] Other (See Comments)    GI UPSET   Chantix [Varenicline] Other (See Comments)    MOOD CHANGE   Fosamax [Alendronate Sodium] Other (See Comments)    GI UPSET   Imuran [Azathioprine] Diarrhea and Nausea And Vomiting   Nitrofuran Derivatives Other (See Comments)    CHILLS AND FEVER   Penicillins Diarrhea, Nausea And Vomiting and Other (See Comments)   Prolia [Denosumab] Other (See Comments)    Irritates chrons   Tobradex [Tobramycin-Dexamethasone] Other (See Comments)    SEVERE EYE IRRIATION   Vagifem [Estradiol] Other (See Comments)    bleeding   Neosporin [Neomycin-Bacitracin Zn-Polymyx] Rash    Social History   Tobacco Use   Smoking status: Former    Packs/day: 0.75    Years: 40.00    Additional pack years: 0.00    Total pack years: 30.00    Types: Cigarettes   Smokeless tobacco: Never  Vaping Use   Vaping Use: Never used  Substance Use Topics   Alcohol use: Not Currently    Alcohol/week: 7.0 standard drinks of alcohol    Types: 7 Glasses of wine per week    Comment: not in years   Drug use: Never    Family History  Problem Relation Age of Onset   Dementia Mother    Stroke Mother     Objective:  There were no vitals filed for this visit. There is no height or weight on file to calculate BMI.  Physical Exam Constitutional:      Appearance: Normal appearance.  HENT:     Head: Normocephalic and atraumatic.     Right Ear: Tympanic membrane normal.     Left Ear: Tympanic membrane normal.     Nose: Nose normal.     Mouth/Throat:     Mouth: Mucous membranes are moist.  Eyes:     Extraocular Movements: Extraocular movements intact.      Conjunctiva/sclera: Conjunctivae normal.     Pupils: Pupils are equal, round, and reactive to light.  Cardiovascular:     Rate and Rhythm: Normal rate and regular rhythm.     Heart sounds: No murmur heard.    No friction rub. No gallop.  Pulmonary:     Effort: Pulmonary effort is normal.     Breath sounds: Normal breath sounds.  Abdominal:     General: Abdomen is flat.     Palpations: Abdomen is soft.  Musculoskeletal:  General: Normal range of motion.  Skin:    General: Skin is warm and dry.  Neurological:     General: No focal deficit present.     Mental Status: She is alert and oriented to person, place, and time.  Psychiatric:        Mood and Affect: Mood normal.    Lab Results: Lab Results  Component Value Date   WBC 7.9 01/07/2023   HGB 11.6 (L) 01/07/2023   HCT 35.4 (L) 01/07/2023   MCV 94.1 01/07/2023   PLT 251 01/07/2023    Lab Results  Component Value Date   CREATININE 0.72 01/07/2023   BUN <5 (L) 01/07/2023   NA 136 01/07/2023   K 3.6 01/07/2023   CL 105 01/07/2023   CO2 25 01/07/2023    Lab Results  Component Value Date   ALT 9 01/02/2023   AST 17 01/02/2023   ALKPHOS 63 01/02/2023   BILITOT 0.6 01/02/2023     Assessment & Plan:  #Positive IGRA #Crohn's on Apriso #Multiple drug allergies -She had a previous negative IGRA in 0214 and placed on humira for Crohn's. Devloped a severe case of shingles and was taken off of it. Followed by GI, Dr. Ewing Schlein with plan to start Humira again for crohn's. IGRA was done as part of screening which returned positive. 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 -She has had a productive cough for months which seems most c/w seasonal allergies. She reports chills. No weight loss.   Plan: -HCV Ab and HBV Ag negative on 4/4 -Will get HAV, HBV c and Sab today. Reports she had HepB immunization -Repeat IGRA  -CXR -F/U in one week   Danelle Earthly, MD [facility]   02/26/23  8:45 AM   I have personally spent 88 minutes involved in face-to-face and non-face-to-face activities for this patient on the day of the visit. Professional time spent includes the following activities: Preparing to see the patient (review of tests), Obtaining and/or reviewing separately obtained history (admission/discharge record), Performing a medically appropriate examination and/or evaluation , Ordering medications/tests/procedures, referring and communicating with other health care professionals, Documenting clinical information in the EMR, Independently interpreting results (not separately reported), Communicating results to the patient/family/caregiver, Counseling and educating the patient/family/caregiver and Care coordination (not separately reported).

## 2023-02-28 LAB — QUANTIFERON-TB GOLD PLUS
Mitogen-NIL: 3.54 IU/mL
NIL: 0.03 IU/mL
QuantiFERON-TB Gold Plus: NEGATIVE
TB1-NIL: 0 IU/mL
TB2-NIL: 0 IU/mL

## 2023-02-28 LAB — HEPATITIS B CORE ANTIBODY, TOTAL: Hep B Core Total Ab: NONREACTIVE

## 2023-02-28 LAB — HEPATITIS B SURFACE ANTIBODY,QUALITATIVE: Hep B S Ab: NONREACTIVE

## 2023-02-28 LAB — HEPATITIS A ANTIBODY, TOTAL: Hepatitis A AB,Total: NONREACTIVE

## 2023-03-03 ENCOUNTER — Telehealth: Payer: Self-pay

## 2023-03-03 NOTE — Telephone Encounter (Signed)
Patient called requesting lab and x-ray results. Also wanted to know if she needed to schedule a follow up appointment.    Chaos Carlile Lesli Albee, CMA

## 2023-03-04 NOTE — Telephone Encounter (Signed)
Per Dr.Singh - TB - negative, CXR - negative; can get hep A and B immunization at PCP office.    Patient aware and voiced her understanding.    Diane [name], CMA

## 2023-03-10 DIAGNOSIS — M810 Age-related osteoporosis without current pathological fracture: Secondary | ICD-10-CM | POA: Diagnosis not present

## 2023-03-10 DIAGNOSIS — Z23 Encounter for immunization: Secondary | ICD-10-CM | POA: Diagnosis not present

## 2023-03-10 DIAGNOSIS — K5000 Crohn's disease of small intestine without complications: Secondary | ICD-10-CM | POA: Diagnosis not present

## 2023-03-12 NOTE — Telephone Encounter (Signed)
Patient called stating that Dr.Magod office told her to contact our office to see if she needed to wait after completion of hep a and b vaccines to start back Humira. Patient has already received first vaccine.  Per Dr.Singh - that would be up to Dr.Magod as we don't prescribe Humira to patient. The vaccine is not a live vaccine and shouldn't interact with medication but it would be up to the prescribing provider whether or not patient should wait to take Humira.   I relayed message to patient - she voiced her understanding.    Sabrine Patchen Lesli Albee, CMA

## 2023-04-23 IMAGING — DX DG PORTABLE PELVIS
1 series · 1 of 1 positions shown · non-contrast
Comparison: CT abdomen pelvis 05/15/2020

CLINICAL DATA: Degenerative joint disease of right hip

EXAM:
PORTABLE PELVIS 1-2 VIEWS; DG HIP (WITH OR WITHOUT PELVIS) 1V PORT
RIGHT

[pelvis ap]
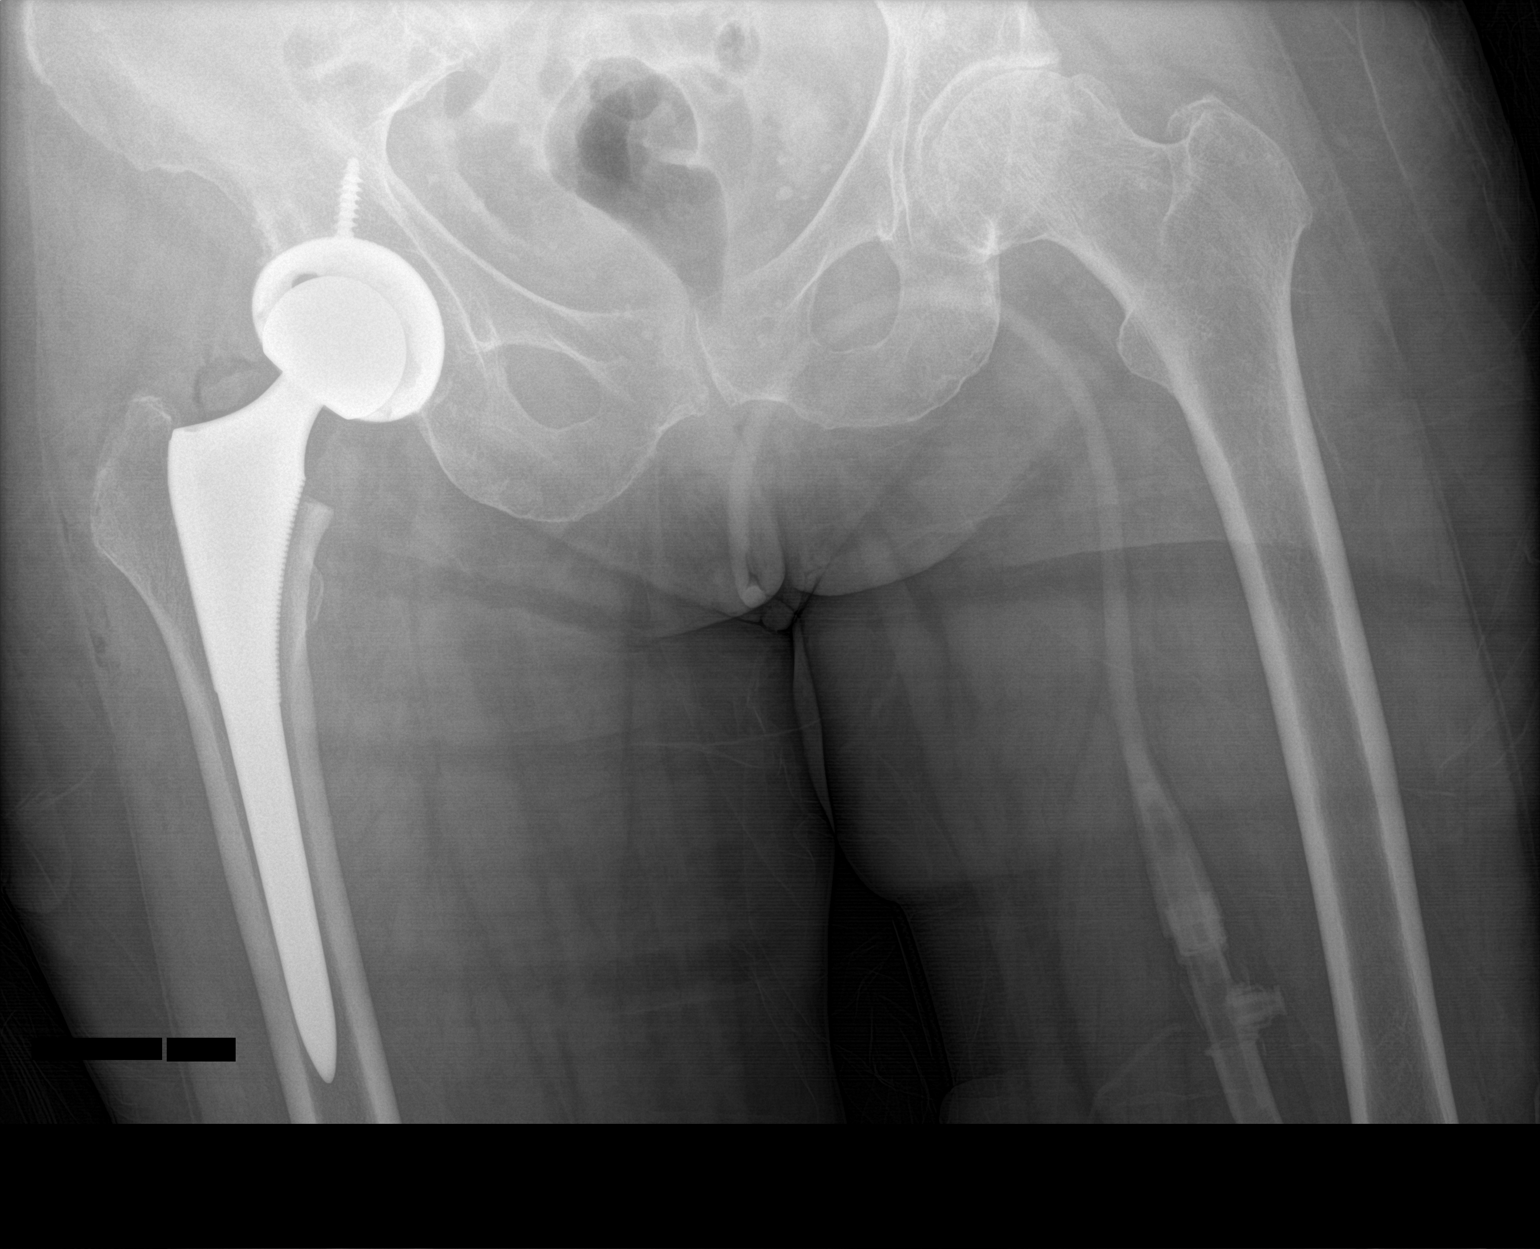

[1 of 1 positions shown; findings below may reference images not displayed]

FINDINGS: Status post total right hip arthroplasty. There is no evidence of
new pelvic fracture or diastasis. No new fracture or dislocation of
the hips. Right hip subcutaneus soft tissues edema and emphysema
consistent with postsurgical changes.
IMPRESSION: Status post total right hip arthroplasty.

## 2023-04-23 IMAGING — DX DG HIP (WITH OR WITHOUT PELVIS) 1V PORT*R*
1 series · 1 of 1 positions shown · non-contrast
Comparison: CT abdomen pelvis 05/15/2020

CLINICAL DATA: Degenerative joint disease of right hip

EXAM:
PORTABLE PELVIS 1-2 VIEWS; DG HIP (WITH OR WITHOUT PELVIS) 1V PORT
RIGHT

[hip frog leg]
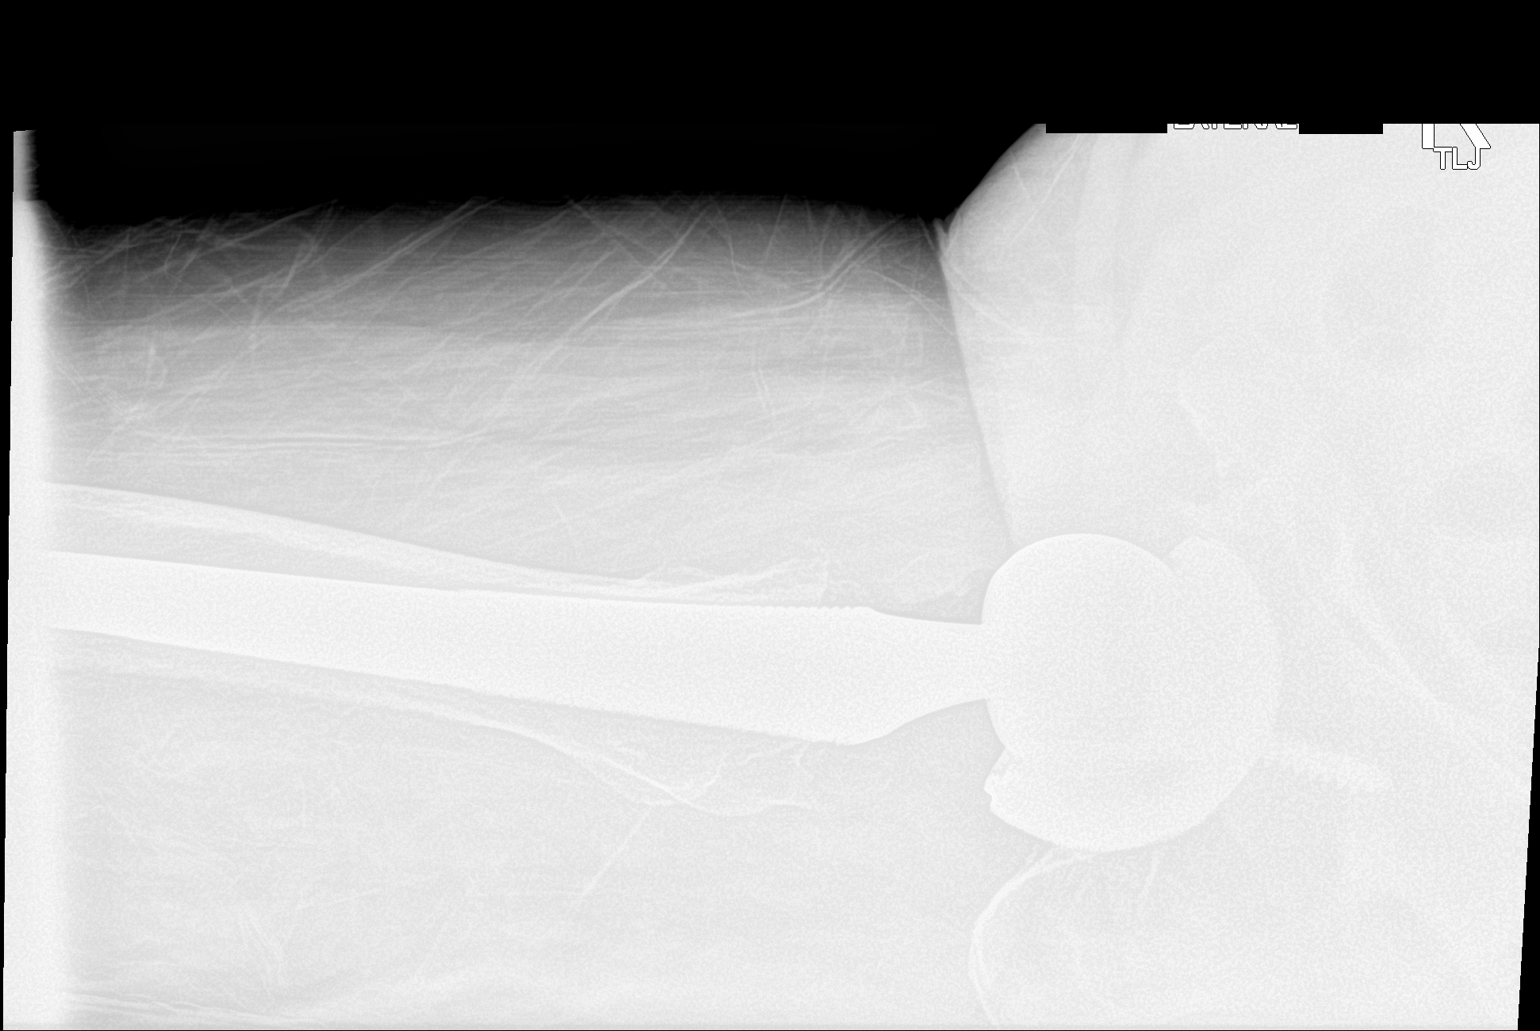

[1 of 1 positions shown; findings below may reference images not displayed]

FINDINGS: Status post total right hip arthroplasty. There is no evidence of
new pelvic fracture or diastasis. No new fracture or dislocation of
the hips. Right hip subcutaneus soft tissues edema and emphysema
consistent with postsurgical changes.
IMPRESSION: Status post total right hip arthroplasty.

## 2023-06-12 DIAGNOSIS — K5000 Crohn's disease of small intestine without complications: Secondary | ICD-10-CM | POA: Diagnosis not present

## 2023-07-17 DIAGNOSIS — Z23 Encounter for immunization: Secondary | ICD-10-CM | POA: Diagnosis not present

## 2023-09-09 DIAGNOSIS — M1710 Unilateral primary osteoarthritis, unspecified knee: Secondary | ICD-10-CM | POA: Diagnosis not present

## 2023-09-09 DIAGNOSIS — Z9049 Acquired absence of other specified parts of digestive tract: Secondary | ICD-10-CM | POA: Diagnosis not present

## 2023-09-09 DIAGNOSIS — E785 Hyperlipidemia, unspecified: Secondary | ICD-10-CM | POA: Diagnosis not present

## 2023-09-09 DIAGNOSIS — M810 Age-related osteoporosis without current pathological fracture: Secondary | ICD-10-CM | POA: Diagnosis not present

## 2023-09-09 DIAGNOSIS — M1610 Unilateral primary osteoarthritis, unspecified hip: Secondary | ICD-10-CM | POA: Diagnosis not present

## 2023-09-09 DIAGNOSIS — K5000 Crohn's disease of small intestine without complications: Secondary | ICD-10-CM | POA: Diagnosis not present

## 2023-09-09 DIAGNOSIS — Z0000 Encounter for general adult medical examination without abnormal findings: Secondary | ICD-10-CM | POA: Diagnosis not present

## 2023-09-09 DIAGNOSIS — G629 Polyneuropathy, unspecified: Secondary | ICD-10-CM | POA: Diagnosis not present

## 2023-09-10 DIAGNOSIS — Z23 Encounter for immunization: Secondary | ICD-10-CM | POA: Diagnosis not present

## 2023-09-15 ENCOUNTER — Other Ambulatory Visit: Payer: Self-pay | Admitting: Gastroenterology

## 2023-09-15 DIAGNOSIS — K5000 Crohn's disease of small intestine without complications: Secondary | ICD-10-CM

## 2023-09-18 ENCOUNTER — Ambulatory Visit
Admission: RE | Admit: 2023-09-18 | Discharge: 2023-09-18 | Disposition: A | Discharge status: Home / Self Care | Payer: Medicare Other | Source: Ambulatory Visit | Attending: Gastroenterology | Admitting: Gastroenterology

## 2023-09-18 DIAGNOSIS — K5000 Crohn's disease of small intestine without complications: Secondary | ICD-10-CM

## 2023-09-18 DIAGNOSIS — K5730 Diverticulosis of large intestine without perforation or abscess without bleeding: Secondary | ICD-10-CM | POA: Diagnosis not present

## 2023-09-18 MED ORDER — IOPAMIDOL (ISOVUE-300) INJECTION 61%
100.0000 mL | Freq: Once | INTRAVENOUS | Status: AC | PRN
  Administered 2023-09-18: 85 mL via INTRAVENOUS

## 2023-11-30 DIAGNOSIS — H25813 Combined forms of age-related cataract, bilateral: Secondary | ICD-10-CM | POA: Diagnosis not present

## 2023-11-30 DIAGNOSIS — H524 Presbyopia: Secondary | ICD-10-CM | POA: Diagnosis not present

## 2023-11-30 DIAGNOSIS — H52223 Regular astigmatism, bilateral: Secondary | ICD-10-CM | POA: Diagnosis not present

## 2023-11-30 DIAGNOSIS — H5203 Hypermetropia, bilateral: Secondary | ICD-10-CM | POA: Diagnosis not present

## 2023-12-23 DIAGNOSIS — K5000 Crohn's disease of small intestine without complications: Secondary | ICD-10-CM | POA: Diagnosis not present

## 2024-02-15 ENCOUNTER — Other Ambulatory Visit: Payer: Self-pay | Admitting: Internal Medicine

## 2024-02-15 DIAGNOSIS — Z1231 Encounter for screening mammogram for malignant neoplasm of breast: Secondary | ICD-10-CM

## 2024-02-24 ENCOUNTER — Ambulatory Visit
Admission: RE | Admit: 2024-02-24 | Discharge: 2024-02-24 | Disposition: A | Discharge status: Home / Self Care | Source: Ambulatory Visit | Attending: Internal Medicine | Admitting: Internal Medicine

## 2024-02-24 DIAGNOSIS — Z1231 Encounter for screening mammogram for malignant neoplasm of breast: Secondary | ICD-10-CM | POA: Diagnosis not present

## 2024-02-29 ENCOUNTER — Other Ambulatory Visit: Payer: Self-pay | Admitting: Internal Medicine

## 2024-02-29 DIAGNOSIS — R928 Other abnormal and inconclusive findings on diagnostic imaging of breast: Secondary | ICD-10-CM

## 2024-03-09 DIAGNOSIS — M810 Age-related osteoporosis without current pathological fracture: Secondary | ICD-10-CM | POA: Diagnosis not present

## 2024-03-09 DIAGNOSIS — E785 Hyperlipidemia, unspecified: Secondary | ICD-10-CM | POA: Diagnosis not present

## 2024-03-09 DIAGNOSIS — M1610 Unilateral primary osteoarthritis, unspecified hip: Secondary | ICD-10-CM | POA: Diagnosis not present

## 2024-03-09 DIAGNOSIS — Z96649 Presence of unspecified artificial hip joint: Secondary | ICD-10-CM | POA: Diagnosis not present

## 2024-03-09 DIAGNOSIS — M791 Myalgia, unspecified site: Secondary | ICD-10-CM | POA: Diagnosis not present

## 2024-03-09 DIAGNOSIS — L989 Disorder of the skin and subcutaneous tissue, unspecified: Secondary | ICD-10-CM | POA: Diagnosis not present

## 2024-03-10 ENCOUNTER — Ambulatory Visit
Admission: RE | Admit: 2024-03-10 | Discharge: 2024-03-10 | Disposition: A | Discharge status: Home / Self Care | Source: Ambulatory Visit | Attending: Internal Medicine | Admitting: Internal Medicine

## 2024-03-10 DIAGNOSIS — R928 Other abnormal and inconclusive findings on diagnostic imaging of breast: Secondary | ICD-10-CM

## 2024-03-10 DIAGNOSIS — R590 Localized enlarged lymph nodes: Secondary | ICD-10-CM | POA: Diagnosis not present

## 2024-04-26 DIAGNOSIS — M545 Low back pain, unspecified: Secondary | ICD-10-CM | POA: Diagnosis not present

## 2024-04-26 DIAGNOSIS — M160 Bilateral primary osteoarthritis of hip: Secondary | ICD-10-CM | POA: Diagnosis not present

## 2024-05-02 DIAGNOSIS — M545 Low back pain, unspecified: Secondary | ICD-10-CM | POA: Diagnosis not present

## 2024-05-16 DIAGNOSIS — L570 Actinic keratosis: Secondary | ICD-10-CM | POA: Diagnosis not present

## 2024-05-16 DIAGNOSIS — L814 Other melanin hyperpigmentation: Secondary | ICD-10-CM | POA: Diagnosis not present

## 2024-05-16 DIAGNOSIS — D492 Neoplasm of unspecified behavior of bone, soft tissue, and skin: Secondary | ICD-10-CM | POA: Diagnosis not present

## 2024-05-16 DIAGNOSIS — C44722 Squamous cell carcinoma of skin of right lower limb, including hip: Secondary | ICD-10-CM | POA: Diagnosis not present

## 2024-05-16 DIAGNOSIS — L821 Other seborrheic keratosis: Secondary | ICD-10-CM | POA: Diagnosis not present

## 2024-05-16 DIAGNOSIS — D225 Melanocytic nevi of trunk: Secondary | ICD-10-CM | POA: Diagnosis not present

## 2024-05-17 DIAGNOSIS — M545 Low back pain, unspecified: Secondary | ICD-10-CM | POA: Diagnosis not present

## 2024-05-30 DIAGNOSIS — M1612 Unilateral primary osteoarthritis, left hip: Secondary | ICD-10-CM | POA: Diagnosis not present

## 2024-06-09 DIAGNOSIS — C44722 Squamous cell carcinoma of skin of right lower limb, including hip: Secondary | ICD-10-CM | POA: Diagnosis not present

## 2024-07-06 DIAGNOSIS — Z860100 Personal history of colon polyps, unspecified: Secondary | ICD-10-CM | POA: Diagnosis not present

## 2024-07-06 DIAGNOSIS — K921 Melena: Secondary | ICD-10-CM | POA: Diagnosis not present

## 2024-07-22 DIAGNOSIS — L905 Scar conditions and fibrosis of skin: Secondary | ICD-10-CM | POA: Diagnosis not present

## 2024-08-24 NOTE — Progress Notes (Signed)
 Sent message, via epic in basket, requesting orders in epic from Careers adviser.

## 2024-08-30 NOTE — Patient Instructions (Signed)
 SURGICAL WAITING ROOM VISITATION  Patients having surgery or a procedure may have no more than 2 support people in the waiting area - these visitors may rotate.    Children under the age of 14 must have an adult with them who is not the patient.  Visitors with respiratory illnesses are discouraged from visiting and should remain at home.  If the patient needs to stay at the hospital during part of their recovery, the visitor guidelines for inpatient rooms apply. Pre-op nurse will coordinate an appropriate time for 1 support person to accompany patient in pre-op.  This support person may not rotate.    Please refer to the [facility] website for the visitor guidelines for Inpatients (after your surgery is over and you are in a regular room).       Your procedure is scheduled on: 09/13/24   Report to [facility] Main Entrance    Report to admitting at 6:15 AM   Call this number if you have problems the morning of surgery [phone]   Do not eat food :After Midnight.   After Midnight you may have the following liquids until 5:45 AM DAY OF SURGERY  Water  Non-Citrus Juices (without pulp, NO RED-Apple, White grape, White cranberry) Black Coffee (NO MILK/CREAM OR CREAMERS, sugar ok)  Clear Tea (NO MILK/CREAM OR CREAMERS, sugar ok) regular and decaf                             Plain Jell-O (NO RED)                                           Fruit ices (not with fruit pulp, NO RED)                                     Popsicles (NO RED)                                                               Sports drinks like Gatorade (NO RED)                 The day of surgery:  Drink ONE (1) Pre-Surgery Clear Ensure  at 5:45 AM the morning of surgery. Drink in one sitting. Do not sip.  This drink was given to you during your hospital  pre-op appointment visit. Nothing else to drink after completing the  Pre-Surgery Clear Ensure.    Oral Hygiene is also important to reduce your  risk of infection.                                    Remember - BRUSH YOUR TEETH THE MORNING OF SURGERY WITH YOUR REGULAR TOOTHPASTE  DENTURES WILL BE REMOVED PRIOR TO SURGERY PLEASE DO NOT APPLY Poly grip OR ADHESIVES!!!     Stop all vitamins and herbal supplements 7 days before surgery.   Take these medicines the morning of surgery with A SIP OF WATER : tylenol   You may not have any metal on your body including hair pins, jewelry, and body piercing             Do not wear make-up, lotions, powders, perfumes/cologne, or deodorant  Do not wear nail polish including gel and S&S, artificial/acrylic nails, or any other type of covering on natural nails including finger and toenails. If you have artificial nails, gel coating, etc. that needs to be removed by a nail salon please have this removed prior to surgery or surgery may need to be canceled/ delayed if the surgeon/ anesthesia feels like they are unable to be safely monitored.   Do not shave  48 hours prior to surgery.               Do not bring valuables to the hospital. [facility] IS NOT             RESPONSIBLE   FOR VALUABLES.   Contacts, glasses, dentures or bridgework may not be worn into surgery.   Bring small overnight bag day of surgery.   DO NOT BRING YOUR HOME MEDICATIONS TO THE HOSPITAL. PHARMACY WILL DISPENSE MEDICATIONS LISTED ON YOUR MEDICATION LIST TO YOU DURING YOUR ADMISSION IN THE HOSPITAL!    Patients discharged on the day of surgery will not be allowed to drive home.  Someone NEEDS to stay with you for the first 24 hours after anesthesia.   Special Instructions: Bring a copy of your healthcare power of attorney and living will documents the day of surgery if you haven't scanned them before.              Please read over the following fact sheets you were given: IF YOU HAVE QUESTIONS ABOUT YOUR PRE-OP INSTRUCTIONS PLEASE [phone] [place]   If you received a COVID test during your pre-op  visit  it is requested that you wear a mask when out in public, stay away from anyone that may not be feeling well and notify your surgeon if you develop symptoms. If you test positive for Covid or have been in contact with anyone that has tested positive in the last 10 days please notify you surgeon.      Pre-operative 4 CHG Bath Instructions  DYNA-Hex 4 Chlorhexidine  Gluconate 4% Solution Antiseptic 4 fl. oz   You can play a key role in reducing the risk of infection after surgery. Your skin needs to be as free of germs as possible. You can reduce the number of germs on your skin by washing with CHG (chlorhexidine  gluconate) soap before surgery. CHG is an antiseptic soap that kills germs and continues to kill germs even after washing.   DO NOT use if you have an allergy to chlorhexidine /CHG or antibacterial soaps. If your skin becomes reddened or irritated, stop using the CHG and notify one of our RNs at   Please shower with the CHG soap starting 4 days before surgery using the following schedule:     Please keep in mind the following:  DO NOT shave, including legs and underarms, starting the day of your first shower.   You may shave your face at any point before/day of surgery.  Place clean sheets on your bed the day you start using CHG soap. Use a clean washcloth (not used since being washed) for each shower. DO NOT sleep with pets once you start using the CHG.  CHG Shower Instructions:  If you choose to wash your hair and private area, wash first with your normal  shampoo/soap.  After you use shampoo/soap, rinse your hair and body thoroughly to remove shampoo/soap residue.  Turn the water  OFF and apply about 3 tablespoons (45 ml) of CHG soap to a CLEAN washcloth.  Apply CHG soap ONLY FROM YOUR NECK DOWN TO YOUR TOES (washing for 3-5 minutes)  DO NOT use CHG soap on face, private areas, open wounds, or sores.  Pay special attention to the area where your surgery is being performed.   If you are having back surgery, having someone wash your back for you may be helpful. Wait 2 minutes after CHG soap is applied, then you may rinse off the CHG soap.  Pat dry with a clean towel  Put on clean clothes/pajamas   If you choose to wear lotion, please use ONLY the CHG-compatible lotions on the back of this paper.     Additional instructions for the day of surgery: DO NOT APPLY any lotions, deodorants, cologne, or perfumes.   Put on clean/comfortable clothes.  Brush your teeth.  Ask your nurse before applying any prescription medications to the skin.   CHG Compatible Lotions   Aveeno Moisturizing lotion  Cetaphil Moisturizing Cream  Cetaphil Moisturizing Lotion  Clairol Herbal Essence Moisturizing Lotion, Dry Skin  Clairol Herbal Essence Moisturizing Lotion, Extra Dry Skin  Clairol Herbal Essence Moisturizing Lotion, Normal Skin  Curel Age Defying Therapeutic Moisturizing Lotion with Alpha Hydroxy  Curel Extreme Care Body Lotion  Curel Soothing Hands Moisturizing Hand Lotion  Curel Therapeutic Moisturizing Cream, Fragrance-Free  Curel Therapeutic Moisturizing Lotion, Fragrance-Free  Curel Therapeutic Moisturizing Lotion, Original Formula  Eucerin Daily Replenishing Lotion  Eucerin Dry Skin Therapy Plus Alpha Hydroxy Crme  Eucerin Dry Skin Therapy Plus Alpha Hydroxy Lotion  Eucerin Original Crme  Eucerin Original Lotion  Eucerin Plus Crme Eucerin Plus Lotion  Eucerin TriLipid Replenishing Lotion  Keri Anti-Bacterial Hand Lotion  Keri Deep Conditioning Original Lotion Dry Skin Formula Softly Scented  Keri Deep Conditioning Original Lotion, Fragrance Free Sensitive Skin Formula  Keri Lotion Fast Absorbing Fragrance Free Sensitive Skin Formula  Keri Lotion Fast Absorbing Softly Scented Dry Skin Formula  Keri Original Lotion  Keri Skin Renewal Lotion Keri Silky Smooth Lotion  Keri Silky Smooth Sensitive Skin Lotion  Nivea Body Creamy Conditioning Oil  Nivea Body  Extra Enriched Lotion  Nivea Body Original Lotion  Nivea Body Sheer Moisturizing Lotion Nivea Crme  Nivea Skin Firming Lotion  NutraDerm 30 Skin Lotion  NutraDerm Skin Lotion  NutraDerm Therapeutic Skin Cream  NutraDerm Therapeutic Skin Lotion  ProShield Protective Hand Cream  Incentive Spirometer  An incentive spirometer is a tool that can help keep your lungs clear and active. This tool measures how well you are filling your lungs with each breath. Taking long deep breaths may help reverse or decrease the chance of developing breathing (pulmonary) problems (especially infection) following: A long period of time when you are unable to move or be active. BEFORE THE PROCEDURE  If the spirometer includes an indicator to show your best effort, your nurse or respiratory therapist will set it to a desired goal. If possible, sit up straight or lean slightly forward. Try not to slouch. Hold the incentive spirometer in an upright position. INSTRUCTIONS FOR USE  Sit on the edge of your bed if possible, or sit up as far as you can in bed or on a chair. Hold the incentive spirometer in an upright position. Breathe out normally. Place the mouthpiece in your mouth and seal your lips tightly around it.  Breathe in slowly and as deeply as possible, raising the piston or the ball toward the top of the column. Hold your breath for 3-5 seconds or for as long as possible. Allow the piston or ball to fall to the bottom of the column. Remove the mouthpiece from your mouth and breathe out normally. Rest for a few seconds and repeat Steps 1 through 7 at least 10 times every 1-2 hours when you are awake. Take your time and take a few normal breaths between deep breaths. The spirometer may include an indicator to show your best effort. Use the indicator as a goal to work toward during each repetition. After each set of 10 deep breaths, practice coughing to be sure your lungs are clear. If you have an incision  (the cut made at the time of surgery), support your incision when coughing by placing a pillow or rolled up towels firmly against it. Once you are able to get out of bed, walk around indoors and cough well. You may stop using the incentive spirometer when instructed by your caregiver.  RISKS AND COMPLICATIONS Take your time so you do not get dizzy or light-headed. If you are in pain, you may need to take or ask for pain medication before doing incentive spirometry. It is harder to take a deep breath if you are having pain. AFTER USE Rest and breathe slowly and easily. It can be helpful to keep track of a log of your progress. Your caregiver can provide you with a simple table to help with this. If you are using the spirometer at home, follow these instructions: SEEK MEDICAL CARE IF:  You are having difficultly using the spirometer. You have trouble using the spirometer as often as instructed. Your pain medication is not giving enough relief while using the spirometer. You develop fever of 100.5 F (38.1 C) or higher. SEEK IMMEDIATE MEDICAL CARE IF:  You cough up bloody sputum that had not been present before. You develop fever of 102 F (38.9 C) or greater. You develop worsening pain at or near the incision site. MAKE SURE YOU:  Understand these instructions. Will watch your condition. Will get help right away if you are not doing well or get worse.   WHAT IS A BLOOD TRANSFUSION? Blood Transfusion Information  A transfusion is the replacement of blood or some of its parts. Blood is made up of multiple cells which provide different functions. Red blood cells carry oxygen and are used for blood loss replacement. White blood cells fight against infection. Platelets control bleeding. Plasma helps clot blood. Other blood products are available for specialized needs, such as hemophilia or other clotting disorders. BEFORE THE TRANSFUSION  Who gives blood for transfusions?  Healthy  volunteers who are fully evaluated to make sure their blood is safe. This is blood bank blood. Transfusion therapy is the safest it has ever been in the practice of medicine. Before blood is taken from a donor, a complete history is taken to make sure that person has no history of diseases nor engages in risky social behavior (examples are intravenous drug use or sexual activity with multiple partners). The donor's travel history is screened to minimize risk of transmitting infections, such as malaria. The donated blood is tested for signs of infectious diseases, such as HIV and hepatitis. The blood is then tested to be sure it is compatible with you in order to minimize the chance of a transfusion reaction. If you or a relative donates blood, this  is often done in anticipation of surgery and is not appropriate for emergency situations. It takes many days to process the donated blood. RISKS AND COMPLICATIONS Although transfusion therapy is very safe and saves many lives, the main dangers of transfusion include:  Getting an infectious disease. Developing a transfusion reaction. This is an allergic reaction to something in the blood you were given. Every precaution is taken to prevent this. The decision to have a blood transfusion has been considered carefully by your caregiver before blood is given. Blood is not given unless the benefits outweigh the risks. AFTER THE TRANSFUSION Right after receiving a blood transfusion, you will usually feel much better and more energetic. This is especially true if your red blood cells have gotten low (anemic). The transfusion raises the level of the red blood cells which carry oxygen, and this usually causes an energy increase. The nurse administering the transfusion will monitor you carefully for complications. HOME CARE INSTRUCTIONS  No special instructions are needed after a transfusion. You may find your energy is better. Speak with your caregiver about any  limitations on activity for underlying diseases you may have. SEEK MEDICAL CARE IF:  Your condition is not improving after your transfusion. You develop redness or irritation at the intravenous (IV) site. SEEK IMMEDIATE MEDICAL CARE IF:  Any of the following symptoms occur over the next 12 hours: Shaking chills. You have a temperature by mouth above 102 F (38.9 C), not controlled by medicine. Chest, back, or muscle pain. People around you feel you are not acting correctly or are confused. Shortness of breath or difficulty breathing. Dizziness and fainting. You get a rash or develop hives. You have a decrease in urine output. Your urine turns a dark color or changes to pink, red, or brown. Any of the following symptoms occur over the next 10 days: You have a temperature by mouth above 102 F (38.9 C), not controlled by medicine. Shortness of breath. Weakness after normal activity. The white part of the eye turns yellow (jaundice). You have a decrease in the amount of urine or are urinating less often. Your urine turns a dark color or changes to pink, red, or brown. Document Released: 10/17/2000 Document Revised: 01/12/2012 Document Reviewed: 06/05/2008 [facility] Patient Information 2014 [place], [place].

## 2024-08-30 NOTE — Progress Notes (Signed)
 COVID Vaccine received:  []  No [x]  Yes Date of any COVID positive Test in last 90 days: no PCP - Lorenda Ishihara MD Cardiologist - no  Chest x-ray -  EKG -   Stress Test -  ECHO -  Cardiac Cath -   Bowel Prep - [x]  No  []   Yes ______  Pacemaker / ICD device [x]  No []  Yes   Spinal Cord Stimulator:[x]  No []  Yes       History of Sleep Apnea? [x]  No []  Yes   CPAP used?- [x]  No []  Yes    Does the patient monitor blood sugar?          [x]  No []  Yes  []  N/A  Patient has: [x]  NO Hx DM   []  Pre-DM                 []  DM1  []   DM2 Does patient have a [employer] or Dexacom? []  No []  Yes   Fasting Blood Sugar Ranges-  Checks Blood Sugar _____ times a day  GLP1 agonist / usual dose - no GLP1 instructions:  SGLT-2 inhibitors / usual dose - no SGLT-2 instructions:   Blood Thinner / Instructions:no Aspirin Instructions:no no Comments:   Activity level: Patient is able to climb a flight of stairs without difficulty; [x]  No CP  [x]  No SOB, ___   Patient can / perform ADLs without assistance.   Anesthesia review:   Patient denies shortness of breath, fever, cough and chest pain at PAT appointment.  Patient verbalized understanding and agreement to the Pre-Surgical Instructions that were given to them at this PAT appointment. Patient was also educated of the need to review these PAT instructions again prior to his/her surgery.I reviewed the appropriate phone numbers to call if they have any and questions or concerns.

## 2024-08-31 ENCOUNTER — Encounter (HOSPITAL_COMMUNITY): Payer: Self-pay

## 2024-08-31 ENCOUNTER — Encounter (HOSPITAL_COMMUNITY)
Admission: RE | Admit: 2024-08-31 | Discharge: 2024-08-31 | Disposition: A | Discharge status: Home / Self Care | Source: Ambulatory Visit | Attending: Orthopedic Surgery | Admitting: Orthopedic Surgery

## 2024-08-31 ENCOUNTER — Other Ambulatory Visit: Payer: Self-pay

## 2024-08-31 VITALS — BP 129/73 | HR 82 | Temp 98.1°F | Resp 16 | Ht 62.0 in | Wt 136.0 lb

## 2024-08-31 DIAGNOSIS — Z01812 Encounter for preprocedural laboratory examination: Secondary | ICD-10-CM | POA: Diagnosis present

## 2024-08-31 DIAGNOSIS — K50919 Crohn's disease, unspecified, with unspecified complications: Secondary | ICD-10-CM | POA: Insufficient documentation

## 2024-08-31 DIAGNOSIS — Z6824 Body mass index (BMI) 24.0-24.9, adult: Secondary | ICD-10-CM | POA: Insufficient documentation

## 2024-08-31 DIAGNOSIS — Z01818 Encounter for other preprocedural examination: Secondary | ICD-10-CM

## 2024-08-31 DIAGNOSIS — E440 Moderate protein-calorie malnutrition: Secondary | ICD-10-CM | POA: Diagnosis not present

## 2024-08-31 HISTORY — DX: Malignant (primary) neoplasm, unspecified: C80.1

## 2024-08-31 LAB — TYPE AND SCREEN
ABO/RH(D): A POS
Antibody Screen: NEGATIVE

## 2024-08-31 LAB — CBC
HCT: 40.1 % (ref 36.0–46.0)
Hemoglobin: 12.2 g/dL (ref 12.0–15.0)
MCH: 28.5 pg (ref 26.0–34.0)
MCHC: 30.4 g/dL (ref 30.0–36.0)
MCV: 93.7 fL (ref 80.0–100.0)
Platelets: 355 K/uL (ref 150–400)
RBC: 4.28 MIL/uL (ref 3.87–5.11)
RDW: 13.3 % (ref 11.5–15.5)
WBC: 8.3 K/uL (ref 4.0–10.5)
nRBC: 0 % (ref 0.0–0.2)

## 2024-08-31 LAB — BASIC METABOLIC PANEL WITH GFR
Anion gap: 9 (ref 5–15)
BUN: 11 mg/dL (ref 8–23)
CO2: 27 mmol/L (ref 22–32)
Calcium: 10 mg/dL (ref 8.9–10.3)
Chloride: 103 mmol/L (ref 98–111)
Creatinine, Ser: 0.72 mg/dL (ref 0.44–1.00)
GFR, Estimated: >60 mL/min (ref >60)
Glucose, Bld: 91 mg/dL (ref 70–99)
Potassium: 4.2 mmol/L (ref 3.5–5.1)
Sodium: 139 mmol/L (ref 135–145)

## 2024-08-31 LAB — SURGICAL PCR SCREEN
MRSA, PCR: NEGATIVE
Staphylococcus aureus: POSITIVE — AB

## 2024-09-02 NOTE — Progress Notes (Signed)
 Request sent to Dr. DOROTHA Olmsted to review pt's pre op PCR result from 08/31/24.

## 2024-09-02 NOTE — Care Plan (Signed)
 Ortho Bundle Case Management Note  Patient Details  Name: Diane [name] MRN: [id number] Date of Birth: January 11, 1951  patient met with PA in the office for H&P. will discharge to home with family to assist. had DME. HHPT referral to [facility] and OPPT set up with [facility]. discharge instructions discussed and CM spoke with her on the phone today. all questions answered. Patient and MD in agreement with plan. Choice offered.                     DME Arranged:    DME Agency:     HH Arranged:  PT HH Agency:  [facility] ([facility]  Additional Comments: Please contact me with any questions of if this plan should need to change.  Charlies Pitch,  RN,BSN,MHA,CCM  [facility]  [phone] 09/02/2024, 4:24 PM

## 2024-09-05 NOTE — H&P (Signed)
 HIP ARTHROPLASTY ADMISSION H&P  Patient ID: Diane [name] MRN: [id number] DOB/AGE: 05-May-1951 73 y.o.  Chief Complaint: left hip pain.  Planned Procedure Date: 09/13/24 Medical Clearance by Dr. Varadarajan   Additional clearance by Dr. Rosalie (GI), Dr. Robina (dermatology)   HPI: Diane [name] is a 73 y.o. female who presents for evaluation of osteoarthritis of left hip. The patient has a history of pain and functional disability in the left hip due to arthritis and has failed non-surgical conservative treatments for greater than 12 weeks to include NSAID's and/or analgesics, corticosteriod injections, and activity modification.  Onset of symptoms was abrupt, starting 1 years ago with rapidlly worsening course since that time. The patient noted no past surgery on the left hip.  Patient currently rates pain at moderate to severe with activity. Patient has worsening of pain with activity and weight bearing and pain that interferes with activities of daily living.  Patient has evidence of joint space narrowing by imaging studies.  There is no active infection.  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    with hx of seizure - 2015   SUI (stress urinary incontinence, female)    Past Surgical History:  Procedure Laterality Date   4 repair of prolapse      ABDOMINAL HYSTERECTOMY  1980   ANTERIOR AND POSTERIOR REPAIR  06/13/2013   Procedure:   AUGUEMENTED  RECTOCELE REPAIR WITH XENFORM;  Surgeon: Arlena LILLETTE Gal, MD;  Location: [facility] [place];  Service: Urology;;   BENIGN EXCISION RIGHT BREAST CYST  1996   BREAST EXCISIONAL BIOPSY Right 1996   CHOLECYSTECTOMY N/A 03/30/2017   Procedure: LAPAROSCOPIC CHOLECYSTECTOMY;  Surgeon: Ebbie Cough, MD;  Location: [facility];  Service: General;  Laterality: N/A;   CYSTOSCOPY N/A 06/13/2013   Procedure: PHYLLIS;  Surgeon: Arlena LILLETTE Gal, MD;  Location: [facility];  Service: Urology;  Laterality: N/A;   ENDOSCOPIC RETROGRADE CHOLANGIOPANCREATOGRAPHY (ERCP) WITH PROPOFOL  N/A 03/29/2017   Procedure: ENDOSCOPIC RETROGRADE CHOLANGIOPANCREATOGRAPHY (ERCP) WITH PROPOFOL ;  Surgeon: Saintclair Jasper, MD;  Location: [facility];  Service: Gastroenterology;  Laterality: N/A;  prone position   EXPLORATORY LAPAROTOMY  EARLY 1970'S   INFERLITITY   EXPLORATORY LAPAROTOMY     endometriosis   LAPAROSCOPY/ LYSIS ADHESIONS/ LEFT SALPINGOOPHORECTOMY/ RIGHT OVARIAN CYSTECTOMY  06/08/2000   PUBOVAGINAL SLING N/A 06/13/2013   Procedure: PELVIC FLOOR REPAIR  ,  WITH  UPHOLD  , ANTERIOR VAULT WITH KELLY PLICATION WITH LYNX SLING;  Surgeon: Arlena LILLETTE Gal, MD;  Location: [facility];  Service: Urology;  Laterality: N/A;   TOTAL HIP ARTHROPLASTY Right 09/24/2021   Procedure: TOTAL HIP ARTHROPLASTY;  Surgeon: Josefina Chew, MD;  Location: [facility];  Service: Orthopedics;  Laterality: Right;   WISDOM TOOTH EXTRACTION     Allergies  Allergen Reactions   Celebrex [Celecoxib] Anaphylaxis   Prednisone  Other (See Comments)    Severe psychosis requiring hospitalization.  Avoid use if possible.     Actonel [Risedronate Sodium] Other (See Comments)    GI UPSET   Albuterol Other (See Comments)    sobbing   Ammonium-Containing Compounds     Unknown reaction   Amoxicillin Diarrhea and Nausea And Vomiting   Boniva [Ibandronate] Other (See Comments)    GI UPSET    Calcitonin Other (See Comments)    unknown reaction   Chantix [Varenicline] Other (See Comments)    MOOD CHANGE   Fosamax [Alendronate Sodium] Other (See Comments)    GI UPSET   Imuran [Azathioprine] Diarrhea and Nausea And Vomiting   Nitrofuran Derivatives Other (See Comments)    CHILLS AND FEVER   Penicillins Diarrhea, Nausea And Vomiting and Other (See Comments)   Prolia [Denosumab] Other (See Comments)    Irritates chrons   Tobradex [Tobramycin-Dexamethasone ] Other (See Comments)    SEVERE EYE IRRIATION   Vagifem  [Estradiol ] Other (See Comments)    bleeding   Wound Dressing Adhesive Other (See Comments)    skin tears   Neosporin [Neomycin-Bacitracin Zn-Polymyx] Rash   Prior to Admission medications   Medication Sig Start Date End Date Taking? Authorizing Provider  acetaminophen  (TYLENOL ) 500 MG tablet Take 1,000 mg by mouth every 8 (eight) hours as needed for mild pain.   Yes [provider]  Calcium Carbonate-Vit D-Min (CALCIUM 600+D3 PLUS MINERALS) 600-800 MG-UNIT TABS Take 2 tablets by mouth in the morning.   Yes [provider]  mesalamine  (APRISO ) 0.375 g 24 hr capsule Take 1.5 g by mouth every morning. 02/05/23  Yes [provider]  Polyethyl Glycol-Propyl Glycol (SYSTANE OP) Place 1-3 drops into both eyes 2 (two) times daily.   Yes [provider]  Probiotic Product (ALIGN PO) Take 1 capsule by mouth at bedtime.   Yes [provider]   Social History   Socioeconomic History   Marital status: Married    Spouse name: Not on file   Number of children: Not on file   Years of education: Not on file   Highest education level: Not on file  Occupational History   Not on file  Tobacco Use   Smoking status: Former    Current packs/day: 0.75    Average packs/day: 0.8 packs/day for 40.0 years (30.0 ttl pk-yrs)    Types: Cigarettes   Smokeless tobacco: Never  Vaping Use   Vaping status: Never Used  Substance and Sexual Activity    Alcohol  use: Not Currently    Alcohol /week: 7.0 standard drinks of alcohol     Types: 7 Glasses of wine per week    Comment: not in years   Drug use: Never   Sexual activity: Not on file  Other Topics Concern   Not on file  Social History Narrative   Not on file   Social Drivers of Health   Financial Resource Strain: Not on file  Food Insecurity: Patient Declined (01/03/2023)   Hunger Vital Sign    Worried About Running Out of Food in the Last Year: Patient declined    Ran Out of Food in the Last Year: Patient declined  Transportation Needs: No Transportation Needs (01/03/2023)   PRAPARE - Administrator, Civil Service (Medical): No    Lack of Transportation (Non-Medical): No  Physical Activity: Not on file  Stress: Not on file  Social Connections: Not on file  Family History  Problem Relation Age of Onset   Dementia Mother    Stroke Mother    BRCA 1/2 Neg Hx    Breast cancer Neg Hx     ROS: Currently denies lightheadedness, dizziness, Fever, chills, CP, SOB.   No personal history of DVT, PE, MI, or CVA. No loose teeth or dentures All other systems have been reviewed and were otherwise currently negative with the exception of those mentioned in the HPI and as above.  Objective: Height: 5' 2  Weight: 139.1 Physical Exam: General: Alert, NAD.  HEENT: EOMI, Good Neck Extension  Pulm: No increased work of breathing.   CV: RRR, GI: soft, NT, ND Neuro: Neuro without gross focal deficit.  Sensation intact distally Skin: No lesions in the area of chief complaint MSK/Surgical Site: 0 to 80 degrees of forward flexion of the left hip. Pain with active internal and external rotation. Dorsiflexion plantarflexion intact at the ankle. Distal sensation is intact. 2+ PT pulse.  Imaging Review Plain radiographs demonstrate severe degenerative joint disease of the left hip.   Preoperative templating of the joint replacement has been completed, documented, and submitted to  the Operating Room personnel in order to optimize intra-operative equipment management.  Assessment: osteoarthritis of left hip  Plan: Plan for Procedure(s): ARTHROPLASTY, HIP, TOTAL,POSTERIOR APPROACH  The patient history, physical exam, clinical judgement of the provider and imaging are consistent with end stage degenerative joint disease and total joint arthroplasty is deemed medically necessary. The treatment options including medical management, injection therapy, and arthroplasty were discussed at length. The risks and benefits of Procedure(s): ARTHROPLASTY, HIP, TOTAL,POSTERIOR APPROACH were presented and reviewed.  The risks of nonoperative treatment, versus surgical intervention including but not limited to continued pain, aseptic loosening, stiffness, dislocation/subluxation, infection, bleeding, nerve injury, blood clots, cardiopulmonary complications, morbidity, mortality, among others were discussed. The patient verbalizes understanding and wishes to proceed with the plan.  Patient is being admitted for surgery, pain control, PT, prophylactic antibiotics, VTE prophylaxis, progressive ambulation, ADL's and discharge planning.    The patient does meet the criteria for TXA which will be used perioperatively.   Xarelto  10 mg  will be used postoperatively for DVT prophylaxis in addition to SCDs, and early ambulation. Please see case management note regarding therapy and discharge plan   Vennela Jutte K Geovannie Vilar, PA-C 09/05/2024 2:57 PM

## 2024-09-12 NOTE — Progress Notes (Signed)
 1800 Returned call new surgery time given for tomorrow, 1319, to arrive at 1050 in Admitting.  No food after midnight and take medication and drink pre surgery drink by 1000.  She was able to repeat the new instructions without questions.

## 2024-09-13 ENCOUNTER — Observation Stay (HOSPITAL_COMMUNITY)

## 2024-09-13 ENCOUNTER — Ambulatory Visit (HOSPITAL_COMMUNITY): Admitting: Anesthesiology

## 2024-09-13 ENCOUNTER — Other Ambulatory Visit: Payer: Self-pay

## 2024-09-13 ENCOUNTER — Encounter (HOSPITAL_COMMUNITY): Payer: Self-pay | Admitting: Orthopedic Surgery

## 2024-09-13 ENCOUNTER — Observation Stay (HOSPITAL_COMMUNITY)
Admission: RE | Admit: 2024-09-13 | Discharge: 2024-09-14 | Disposition: A | Attending: Orthopedic Surgery | Admitting: Orthopedic Surgery

## 2024-09-13 ENCOUNTER — Ambulatory Visit (HOSPITAL_COMMUNITY): Payer: Self-pay | Admitting: Medical

## 2024-09-13 ENCOUNTER — Encounter (HOSPITAL_COMMUNITY)
Admission: RE | Disposition: A | Discharge status: Home Health | Payer: Self-pay | Source: Home / Self Care | Attending: Orthopedic Surgery

## 2024-09-13 DIAGNOSIS — Z87891 Personal history of nicotine dependence: Secondary | ICD-10-CM | POA: Diagnosis not present

## 2024-09-13 DIAGNOSIS — Z85828 Personal history of other malignant neoplasm of skin: Secondary | ICD-10-CM | POA: Diagnosis not present

## 2024-09-13 DIAGNOSIS — M1612 Unilateral primary osteoarthritis, left hip: Secondary | ICD-10-CM | POA: Diagnosis not present

## 2024-09-13 DIAGNOSIS — M25552 Pain in left hip: Secondary | ICD-10-CM | POA: Diagnosis present

## 2024-09-13 DIAGNOSIS — Z96642 Presence of left artificial hip joint: Principal | ICD-10-CM | POA: Diagnosis present

## 2024-09-13 HISTORY — PX: TOTAL HIP ARTHROPLASTY: SHX124

## 2024-09-13 LAB — ABO/RH: ABO/RH(D): A POS

## 2024-09-13 SURGERY — ARTHROPLASTY, HIP, TOTAL,POSTERIOR APPROACH
Anesthesia: General | Site: Hip | Laterality: Left

## 2024-09-13 MED ORDER — TRANEXAMIC ACID-NACL 1000-0.7 MG/100ML-% IV SOLN
1000.0000 mg | Freq: Once | INTRAVENOUS | Status: AC
  Administered 2024-09-13: 1000 mg via INTRAVENOUS
  Filled 2024-09-13: qty 100

## 2024-09-13 MED ORDER — POTASSIUM CHLORIDE IN NACL 20-0.9 MEQ/L-% IV SOLN
INTRAVENOUS | Status: DC
  Filled 2024-09-13 (×2): qty 1000

## 2024-09-13 MED ORDER — TRANEXAMIC ACID-NACL 1000-0.7 MG/100ML-% IV SOLN
1000.0000 mg | INTRAVENOUS | Status: AC
  Administered 2024-09-13: 1000 mg via INTRAVENOUS
  Filled 2024-09-13: qty 100

## 2024-09-13 MED ORDER — POVIDONE-IODINE 10 % EX SWAB: 2.0000 | Freq: Once | CUTANEOUS | Status: DC

## 2024-09-13 MED ORDER — CHLORHEXIDINE GLUCONATE 0.12 % MT SOLN
15.0000 mL | Freq: Once | OROMUCOSAL | Status: AC
  Administered 2024-09-13: 15 mL via OROMUCOSAL

## 2024-09-13 MED ORDER — STERILE WATER FOR IRRIGATION IR SOLN
Status: DC | PRN
  Administered 2024-09-13: 2000 mL

## 2024-09-13 MED ORDER — ACETAMINOPHEN 500 MG PO TABS
1000.0000 mg | ORAL_TABLET | Freq: Four times a day (QID) | ORAL | Status: DC
  Administered 2024-09-13 – 2024-09-14 (×2): 1000 mg via ORAL
  Filled 2024-09-13 (×2): qty 2

## 2024-09-13 MED ORDER — LIDOCAINE HCL (CARDIAC) PF 100 MG/5ML IV SOSY
PREFILLED_SYRINGE | INTRAVENOUS | Status: DC | PRN
  Administered 2024-09-13: 40 mg via INTRAVENOUS

## 2024-09-13 MED ORDER — POLYETHYL GLYCOL-PROPYL GLYCOL 0.4-0.3 % OP GEL: Freq: Two times a day (BID) | OPHTHALMIC | Status: DC

## 2024-09-13 MED ORDER — ONDANSETRON HCL 4 MG/2ML IJ SOLN
INTRAMUSCULAR | Status: DC | PRN
  Administered 2024-09-13: 4 mg via INTRAVENOUS

## 2024-09-13 MED ORDER — EPHEDRINE SULFATE (PRESSORS) 25 MG/5ML IV SOSY
PREFILLED_SYRINGE | INTRAVENOUS | Status: DC | PRN
  Administered 2024-09-13: 5 mg via INTRAVENOUS
  Administered 2024-09-13: 10 mg via INTRAVENOUS

## 2024-09-13 MED ORDER — OXYCODONE HCL 5 MG PO TABS
5.0000 mg | ORAL_TABLET | ORAL | Status: DC | PRN
  Administered 2024-09-14 (×3): 5 mg via ORAL
  Filled 2024-09-13 (×3): qty 1

## 2024-09-13 MED ORDER — LACTATED RINGERS IV SOLN: INTRAVENOUS | Status: DC

## 2024-09-13 MED ORDER — RISAQUAD PO CAPS
1.0000 | ORAL_CAPSULE | Freq: Every day | ORAL | Status: DC
  Administered 2024-09-13: 1 via ORAL
  Filled 2024-09-13: qty 1

## 2024-09-13 MED ORDER — MENTHOL 3 MG MT LOZG: 1.0000 | LOZENGE | OROMUCOSAL | Status: DC | PRN

## 2024-09-13 MED ORDER — PHENYLEPHRINE HCL-NACL 20-0.9 MG/250ML-% IV SOLN
INTRAVENOUS | Status: DC | PRN
  Administered 2024-09-13: 40 ug/min via INTRAVENOUS

## 2024-09-13 MED ORDER — METOCLOPRAMIDE HCL 5 MG PO TABS: 5.0000 mg | ORAL_TABLET | Freq: Three times a day (TID) | ORAL | Status: DC | PRN

## 2024-09-13 MED ORDER — OXYCODONE HCL 5 MG PO TABS
ORAL_TABLET | ORAL | Status: AC
  Filled 2024-09-13: qty 1

## 2024-09-13 MED ORDER — ONDANSETRON HCL 4 MG/2ML IJ SOLN
INTRAMUSCULAR | Status: AC
Start: 2024-09-13 — End: 2024-09-13
  Filled 2024-09-13: qty 2

## 2024-09-13 MED ORDER — BISACODYL 10 MG RE SUPP: 10.0000 mg | Freq: Every day | RECTAL | Status: DC | PRN

## 2024-09-13 MED ORDER — PHENYLEPHRINE HCL (PRESSORS) 10 MG/ML IV SOLN
INTRAVENOUS | Status: DC | PRN
  Administered 2024-09-13: 120 ug via INTRAVENOUS
  Administered 2024-09-13: 80 ug via INTRAVENOUS
  Administered 2024-09-13: 100 ug via INTRAVENOUS

## 2024-09-13 MED ORDER — METOCLOPRAMIDE HCL 5 MG/ML IJ SOLN
5.0000 mg | Freq: Three times a day (TID) | INTRAMUSCULAR | Status: DC | PRN
  Administered 2024-09-13: 10 mg via INTRAVENOUS
  Filled 2024-09-13: qty 2

## 2024-09-13 MED ORDER — PHENYLEPHRINE 80 MCG/ML (10ML) SYRINGE FOR IV PUSH (FOR BLOOD PRESSURE SUPPORT)
PREFILLED_SYRINGE | INTRAVENOUS | Status: AC
  Filled 2024-09-13: qty 10

## 2024-09-13 MED ORDER — OXYCODONE HCL 5 MG/5ML PO SOLN: 5.0000 mg | Freq: Once | ORAL | Status: AC | PRN

## 2024-09-13 MED ORDER — ACETAMINOPHEN 500 MG PO TABS: 1000.0000 mg | ORAL_TABLET | Freq: Once | ORAL | Status: AC

## 2024-09-13 MED ORDER — ONDANSETRON HCL 4 MG/2ML IJ SOLN: 4.0000 mg | Freq: Once | INTRAMUSCULAR | Status: DC | PRN

## 2024-09-13 MED ORDER — FENTANYL CITRATE (PF) 100 MCG/2ML IJ SOLN
INTRAMUSCULAR | Status: AC
  Filled 2024-09-13: qty 2

## 2024-09-13 MED ORDER — AMISULPRIDE (ANTIEMETIC) 5 MG/2ML IV SOLN: 10.0000 mg | Freq: Once | INTRAVENOUS | Status: DC | PRN

## 2024-09-13 MED ORDER — OXYCODONE HCL 5 MG PO TABS
5.0000 mg | ORAL_TABLET | Freq: Once | ORAL | Status: AC | PRN
  Administered 2024-09-13: 5 mg via ORAL

## 2024-09-13 MED ORDER — MESALAMINE ER 0.375 G PO CP24: 1.5000 g | ORAL_CAPSULE | Freq: Every morning | ORAL | Status: DC

## 2024-09-13 MED ORDER — CHLORHEXIDINE GLUCONATE 4 % EX SOLN: 1.0000 | CUTANEOUS | 1 refills | Status: AC

## 2024-09-13 MED ORDER — KETOROLAC TROMETHAMINE 30 MG/ML IJ SOLN
INTRAMUSCULAR | Status: AC
Start: 2024-09-13 — End: 2024-09-13
  Filled 2024-09-13: qty 1

## 2024-09-13 MED ORDER — PHENYLEPHRINE HCL (PRESSORS) 10 MG/ML IV SOLN
INTRAVENOUS | Status: AC
  Filled 2024-09-13: qty 1

## 2024-09-13 MED ORDER — DOCUSATE SODIUM 100 MG PO CAPS
100.0000 mg | ORAL_CAPSULE | Freq: Two times a day (BID) | ORAL | Status: DC
  Administered 2024-09-13 – 2024-09-14 (×2): 100 mg via ORAL
  Filled 2024-09-13 (×2): qty 1

## 2024-09-13 MED ORDER — DIPHENHYDRAMINE HCL 12.5 MG/5ML PO ELIX: 12.5000 mg | ORAL_SOLUTION | ORAL | Status: DC | PRN

## 2024-09-13 MED ORDER — EPHEDRINE 5 MG/ML INJ
INTRAVENOUS | Status: AC
  Filled 2024-09-13: qty 5

## 2024-09-13 MED ORDER — ACETAMINOPHEN 325 MG PO TABS: 325.0000 mg | ORAL_TABLET | Freq: Four times a day (QID) | ORAL | Status: DC | PRN

## 2024-09-13 MED ORDER — POLYETHYLENE GLYCOL 3350 17 G PO PACK: 17.0000 g | PACK | Freq: Every day | ORAL | Status: DC | PRN

## 2024-09-13 MED ORDER — ZOLPIDEM TARTRATE 5 MG PO TABS
5.0000 mg | ORAL_TABLET | Freq: Every evening | ORAL | Status: DC | PRN
Start: 2024-09-13 — End: 2024-09-14

## 2024-09-13 MED ORDER — MAGNESIUM CITRATE PO SOLN
1.0000 | Freq: Once | ORAL | Status: DC | PRN
Start: 2024-09-13 — End: 2024-09-14

## 2024-09-13 MED ORDER — METHOCARBAMOL 1000 MG/10ML IJ SOLN: 500.0000 mg | Freq: Four times a day (QID) | INTRAMUSCULAR | Status: DC | PRN

## 2024-09-13 MED ORDER — HYDROMORPHONE HCL 1 MG/ML IJ SOLN
INTRAMUSCULAR | Status: DC | PRN
  Administered 2024-09-13: .25 mg via INTRAVENOUS
  Administered 2024-09-13: .5 mg via INTRAVENOUS
  Administered 2024-09-13: .25 mg via INTRAVENOUS

## 2024-09-13 MED ORDER — ACETAMINOPHEN 500 MG PO TABS
1000.0000 mg | ORAL_TABLET | Freq: Once | ORAL | Status: AC
  Administered 2024-09-13: 1000 mg via ORAL
  Filled 2024-09-13: qty 2

## 2024-09-13 MED ORDER — METHOCARBAMOL 500 MG PO TABS
500.0000 mg | ORAL_TABLET | Freq: Four times a day (QID) | ORAL | Status: DC | PRN
Start: 2024-09-13 — End: 2024-09-14
  Administered 2024-09-13 – 2024-09-14 (×3): 500 mg via ORAL
  Filled 2024-09-13 (×3): qty 1

## 2024-09-13 MED ORDER — FENTANYL CITRATE (PF) 100 MCG/2ML IJ SOLN
INTRAMUSCULAR | Status: DC | PRN
  Administered 2024-09-13 (×2): 50 ug via INTRAVENOUS

## 2024-09-13 MED ORDER — PROPOFOL 1000 MG/100ML IV EMUL
INTRAVENOUS | Status: AC
  Filled 2024-09-13: qty 100

## 2024-09-13 MED ORDER — PROMETHAZINE HCL 6.25 MG/5ML PO SOLN
25.0000 mg | Freq: Four times a day (QID) | ORAL | Status: DC | PRN
  Administered 2024-09-13: 25 mg via ORAL
  Filled 2024-09-13 (×2): qty 20

## 2024-09-13 MED ORDER — MESALAMINE ER 250 MG PO CPCR
1500.0000 mg | ORAL_CAPSULE | Freq: Every day | ORAL | Status: DC
  Administered 2024-09-14: 1500 mg via ORAL
  Filled 2024-09-13: qty 6

## 2024-09-13 MED ORDER — ORAL CARE MOUTH RINSE: 15.0000 mL | Freq: Once | OROMUCOSAL | Status: AC

## 2024-09-13 MED ORDER — 0.9 % SODIUM CHLORIDE (POUR BTL) OPTIME
TOPICAL | Status: DC | PRN
  Administered 2024-09-13: 1000 mL

## 2024-09-13 MED ORDER — BUPIVACAINE HCL (PF) 0.25 % IJ SOLN
INTRAMUSCULAR | Status: DC | PRN
Start: 2024-09-13 — End: 2024-09-13
  Administered 2024-09-13: 30 mL

## 2024-09-13 MED ORDER — ALIGN PO CHEW: 1.0000 | CHEWABLE_TABLET | Freq: Every day | ORAL | Status: DC

## 2024-09-13 MED ORDER — MUPIROCIN 2 % EX OINT: 1.0000 | TOPICAL_OINTMENT | Freq: Two times a day (BID) | CUTANEOUS | 0 refills | Status: AC

## 2024-09-13 MED ORDER — PROPOFOL 500 MG/50ML IV EMUL
INTRAVENOUS | Status: DC | PRN
  Administered 2024-09-13: 50 ug/kg/min via INTRAVENOUS
  Administered 2024-09-13: 110 mg via INTRAVENOUS

## 2024-09-13 MED ORDER — HYDROMORPHONE HCL 1 MG/ML IJ SOLN: 0.5000 mg | INTRAMUSCULAR | Status: DC | PRN

## 2024-09-13 MED ORDER — POVIDONE-IODINE 7.5 % EX SOLN: Freq: Once | CUTANEOUS | Status: DC

## 2024-09-13 MED ORDER — RIVAROXABAN 10 MG PO TABS
10.0000 mg | ORAL_TABLET | Freq: Every day | ORAL | Status: DC
  Administered 2024-09-14: 10 mg via ORAL
  Filled 2024-09-13: qty 1

## 2024-09-13 MED ORDER — ARTIFICIAL TEARS OPHTHALMIC OINT
TOPICAL_OINTMENT | Freq: Two times a day (BID) | OPHTHALMIC | Status: DC
  Filled 2024-09-13: qty 3.5

## 2024-09-13 MED ORDER — CEFAZOLIN SODIUM-DEXTROSE 2-4 GM/100ML-% IV SOLN
2.0000 g | Freq: Four times a day (QID) | INTRAVENOUS | Status: AC
  Administered 2024-09-13 – 2024-09-14 (×2): 2 g via INTRAVENOUS
  Filled 2024-09-13 (×2): qty 100

## 2024-09-13 MED ORDER — OXYCODONE HCL 5 MG PO TABS: 10.0000 mg | ORAL_TABLET | ORAL | Status: DC | PRN

## 2024-09-13 MED ORDER — PROMETHAZINE HCL 25 MG PO TABS: 25.0000 mg | ORAL_TABLET | Freq: Four times a day (QID) | ORAL | Status: DC | PRN

## 2024-09-13 MED ORDER — ALUM & MAG HYDROXIDE-SIMETH 200-200-20 MG/5ML PO SUSP: 30.0000 mL | ORAL | Status: DC | PRN

## 2024-09-13 MED ORDER — CEFAZOLIN SODIUM-DEXTROSE 2-4 GM/100ML-% IV SOLN
2.0000 g | INTRAVENOUS | Status: AC
  Administered 2024-09-13: 2 g via INTRAVENOUS
  Filled 2024-09-13: qty 100

## 2024-09-13 MED ORDER — HYDROMORPHONE HCL 1 MG/ML IJ SOLN
0.2500 mg | INTRAMUSCULAR | Status: DC | PRN
  Administered 2024-09-13 (×2): 0.5 mg via INTRAVENOUS
  Filled 2024-09-13: qty 1

## 2024-09-13 MED ORDER — BUPIVACAINE HCL (PF) 0.25 % IJ SOLN
INTRAMUSCULAR | Status: AC
  Filled 2024-09-13: qty 30

## 2024-09-13 MED ORDER — LIDOCAINE HCL (PF) 2 % IJ SOLN
INTRAMUSCULAR | Status: AC
  Filled 2024-09-13: qty 5

## 2024-09-13 MED ORDER — PHENOL 1.4 % MT LIQD: 1.0000 | OROMUCOSAL | Status: DC | PRN

## 2024-09-13 MED ORDER — DEXMEDETOMIDINE HCL IN NACL 80 MCG/20ML IV SOLN
INTRAVENOUS | Status: DC | PRN
  Administered 2024-09-13 (×2): 4 ug via INTRAVENOUS

## 2024-09-13 MED ORDER — HYDROMORPHONE HCL 2 MG/ML IJ SOLN
INTRAMUSCULAR | Status: AC
  Filled 2024-09-13: qty 1

## 2024-09-13 SURGICAL SUPPLY — 50 items
ANCHOR SUT KEITH ABD SZ2 STR (SUTURE) ×2 IMPLANT
BAG COUNTER SPONGE SURGICOUNT (BAG) IMPLANT
BIT DRILL 2.0X128 (BIT) ×2 IMPLANT
BLADE SAW SGTL 73X25 THK (BLADE) ×2 IMPLANT
CLSR STERI-STRIP ANTIMIC 1/2X4 (GAUZE/BANDAGES/DRESSINGS) ×4 IMPLANT
COVER SURGICAL LIGHT HANDLE (MISCELLANEOUS) ×2 IMPLANT
DRAPE INCISE IOBAN 66X45 STRL (DRAPES) ×2 IMPLANT
DRAPE POUCH INSTRU U-SHP 10X18 (DRAPES) ×2 IMPLANT
DRAPE SHEET LG 3/4 BI-LAMINATE (DRAPES) ×2 IMPLANT
DRAPE SURG 17X11 SM STRL (DRAPES) ×2 IMPLANT
DRAPE SURG ORHT 6 SPLT 77X108 (DRAPES) ×4 IMPLANT
DRAPE U-SHAPE 47X51 STRL (DRAPES) ×2 IMPLANT
DRSG MEPILEX POST OP 4X12 (GAUZE/BANDAGES/DRESSINGS) ×2 IMPLANT
DURAPREP 26ML APPLICATOR (WOUND CARE) ×4 IMPLANT
ELECT BLADE TIP CTD 4 INCH (ELECTRODE) ×2 IMPLANT
ELECT PENCIL ROCKER SW 15FT (MISCELLANEOUS) ×2 IMPLANT
ELECT REM PT RETURN 15FT ADLT (MISCELLANEOUS) ×2 IMPLANT
ELIMINATOR HOLE APEX DEPUY (Hips) IMPLANT
FACESHIELD WRAPAROUND OR TEAM (MASK) ×2 IMPLANT
GLOVE BIO SURGEON STRL SZ 6.5 (GLOVE) ×2 IMPLANT
GLOVE BIO SURGEON STRL SZ7.5 (GLOVE) ×2 IMPLANT
GLOVE BIOGEL PI IND STRL 7.0 (GLOVE) ×2 IMPLANT
GLOVE BIOGEL PI IND STRL 8 (GLOVE) ×2 IMPLANT
GOWN STRL SURGICAL XL XLNG (GOWN DISPOSABLE) ×4 IMPLANT
HEAD CERAMIC 36 PLUS5 (Hips) IMPLANT
HOOD PEEL AWAY T7 (MISCELLANEOUS) ×6 IMPLANT
KIT BASIN OR (CUSTOM PROCEDURE TRAY) ×2 IMPLANT
KIT TURNOVER KIT A (KITS) ×2 IMPLANT
LINER NEUTRAL 52X36MM PLUS 4 (Liner) IMPLANT
MANIFOLD NEPTUNE II (INSTRUMENTS) ×2 IMPLANT
NDL SAFETY ECLIPSE 18X1.5 (NEEDLE) ×4 IMPLANT
NS IRRIG 1000ML POUR BTL (IV SOLUTION) ×2 IMPLANT
PACK TOTAL JOINT (CUSTOM PROCEDURE TRAY) ×2 IMPLANT
PIN SECTOR W/GRIP ACE CUP 52MM (Hips) IMPLANT
PROTECTOR NERVE ULNAR (MISCELLANEOUS) ×2 IMPLANT
SCREW 6.5MMX25MM (Screw) IMPLANT
STEM FEM ACTIS STD SZ7 (Nail) IMPLANT
SUCTION TUBE FRAZIER 12FR DISP (SUCTIONS) ×2 IMPLANT
SUT ETHIBOND NAB CT1 #1 30IN (SUTURE) ×6 IMPLANT
SUT STRATAFIX 14 PDO 48 VLT (SUTURE) ×2 IMPLANT
SUT VIC AB 1 CT1 36 (SUTURE) ×4 IMPLANT
SUT VIC AB 2-0 CT1 TAPERPNT 27 (SUTURE) ×4 IMPLANT
SUT VIC AB 3-0 SH 27X BRD (SUTURE) ×4 IMPLANT
SYR 30ML LL (SYRINGE) ×2 IMPLANT
SYR 3ML LL SCALE MARK (SYRINGE) ×2 IMPLANT
TOWEL GREEN STERILE FF (TOWEL DISPOSABLE) ×2 IMPLANT
TOWEL OR 17X26 10 PK STRL BLUE (TOWEL DISPOSABLE) ×2 IMPLANT
TRAY FOLEY MTR SLVR 16FR STAT (SET/KITS/TRAYS/PACK) ×2 IMPLANT
TUBE SUCTION HIGH CAP CLEAR NV (SUCTIONS) ×2 IMPLANT
WATER STERILE IRR 1000ML POUR (IV SOLUTION) ×4 IMPLANT

## 2024-09-13 NOTE — Anesthesia Procedure Notes (Signed)
 Procedure Name: LMA Insertion Date/Time: 09/13/2024 1:32 PM  Performed by: Kathern Rollene LABOR, CRNAPre-anesthesia Checklist: Patient identified, Emergency Drugs available, Suction available and Patient being monitored Patient Re-evaluated:Patient Re-evaluated prior to induction Oxygen Delivery Method: Circle system utilized Preoxygenation: Pre-oxygenation with 100% oxygen Induction Type: IV induction Ventilation: Mask ventilation without difficulty LMA: LMA inserted LMA Size: 3.0 Tube type: Oral Number of attempts: 1 Placement Confirmation: positive ETCO2 and breath sounds checked- equal and bilateral Tube secured with: Tape Dental Injury: Teeth and Oropharynx as per pre-operative assessment

## 2024-09-13 NOTE — Discharge Instructions (Addendum)
 INSTRUCTIONS AFTER JOINT REPLACEMENT   Remove items at home which could result in a fall. This includes throw rugs or furniture in walking pathways ICE to the affected joint every three hours while awake for 30 minutes at a time, for at least the first 3-5 days, and then as needed for pain and swelling.  Continue to use ice for pain and swelling. You may notice swelling that will progress down to the foot and ankle.  This is normal after surgery.  Elevate your leg when you are not up walking on it.   Continue to use the breathing machine you got in the hospital (incentive spirometer) which will help keep your temperature down.  It is common for your temperature to cycle up and down following surgery, especially at night when you are not up moving around and exerting yourself.  The breathing machine keeps your lungs expanded and your temperature down.   DIET:  As you were doing prior to hospitalization, we recommend a well-balanced diet.  DRESSING / WOUND CARE / SHOWERING  You may shower 3 days after surgery, but keep the wounds dry during showering.  You may use an occlusive plastic wrap (Press'n Seal for example), NO SOAKING/SUBMERGING IN THE BATHTUB.  If the bandage gets wet, change with a clean dry gauze.  If the incision gets wet, pat the wound dry with a clean towel.  ACTIVITY  Increase activity slowly as tolerated, but follow the weight bearing instructions below.   No driving for 6 weeks or until further direction given by your physician.  You cannot drive while taking narcotics.  No lifting or carrying greater than 10 lbs. until further directed by your surgeon. Avoid periods of inactivity such as sitting longer than an hour when not asleep. This helps prevent blood clots.  You may return to work once you are authorized by your doctor.     WEIGHT BEARING   Weight bearing as tolerated with assist device (walker, cane, etc) as directed, use it as long as suggested by your surgeon or  therapist, typically at least 4-6 weeks.   EXERCISES  Results after joint replacement surgery are often greatly improved when you follow the exercise, range of motion and muscle strengthening exercises prescribed by your doctor. Safety measures are also important to protect the joint from further injury. Any time any of these exercises cause you to have increased pain or swelling, decrease what you are doing until you are comfortable again and then slowly increase them. If you have problems or questions, call your caregiver or physical therapist for advice.   Rehabilitation is important following a joint replacement. After just a few days of immobilization, the muscles of the leg can become weakened and shrink (atrophy).  These exercises are designed to build up the tone and strength of the thigh and leg muscles and to improve motion. Often times heat used for twenty to thirty minutes before working out will loosen up your tissues and help with improving the range of motion but do not use heat for the first two weeks following surgery (sometimes heat can increase post-operative swelling).   These exercises can be done on a training (exercise) mat, on the floor, on a table or on a bed. Use whatever works the best and is most comfortable for you.    Use music or television while you are exercising so that the exercises are a pleasant break in your day. This will make your life better with the exercises acting  as a break in your routine that you can look forward to.   Perform all exercises about fifteen times, three times per day or as directed.  You should exercise both the operative leg and the other leg as well.  Exercises include:   Quad Sets - Tighten up the muscle on the front of the thigh (Quad) and hold for 5-10 seconds.   Straight Leg Raises - With your knee straight (if you were given a brace, keep it on), lift the leg to 60 degrees, hold for 3 seconds, and slowly lower the leg.  Perform this  exercise against resistance later as your leg gets stronger.  Leg Slides: Lying on your back, slowly slide your foot toward your buttocks, bending your knee up off the floor (only go as far as is comfortable). Then slowly slide your foot back down until your leg is flat on the floor again.  Angel Wings: Lying on your back spread your legs to the side as far apart as you can without causing discomfort.  Hamstring Strength:  Lying on your back, push your heel against the floor with your leg straight by tightening up the muscles of your buttocks.  Repeat, but this time bend your knee to a comfortable angle, and push your heel against the floor.  You may put a pillow under the heel to make it more comfortable if necessary.   A rehabilitation program following joint replacement surgery can speed recovery and prevent re-injury in the future due to weakened muscles. Contact your doctor or a physical therapist for more information on knee rehabilitation.    CONSTIPATION  Constipation is defined medically as fewer than three stools per week and severe constipation as less than one stool per week.  Even if you have a regular bowel pattern at home, your normal regimen is likely to be disrupted due to multiple reasons following surgery.  Combination of anesthesia, postoperative narcotics, change in appetite and fluid intake all can affect your bowels.   YOU MUST use at least one of the following options; they are listed in order of increasing strength to get the job done.  They are all available over the counter, and you may need to use some, POSSIBLY even all of these options:    Drink plenty of fluids (prune juice may be helpful) and high fiber foods Colace 100 mg by mouth twice a day  Senokot for constipation as directed and as needed Dulcolax (bisacodyl ), take with full glass of water   Miralax (polyethylene glycol) once or twice a day as needed.  If you have tried all these things and are unable to have a  bowel movement in the first 3-4 days after surgery call either your surgeon or your primary doctor.    If you experience loose stools or diarrhea, hold the medications until you stool forms back up.  If your symptoms do not get better within 1 week or if they get worse, check with your doctor.  If you experience the worst abdominal pain ever or develop nausea or vomiting, please contact the office immediately for further recommendations for treatment.   ITCHING:  If you experience itching with your medications, try taking only a single pain pill, or even half a pain pill at a time.  You can also use Benadryl  over the counter for itching or also to help with sleep.   TED HOSE STOCKINGS:  Use stockings on both legs until for at least 2 weeks or as directed by  physician office. They may be removed at night for sleeping.  MEDICATIONS:  See your medication summary on the "After Visit Summary" that nursing will review with you.  You may have some home medications which will be placed on hold until you complete the course of blood thinner medication.  It is important for you to complete the blood thinner medication as prescribed.  PRECAUTIONS:  If you experience chest pain or shortness of breath - call 911 immediately for transfer to the hospital emergency department.   If you develop a fever greater that 101 F, purulent drainage from wound, increased redness or drainage from wound, foul odor from the wound/dressing, or calf pain - CONTACT YOUR SURGEON.                                                   FOLLOW-UP APPOINTMENTS:  If you do not already have a post-op appointment, please call the office for an appointment to be seen by your surgeon.  Guidelines for how soon to be seen are listed in your "After Visit Summary", but are typically between 1-4 weeks after surgery.  OTHER INSTRUCTIONS:    POST-OPERATIVE OPIOID TAPER INSTRUCTIONS: It is important to wean off of your opioid medication as soon as  possible. If you do not need pain medication after your surgery it is ok to stop day one. Opioids include: Codeine, Hydrocodone (Norco, Vicodin), Oxycodone (Percocet, oxycontin ) and hydromorphone  amongst others.  Long term and even short term use of opiods can cause: Increased pain response Dependence Constipation Depression Respiratory depression And more.  Withdrawal symptoms can include Flu like symptoms Nausea, vomiting And more Techniques to manage these symptoms Hydrate well Eat regular healthy meals Stay active Use relaxation techniques(deep breathing, meditating, yoga) Do Not substitute Alcohol  to help with tapering If you have been on opioids for less than two weeks and do not have pain than it is ok to stop all together.  Plan to wean off of opioids This plan should start within one week post op of your joint replacement. Maintain the same interval or time between taking each dose and first decrease the dose.  Cut the total daily intake of opioids by one tablet each day Next start to increase the time between doses. The last dose that should be eliminated is the evening dose.   MAKE SURE YOU:  Understand these instructions.  Get help right away if you are not doing well or get worse.   Information on my medicine - XARELTO  (Rivaroxaban )    Why was Xarelto  prescribed for you? Xarelto  was prescribed for you to reduce the risk of blood clots forming after orthopedic surgery. The medical term for these abnormal blood clots is venous thromboembolism (VTE).  What do you need to know about xarelto  ? Take your Xarelto  ONCE DAILY at the same time every day. You may take it either with or without food.  If you have difficulty swallowing the tablet whole, you may crush it and mix in applesauce just prior to taking your dose.  Take Xarelto  exactly as prescribed by your doctor and DO NOT stop taking Xarelto  without talking to the doctor who prescribed the medication.   Stopping without other VTE prevention medication to take the place of Xarelto  may increase your risk of developing a clot.  After discharge, you should have regular check-up appointments  with your healthcare provider that is prescribing your Xarelto .    What do you do if you miss a dose? If you miss a dose, take it as soon as you remember on the same day then continue your regularly scheduled once daily regimen the next day. Do not take two doses of Xarelto  on the same day.   Important Safety Information A possible side effect of Xarelto  is bleeding. You should call your healthcare provider right away if you experience any of the following: Bleeding from an injury or your nose that does not stop. Unusual colored urine (red or dark brown) or unusual colored stools (red or black). Unusual bruising for unknown reasons. A serious fall or if you hit your head (even if there is no bleeding).  Some medicines may interact with Xarelto  and might increase your risk of bleeding while on Xarelto . To help avoid this, consult your healthcare provider or pharmacist prior to using any new prescription or non-prescription medications, including herbals, vitamins, non-steroidal anti-inflammatory drugs (NSAIDs) and supplements.  This website has more information on Xarelto : www.xarelto .com.   Thank you for letting us  be a part of your medical care team.  It is a privilege we respect greatly.  We hope these instructions will help you stay on track for a fast and full recovery!

## 2024-09-13 NOTE — Anesthesia Preprocedure Evaluation (Signed)
 Anesthesia Evaluation  Patient identified by MRN, date of birth, ID band Patient awake    Reviewed: Allergy & Precautions, NPO status , Patient's Chart, lab work & pertinent test results  History of Anesthesia Complications (+) PONV and history of anesthetic complications  Airway Mallampati: II  TM Distance: >3 FB Neck ROM: Full    Dental  (+) Upper Dentures, Partial Lower   Pulmonary neg pulmonary ROS, neg sleep apnea, neg COPD, Patient abstained from smoking.Not current smoker, former smoker   Pulmonary exam normal breath sounds clear to auscultation       Cardiovascular Exercise Tolerance: Good METS(-) hypertension(-) CAD and (-) Past MI negative cardio ROS (-) dysrhythmias  Rhythm:Regular Rate:Normal - Systolic murmurs    Neuro/Psych neg Seizures negative neurological ROS  negative psych ROS   GI/Hepatic ,neg GERD  ,,(+)     (-) substance abuse    Endo/Other  neg diabetes    Renal/GU negative Renal ROS     Musculoskeletal  (+) Arthritis ,    Abdominal   Peds  Hematology   Anesthesia Other Findings Past Medical History: No date: Allergic rhinitis No date: Arthritis No date: Cancer [facility])     Comment:  squamous cell No date: Crohn disease (HCC) No date: Crohn's disease (HCC) No date: Crohn's disease (HCC) No date: H/O bladder problems No date: History of endometriosis No date: History of small bowel obstruction     Comment:  2011--  [facility] --  RESOLVED -- NO SURGICAL INTERVENTION No date: Osteoporosis No date: Pelvic prolapse No date: PONV (postoperative nausea and vomiting)     Comment:  AND HARD TO WAKE No date: Seizure (HCC)     Comment:  hx of 2015 not followed by a neurologist , 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 No date: Steroid-induced psychosis with complication (HCC) not  followed by a neurologist     Comment:  with hx of seizure - 2015 No date: SUI (stress urinary incontinence,  female)  Reproductive/Obstetrics                              Anesthesia Physical Anesthesia Plan  ASA: 2  Anesthesia Plan: Spinal   Post-op Pain Management: Tylenol  PO (pre-op)*   Induction: Intravenous  PONV Risk Score and Plan: 3 and Ondansetron , Dexamethasone , Propofol  infusion, TIVA and Treatment may vary due to age or medical condition  Airway Management Planned: Natural Airway  Additional Equipment: None  Intra-op Plan:   Post-operative Plan:   Informed Consent: I have reviewed the patients History and Physical, chart, labs and discussed the procedure including the risks, benefits and alternatives for the proposed anesthesia with the patient or authorized representative who has indicated his/her understanding and acceptance.       Plan Discussed with: CRNA and Surgeon  Anesthesia Plan Comments: (Discussed R/B/A of neuraxial anesthesia technique with patient: - rare risks of spinal/epidural hematoma, nerve damage, infection - Risk of PDPH - Risk of nausea and vomiting - Risk of conversion to general anesthesia and its associated risks, including sore throat, damage to lips/eyes/teeth/oropharynx, and rare risks such as cardiac and respiratory events. - Risk of allergic reactions  Discussed the role of CRNA in patient's perioperative care.  Patient voiced understanding.)        Anesthesia Quick Evaluation

## 2024-09-13 NOTE — Interval H&P Note (Signed)
 History and Physical Interval Note:  09/13/2024 12:01 PM  Diane [name]  has presented today for surgery, with the diagnosis of osteoarthritis of left hip.  The various methods of treatment have been discussed with the patient and family. After consideration of risks, benefits and other options for treatment, the patient has consented to  Procedure(s): ARTHROPLASTY, HIP, TOTAL,POSTERIOR APPROACH (Left) as a surgical intervention.  The patient's history has been reviewed, patient examined, no change in status, stable for surgery.  I have reviewed the patient's chart and labs.  Questions were answered to the patient's satisfaction.     [name]

## 2024-09-13 NOTE — Transfer of Care (Signed)
 Immediate Anesthesia Transfer of Care Note  Patient: Diane [name]  Procedure(s) Performed: ARTHROPLASTY, HIP, TOTAL,POSTERIOR APPROACH (Left: Hip)  Patient Location: PACU  Anesthesia Type:General  Level of Consciousness: drowsy and responds to stimulation  Airway & Oxygen Therapy: Patient Spontanous Breathing and Patient connected to face mask oxygen  Post-op Assessment: Report given to RN and Post -op Vital signs reviewed and stable  Post vital signs: Reviewed and stable  Last Vitals:  Vitals Value Taken Time  BP 127/72 09/13/24 15:19  Temp    Pulse 88 09/13/24 15:23  Resp 10 09/13/24 15:23  SpO2 99 % 09/13/24 15:23  Vitals shown include unfiled device data.  Last Pain:  Vitals:   09/13/24 1148  TempSrc:   PainSc: 0-No pain         Complications: No notable events documented.

## 2024-09-13 NOTE — Anesthesia Postprocedure Evaluation (Signed)
 Anesthesia Post Note  Patient: Diane [name]  Procedure(s) Performed: ARTHROPLASTY, HIP, TOTAL,POSTERIOR APPROACH (Left: Hip)     Patient location during evaluation: PACU Anesthesia Type: General Level of consciousness: awake and alert and oriented Pain management: pain level controlled Vital Signs Assessment: post-procedure vital signs reviewed and stable Respiratory status: spontaneous breathing, nonlabored ventilation and respiratory function stable Cardiovascular status: blood pressure returned to baseline and stable Postop Assessment: no apparent nausea or vomiting Anesthetic complications: no   No notable events documented.  Last Vitals:  Vitals:   09/13/24 1600 09/13/24 1645  BP:    Pulse:    Resp:    Temp:  36.7 C  SpO2: 95%     Last Pain:  Vitals:   09/13/24 1645  TempSrc:   PainSc: Asleep                 Larysa Pall A.

## 2024-09-13 NOTE — Anesthesia Procedure Notes (Addendum)
 Spinal  Patient location during procedure: OR Start time: 09/13/2024 1:17 PM End time: 09/13/2024 1:29 PM Reason for block: surgical anesthesia Staffing Performed: anesthesiologist and resident/CRNA  Anesthesiologist: Boone Fess, MD Resident/CRNA: Kathern Rollene LABOR, CRNA Performed by: Boone Fess, MD Authorized by: Boone Fess, MD   Preanesthetic Checklist Completed: patient identified, IV checked, site marked, risks and benefits discussed, surgical consent, monitors and equipment checked, pre-op evaluation and timeout performed Spinal Block Patient position: sitting Prep: DuraPrep and site prepped and draped Patient monitoring: heart rate, continuous pulse ox, blood pressure and cardiac monitor Approach: midline Location: L3-4 Needle Needle type: Quincke  Needle gauge: 22 G Needle length: 9 cm Assessment Events: failed spinal Additional Notes Unable to access spinal space despite multiple attempts by CRNA and MD.  Meticulous sterile technique used throughout (CHG prep, sterile gloves, sterile drape).

## 2024-09-13 NOTE — Op Note (Signed)
 09/13/2024  2:57 PM  PATIENT:  Diane [name]   MRN: [id number]  PRE-OPERATIVE DIAGNOSIS: Left hip primary localized osteoarthritis  POST-OPERATIVE DIAGNOSIS:  same  PROCEDURE:  Procedure(s): ARTHROPLASTY, HIP, TOTAL,POSTERIOR APPROACH  PREOPERATIVE INDICATIONS:    [name] is an 73 y.o. female who has a diagnosis of left hip arthritis and elected for surgical management after failing conservative treatment.  The risks benefits and alternatives were discussed with the patient including but not limited to the risks of nonoperative treatment, versus surgical intervention including infection, bleeding, nerve injury, periprosthetic fracture, the need for revision surgery, dislocation, leg length discrepancy, blood clots, cardiopulmonary complications, morbidity, mortality, among others, and they were willing to proceed.     OPERATIVE REPORT     SURGEON:  Fonda Olmsted, MD    ASSISTANT:  Army Daring, PA-C, (Present throughout the entire procedure,  necessary for completion of procedure in a timely manner, assisting with retraction, instrumentation, and closure)     ANESTHESIA: Spinal attempted, but unsuccessful, and converted to general  ESTIMATED BLOOD LOSS: 200 mL    COMPLICATIONS:  None.     UNIQUE ASPECTS OF THE CASE: As predicted from her contralateral side, bone quality was mediocre.  The acetabulum was actually fairly shallow, I did medialize almost completely to the medial wall, but still had uncovered beads superiorly as well as posteriorly.  I had just a little bit of anterior wall showing.  I did have good fixation, and placed a augmentation screw although the cancellous bone in the roof of the acetabulum was fairly poor quality, and although the screw fully seated, it was not a great bite.  This was effectively the exact same as her contralateral side.  I debated about using a +1.5 versus a size 5 neck length, and the size 5 had better stability, and felt like it  restore the leg lengths more equally.  COMPONENTS:  Implant Name: ARMAN DAKIN APEX DEPUY - [id number] Type: Hips Inv. Item: ELIMINATOR HOLE APEX DEPUY Serial No.:  Manufacturer: [employer] Lot No.: [id number] LRB: Left No. Used: 1 Action: Implanted   Implant Name: PIN SECTOR W/GRIP ACE CUP - [id number] Type: Hips Inv. Item: PIN SECTOR W/GRIP ACE CUP Serial No.:  Manufacturer: [employer] Lot No.: [id number] LRB: Left No. Used: 1 Action: Implanted   Implant Name: SCREW 6.5MMX25MM - [id number] Type: Screw Inv. Item: SCREW 6.5MMX25MM Serial No.:  Manufacturer: [employer] Lot No.: [id number] LRB: Left No. Used: 1 Action: Implanted   Implant Name: LINER NEUTRAL 52X36MM PLUS 4 - [id number] Type: Liner Inv. Item: LINER NEUTRAL 52X36MM PLUS 4 Serial No.:  Manufacturer: [employer] Lot No.: [id number] LRB: Left No. Used: 1 Action: Implanted   Implant Name: STEM FEM ACTIS STD SZ7 - [id number] Type: Nail Inv. Item: STEM FEM ACTIS STD SZ7 Serial No.:  Manufacturer: [employer] Lot No.: [id number] LRB: Left No. Used: 1 Action: Implanted   Implant Name: HEAD CERAMIC 36 PLUS5 - [id number] Type: Hips Inv. Item: HEAD CERAMIC 36 PLUS5 Serial No.:  Manufacturer: [employer] Lot No.: [id number] LRB: Left No. Used: 1 Action: Implanted     PROCEDURE IN DETAIL:   The patient was met in the holding area and  identified.  The appropriate hip was identified and marked at the operative site.  The patient was then transported to the OR  and  placed under anesthesia.  At that point, the patient was  placed in the lateral decubitus position with the operative side up and  secured to the operating room table and all bony prominences padded.     The operative lower extremity was prepped from the iliac crest to the distal leg.  Sterile draping was performed.  Time out was performed prior to incision.      A routine posterolateral  approach was utilized via sharp dissection  carried down through the subcutaneous tissue.  Gross bleeders were Bovie coagulated.  The iliotibial band was identified and incised along the length of the skin incision.  Self-retaining retractors were  inserted.  With the hip internally rotated, the short external rotators  were identified. The piriformis and capsule was tagged with Ethibond, and the hip capsule released in a T-type fashion.  The femoral neck was exposed, and I resected the femoral neck using the appropriate jig. This was performed at approximately a thumb's breadth above the lesser trochanter.    I then exposed the deep acetabulum, cleared out any tissue including the ligamentum teres.  A wing retractor was placed.  After adequate visualization, I excised the labrum, and then sequentially reamed.  I placed the trial acetabulum, which seated nicely, and then impacted the real cup into place.  Appropriate version and inclination was confirmed clinically matching their bony anatomy, and also with the use of the jig.  I placed a cancellous screw to augment fixation.  A trial polyethylene liner was placed and the wing retractor removed.    I then prepared the proximal femur using the cookie-cutter, the lateralizing reamer, and then sequentially reamed and broached.  A trial broach, neck, and head was utilized, and I reduced the hip and it was found to have excellent stability with functional range of motion. The trial components were then removed, and the real polyethylene liner was placed.  I then impacted the real femoral prosthesis into place into the appropriate version, slightly anteverted to the normal anatomy, and I impacted the real head ball into place. The hip was then reduced and taken through functional range of motion and found to have excellent stability. Leg lengths were restored.  I then used a 2 mm drill bits to pass the Ethibond suture from the capsule and piriformis through  the greater trochanter, and secured this. Excellent posterior capsular repair was achieved. I also closed the T in the capsule.  I then irrigated the hip copiously again, and repaired the fascia with Stratafix, followed by Vicryl for the subcutaneous tissue, Monocryl for the skin, Steri-Strips and sterile gauze. The wounds were injected. The patient was then awakened and returned to PACU in stable and satisfactory condition. There were no complications.  Fonda Olmsted, MD Orthopedic Surgeon [phone]   09/13/2024 2:57 PM

## 2024-09-14 ENCOUNTER — Encounter (HOSPITAL_COMMUNITY): Payer: Self-pay | Admitting: Orthopedic Surgery

## 2024-09-14 DIAGNOSIS — M1612 Unilateral primary osteoarthritis, left hip: Secondary | ICD-10-CM | POA: Diagnosis not present

## 2024-09-14 LAB — BASIC METABOLIC PANEL WITH GFR
Anion gap: 9 (ref 5–15)
BUN: 9 mg/dL (ref 8–23)
CO2: 26 mmol/L (ref 22–32)
Calcium: 8.9 mg/dL (ref 8.9–10.3)
Chloride: 103 mmol/L (ref 98–111)
Creatinine, Ser: 0.76 mg/dL (ref 0.44–1.00)
GFR, Estimated: >60 mL/min (ref >60)
Glucose, Bld: 137 mg/dL — ABNORMAL HIGH (ref 70–99)
Potassium: 4.1 mmol/L (ref 3.5–5.1)
Sodium: 138 mmol/L (ref 135–145)

## 2024-09-14 LAB — CBC
HCT: 32.5 % — ABNORMAL LOW (ref 36.0–46.0)
Hemoglobin: 10.5 g/dL — ABNORMAL LOW (ref 12.0–15.0)
MCH: 30.3 pg (ref 26.0–34.0)
MCHC: 32.3 g/dL (ref 30.0–36.0)
MCV: 93.7 fL (ref 80.0–100.0)
Platelets: 262 K/uL (ref 150–400)
RBC: 3.47 MIL/uL — ABNORMAL LOW (ref 3.87–5.11)
RDW: 13.9 % (ref 11.5–15.5)
WBC: 10.8 K/uL — ABNORMAL HIGH (ref 4.0–10.5)
nRBC: 0 % (ref 0.0–0.2)

## 2024-09-14 MED ORDER — OXYCODONE HCL 5 MG PO TABS: 5.0000 mg | ORAL_TABLET | ORAL | 0 refills | Status: AC | PRN

## 2024-09-14 MED ORDER — PROMETHAZINE HCL 25 MG PO TABS: 25.0000 mg | ORAL_TABLET | Freq: Four times a day (QID) | ORAL | 0 refills | Status: AC | PRN

## 2024-09-14 MED ORDER — RIVAROXABAN 10 MG PO TABS: 10.0000 mg | ORAL_TABLET | Freq: Every day | ORAL | 0 refills | Status: AC

## 2024-09-14 MED ORDER — SENNA-DOCUSATE SODIUM 8.6-50 MG PO TABS
2.0000 | ORAL_TABLET | Freq: Every day | ORAL | 1 refills | Status: AC
Start: 2024-09-14 — End: ?

## 2024-09-14 NOTE — Plan of Care (Signed)
  Problem: Education: Goal: Knowledge of the prescribed therapeutic regimen will improve Outcome: Adequate for Discharge Goal: Understanding of discharge needs will improve Outcome: Adequate for Discharge Goal: Individualized Educational Video(s) Outcome: Adequate for Discharge   Problem: Activity: Goal: Ability to avoid complications of mobility impairment will improve Outcome: Adequate for Discharge Goal: Ability to tolerate increased activity will improve Outcome: Adequate for Discharge   Problem: Clinical Measurements: Goal: Postoperative complications will be avoided or minimized Outcome: Adequate for Discharge   Problem: Pain Management: Goal: Pain level will decrease with appropriate interventions Outcome: Adequate for Discharge   Problem: Skin Integrity: Goal: Will show signs of wound healing Outcome: Adequate for Discharge   Problem: Education: Goal: Knowledge of General Education information will improve Description: Including pain rating scale, medication(s)/side effects and non-pharmacologic comfort measures Outcome: Adequate for Discharge   Problem: Health Behavior/Discharge Planning: Goal: Ability to manage health-related needs will improve Outcome: Adequate for Discharge   Problem: Clinical Measurements: Goal: Ability to maintain clinical measurements within normal limits will improve Outcome: Adequate for Discharge Goal: Will remain free from infection Outcome: Adequate for Discharge Goal: Diagnostic test results will improve Outcome: Adequate for Discharge Goal: Respiratory complications will improve Outcome: Adequate for Discharge Goal: Cardiovascular complication will be avoided Outcome: Adequate for Discharge   Problem: Activity: Goal: Risk for activity intolerance will decrease Outcome: Adequate for Discharge   Problem: Nutrition: Goal: Adequate nutrition will be maintained Outcome: Adequate for Discharge   Problem: Coping: Goal: Level of  anxiety will decrease Outcome: Adequate for Discharge   Problem: Elimination: Goal: Will not experience complications related to bowel motility Outcome: Adequate for Discharge Goal: Will not experience complications related to urinary retention Outcome: Adequate for Discharge   Problem: Pain Managment: Goal: General experience of comfort will improve and/or be controlled Outcome: Adequate for Discharge   Problem: Safety: Goal: Ability to remain free from injury will improve Outcome: Adequate for Discharge   Problem: Skin Integrity: Goal: Risk for impaired skin integrity will decrease Outcome: Adequate for Discharge

## 2024-09-14 NOTE — Evaluation (Signed)
 Physical Therapy Evaluation Patient Details Name: Diane [name] MRN: [id number] DOB: 1951/07/16 Today's Date: 09/14/2024  History of Present Illness  73 y.o. female admitted 09/13/24 for L THA, posterior approach with no formal precautions. PMH: OA, Crohns, osteoporosis, pelvic prolapse repair.  Clinical Impression  Pt is mobilizing well, she ambulated 125' with RW, no loss of balance. Stair training completed. Pt demonstrates good understanding of HEP. She is ready to DC home from a PT standpoint.        If plan is discharge home, recommend the following: A little help with bathing/dressing/bathroom;Assistance with cooking/housework;Assist for transportation;Help with stairs or ramp for entrance   Can travel by private vehicle        Equipment Recommendations None recommended by PT  Recommendations for Other Services       Functional Status Assessment Patient has had a recent decline in their functional status and demonstrates the ability to make significant improvements in function in a reasonable and predictable amount of time.     Precautions / Restrictions Precautions Precautions: Fall Recall of Precautions/Restrictions: Intact Precaution/Restrictions Comments: no formal precautions Restrictions Weight Bearing Restrictions Per Provider Order: No Other Position/Activity Restrictions: WBAT LLE      Mobility  Bed Mobility Overal bed mobility: Needs Assistance Bed Mobility: Supine to Sit     Supine to sit: Min assist     General bed mobility comments: used gait belt for LLE, min A to pivot hips, VCs technique    Transfers Overall transfer level: Needs assistance Equipment used: Rolling walker (2 wheels) Transfers: Sit to/from Stand Sit to Stand: Contact guard assist           General transfer comment: VCs hand placement    Ambulation/Gait Ambulation/Gait assistance: Supervision Gait Distance (Feet): 125 Feet Assistive device: Rolling walker (2  wheels) Gait Pattern/deviations: Step-to pattern Gait velocity: decr     General Gait Details: VCs sequencing, no loss of balance  Stairs Stairs: Yes Stairs assistance: Min assist Stair Management: No rails, Backwards, Step to pattern, With walker Number of Stairs: 3 General stair comments: VCs sequencing, min A to steady RW  Wheelchair Mobility     Tilt Bed    Modified Rankin (Stroke Patients Only)       Balance Overall balance assessment: Modified Independent                                           Pertinent Vitals/Pain Pain Assessment Pain Assessment: 0-10 Pain Score: 5  Pain Location: L hip with walking Pain Descriptors / Indicators: Sore Pain Intervention(s): Limited activity within patient's tolerance, Monitored during session, Premedicated before session, Ice applied    Home Living Family/patient expects to be discharged to:: Private residence Living Arrangements: Spouse/significant other Available Help at Discharge: Family;Available 24 hours/day   Home Access: Stairs to enter Entrance Stairs-Rails: None Entrance Stairs-Number of Steps: 3   Home Layout: One level Home Equipment: Agricultural [employer]ant (2 wheels);BSC/3in1;Cane - single point;Adaptive equipment      Prior Function Prior Level of Function : Independent/Modified Independent             Mobility Comments: walked without AD, no falls in 6 months ADLs Comments: stopped driving 2 weeks PTA bc hip got so bad, independent ADLs     Extremity/Trunk Assessment   Upper Extremity Assessment Upper Extremity Assessment: Overall WFL for tasks assessed    Lower Extremity  Assessment Lower Extremity Assessment: LLE deficits/detail LLE Deficits / Details: hip flexion AAROM ~35*, abduction ~15*, knee ext at least 3/5 LLE Sensation: WNL LLE Coordination: WNL    Cervical / Trunk Assessment Cervical / Trunk Assessment: Normal  Communication   Communication Communication: No  apparent difficulties    Cognition Arousal: Alert Behavior During Therapy: WFL for tasks assessed/performed   PT - Cognitive impairments: No apparent impairments                         Following commands: Intact       Cueing Cueing Techniques: Verbal cues     General Comments      Exercises Total Joint Exercises Ankle Circles/Pumps: AROM, Both, 10 reps, Supine Quad Sets: AROM, Both, 5 reps, Supine Short Arc Quad: AROM, Left, 5 reps, Supine Heel Slides: AAROM, Left, 10 reps, Supine Hip ABduction/ADduction: AAROM, Left, 5 reps, Supine Long Arc Quad: AROM, Left, 10 reps, Seated   Assessment/Plan    PT Assessment All further PT needs can be met in the next venue of care  PT Problem List Decreased activity tolerance;Decreased mobility       PT Treatment Interventions      PT Goals (Current goals can be found in the Care Plan section)  Acute Rehab PT Goals Patient Stated Goal: return to driving, running errands PT Goal Formulation: All assessment and education complete, DC therapy    Frequency       Co-evaluation               AM-PAC PT 6 Clicks Mobility  Outcome Measure Help needed turning from your back to your side while in a flat bed without using bedrails?: A Little Help needed moving from lying on your back to sitting on the side of a flat bed without using bedrails?: A Little Help needed moving to and from a bed to a chair (including a wheelchair)?: None Help needed standing up from a chair using your arms (e.g., wheelchair or bedside chair)?: None Help needed to walk in hospital room?: None Help needed climbing 3-5 steps with a railing? : A Little 6 Click Score: 21    End of Session Equipment Utilized During Treatment: Gait belt Activity Tolerance: Patient tolerated treatment well Patient left: in chair;with call bell/phone within reach;with chair alarm set Nurse Communication: Mobility status      Time: 9154-9045 PT Time  Calculation (min) (ACUTE ONLY): 69 min   Charges:   PT Evaluation $PT Eval Moderate Complexity: 1 Mod PT Treatments $Gait Training: 23-37 mins $Therapeutic Exercise: 8-22 mins $Therapeutic Activity: 8-22 mins PT General Charges $$ ACUTE PT VISIT: 1 Visit        Sylvan Delon Copp PT 09/14/2024  Acute Rehabilitation Services  Office [phone]

## 2024-09-14 NOTE — Discharge Summary (Signed)
 Physician Discharge Summary  Patient ID: Diane [name] MRN: [id number] DOB/AGE: 1951-02-09 73 y.o.  Admit date: 09/13/2024 Discharge date: 09/14/2024  Admission Diagnoses:  S/P total left hip arthroplasty  Discharge Diagnoses:  Principal Problem:   S/P total left hip arthroplasty   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 female who was admitted 09/13/2024 with a diagnosis of S/P total left hip arthroplasty and went to the operating room on 09/13/2024 and underwent the above named procedures.    She was given perioperative antibiotics:  Anti-infectives (From admission, onward)    Start     Dose/Rate Route Frequency Ordered Stop   09/13/24 2000  ceFAZolin  (ANCEF ) IVPB 2g/100 mL premix        2 g 200 mL/hr over 30 Minutes Intravenous Every 6 hours 09/13/24 1752 09/14/24 0303   09/13/24 1115  ceFAZolin  (ANCEF ) IVPB 2g/100 mL premix        2 g 200 mL/hr over 30 Minutes Intravenous On call to O.R. 09/13/24 1106 09/13/24 1332     .  She was given sequential compression devices, early ambulation, and xarelto  for DVT prophylaxis.  She benefited maximally from the  hospital stay and there were no complications.    Inpatient Morphine  Milligram Equivalents Per Day 11/11 - 11/12   Values displayed are in units of MME/Day    Order Start / End Date Yesterday Today    oxyCODONE  (Oxy IR/ROXICODONE ) immediate release tablet 5 mg 11/11 - 11/11 7.5 of Unknown --    oxyCODONE  (ROXICODONE ) 5 MG/5ML solution 5 mg 11/11 - 11/11 0 of Unknown --      Group total: 7.5 of Unknown     HYDROmorphone  (DILAUDID ) injection 0.5-1 mg 11/11 - No end date 0 of 20-40 0 of 60-120    oxyCODONE  (Oxy IR/ROXICODONE ) immediate release tablet 5 mg 11/11 - No end date 0 of 15 15 of 45    oxyCODONE  (Oxy IR/ROXICODONE ) immediate release tablet 10 mg 11/11 - No end date 0 of 30 0 of 90    fentaNYL  (SUBLIMAZE ) injection 11/11 - 11/11 *30 of 30 --    HYDROmorphone  (DILAUDID ) injection 11/11 - 11/11 *20 of 20 --    HYDROmorphone  (DILAUDID ) injection 0.25-0.5 mg 11/11 - 11/11 20 of 40-80 --    Daily Totals  * 77.5 of Unknown (at least 155-215) 15 of 195-255  *One-Step medication  Calculation Errors     Order Type Date Details   oxyCODONE  (Oxy IR/ROXICODONE ) immediate release tablet 5 mg Ordered Dose --  Insufficient frequency information   oxyCODONE  (ROXICODONE ) 5 MG/5ML solution 5 mg Ordered Dose -- Insufficient frequency information            Recent vital signs:  Vitals:   09/14/24 0240 09/14/24 0605  BP: (!) 116/58 112/62  Pulse: 88 95  Resp: 18 18  Temp: 97.6 F (36.4 C) 98.4 F (36.9 C)  SpO2: 100% 100%    Recent laboratory studies:  Lab Results  Component Value Date   HGB 10.5 (L) 09/14/2024   HGB 12.2 08/31/2024   HGB 11.6 (L) 01/07/2023   Lab Results  Component Value Date   WBC 10.8 (H) 09/14/2024   PLT 262 09/14/2024   Lab Results  Component Value Date   INR 1.54 03/29/2017   Lab Results  Component Value Date   NA 138 09/14/2024   K 4.1 09/14/2024   CL 103 09/14/2024   CO2 26 09/14/2024   BUN 9 09/14/2024   CREATININE 0.76 09/14/2024   GLUCOSE 137  (H) 09/14/2024    Discharge Medications:   Allergies as of 09/14/2024       Reactions   Celebrex [celecoxib] Anaphylaxis   Prednisone  Other (See Comments)   Severe psychosis requiring hospitalization.  Avoid use if possible.     Actonel [risedronate Sodium] Other (See Comments)   GI UPSET   Albuterol Other (See Comments)   sobbing   Ammonium-containing Compounds    Unknown reaction   Amoxicillin Diarrhea, Nausea And Vomiting   Boniva [ibandronate] Other (See Comments)   GI UPSET   Calcitonin Other (See Comments)   unknown reaction   Chantix [varenicline] Other (See Comments)   MOOD CHANGE   Fosamax [alendronate Sodium] Other (See Comments)   GI UPSET   Imuran [azathioprine] Diarrhea, Nausea And Vomiting   Nitrofuran Derivatives Other (See Comments)   CHILLS AND FEVER   Penicillins Diarrhea, Nausea And Vomiting, Other (See Comments)   Tolerated Ancef  09/14/24   Prolia [denosumab] Other (See Comments)   Irritates chrons   Tobradex [tobramycin-dexamethasone ] Other (See Comments)   SEVERE EYE IRRIATION   Vagifem  [estradiol ] Other (See Comments)   bleeding   Wound Dressing Adhesive Other (See Comments)   skin tears   Neosporin [neomycin-bacitracin Zn-polymyx] Rash        Medication List     TAKE these medications    acetaminophen  500 MG tablet Commonly known as: TYLENOL  Take 1,000 mg by mouth every 8 (eight) hours as needed for mild pain.   ALIGN PO Take 1 capsule by mouth at bedtime.   Calcium 600+D3 Plus Minerals 600-800 MG-UNIT Tabs Take 2 tablets by mouth in the morning.   chlorhexidine  4 % external liquid Commonly known as: HIBICLENS  Apply 15 mLs (1 Application total) topically as directed for 30 doses. Use as directed daily for 5 days every other week for 6 weeks.   mesalamine  0.375 g 24 hr capsule Commonly known as: APRISO  Take 1.5 g by mouth every morning.   mupirocin ointment 2 % Commonly known as: BACTROBAN Place 1 Application into the nose 2  (two) times daily for 60 doses. Use as directed 2 times daily for 5 days every other week for 6 weeks.   oxyCODONE  5 MG immediate release tablet Commonly known as: Roxicodone  Take 1 tablet (5 mg total) by mouth every 4 (four) hours as needed for severe pain (pain score 7-10).   promethazine  25 MG tablet Commonly known as: PHENERGAN  Take 1 tablet (25 mg total) by mouth every 6 (six) hours  as needed for nausea or vomiting.   rivaroxaban  10 MG Tabs tablet Commonly known as: Xarelto  Take 1 tablet (10 mg total) by mouth daily.   sennosides-docusate sodium  8.6-50 MG tablet Commonly known as: SENOKOT-S Take 2 tablets by mouth daily.   SYSTANE OP Place 1-3 drops into both eyes 2 (two) times daily.        Diagnostic Studies: DG HIP UNILAT W OR W/O PELVIS 2-3 VIEWS LEFT Result Date: 09/13/2024 CLINICAL DATA:  Status post left hip arthroplasty EXAM: DG HIP (WITH OR WITHOUT PELVIS) 2-3V LEFT COMPARISON:  None Available. FINDINGS: Bilateral hip replacements with intact hardware and normal alignment. Gas in the left hip soft tissues consistent with recent surgery. Pubic symphysis and rami appear intact. No fracture. IMPRESSION: Status post left hip replacement with expected postsurgical change. Electronically Signed   By: Luke Bun M.D.   On: 09/13/2024 16:28    Disposition: Discharge disposition: 06-Home-Health Care Svc          Follow-up Information     Josefina Chew, MD. Go on 09/28/2024.   Specialty: Orthopedic Surgery Why: your appointment is scheduled for 11:00 Contact information: [address] [place] [address] [phone]         [facility] Follow up.   Why: HHPT will provide 6 home visits prior to starting [facility] Go on 09/28/2024.   Why: your outpatient physical therapy is scheduled for 10:15. Please arrive at 10;00 Contact information: [facility] [address] [place] [address] [phone]                  Signed: Army MARLA Daring 09/14/2024, 9:42 AM

## 2024-09-14 NOTE — Progress Notes (Signed)
 Subjective: 1 Day Post-Op s/p Procedure(s): ARTHROPLASTY, HIP, TOTAL,POSTERIOR APPROACH   Patient is alert, oriented. Patient reports pain as well controlled this morning, but pain was severe a few times overnight when trying to get back in the bed after going to the bathroom. Had some vomiting last night but does not know exactly what it was associated with.  Voiding on own Denies chest pain, SOB, Calf pain.  No other complaints.  Objective:  PE: VITALS:   Vitals:   09/13/24 1957 09/13/24 2300 09/14/24 0240 09/14/24 0605  BP: (!) 108/58 (!) 110/57 (!) 116/58 112/62  Pulse: 88 81 88 95  Resp: 18 16 18 18   Temp: (!) 97.3 F (36.3 C) 97.8 F (36.6 C) 97.6 F (36.4 C) 98.4 F (36.9 C)  TempSrc: Oral     SpO2: (!) 88% 100% 100% 100%  Weight:      Height:       Sitting up in bed, in no acute distress MSK: Dressing CDI, I changed to new aquacell today, the mepilex put on intra-op had some tearing from the ice packs around her. Dorsiflexion and plantarflexion intact at the ankle. Sensation intact diffusely at the foot.    LABS  Results for orders placed or performed during the hospital encounter of 09/13/24 (from the past 24 hours)  ABO/Rh     Status: None   Collection Time: 09/13/24 12:03 PM  Result Value Ref Range   ABO/RH(D)      A POS Performed at [facility], [address], [place], [place] [address]   CBC     Status: Abnormal   Collection Time: 09/14/24  3:28 AM  Result Value Ref Range   WBC 10.8 (H) 4.0 - 10.5 K/uL   RBC 3.47 (L) 3.87 - 5.11 MIL/uL   Hemoglobin 10.5 (L) 12.0 - 15.0 g/dL   HCT 67.4 (L) 63.9 - 53.9 %   MCV 93.7 80.0 - 100.0 fL   MCH 30.3 26.0 - 34.0 pg   MCHC 32.3 30.0 - 36.0 g/dL   RDW 86.0 88.4 - 84.4 %   Platelets 262 150 - 400 K/uL   nRBC 0.0 0.0 - 0.2 %  Basic metabolic panel     Status: Abnormal   Collection Time: 09/14/24  3:28 AM  Result Value Ref Range   Sodium 138 135 - 145 mmol/L   Potassium 4.1 3.5 -  5.1 mmol/L   Chloride 103 98 - 111 mmol/L   CO2 26 22 - 32 mmol/L   Glucose, Bld 137 (H) 70 - 99 mg/dL   BUN 9 8 - 23 mg/dL   Creatinine, Ser 9.23 0.44 - 1.00 mg/dL   Calcium 8.9 8.9 - 89.6 mg/dL   GFR, Estimated >39 >39 mL/min   Anion gap 9 5 - 15    DG HIP UNILAT W OR W/O PELVIS 2-3 VIEWS LEFT Result Date: 09/13/2024 CLINICAL DATA:  Status post left hip arthroplasty EXAM: DG HIP (WITH OR WITHOUT PELVIS) 2-3V LEFT COMPARISON:  None Available. FINDINGS: Bilateral hip replacements with intact hardware and normal alignment. Gas in the left hip soft tissues consistent with recent surgery. Pubic symphysis and rami appear intact. No fracture. IMPRESSION: Status post left hip replacement with expected postsurgical change. Electronically Signed   By: Luke Bun M.D.   On: 09/13/2024 16:28    Today's  total administered Morphine  Milligram Equivalents: 15 Yesterday's total administered Morphine  Milligram Equivalents: 77.5  Assessment/Plan: Principal Problem:   S/P total left  hip arthroplasty  1 Day Post-Op s/p Procedure(s): ARTHROPLASTY, HIP, TOTAL,POSTERIOR APPROACH - stable post op imaging - will make medication changes if continue to have vomiting today  Weightbearing: WBAT LLE Insicional and dressing care: Reinforce dressings as needed VTE prophylaxis: Xarelto  10 mg every day for 30 days postop, started today.  Pain control: continue current regimen.  Follow - up plan: 2 weeks with Dr. Josefina Dispo: pending PT/OT eval, patient hopeful to discharge home this afternoon   Contact information:   Army Daring, PA-C Weekdays 8-5  After hours and holidays please check [email or web address] for group call information for [facility]  Army MARLA Daring 09/14/2024, 9:14 AM

## 2024-09-14 NOTE — TOC Transition Note (Signed)
 Transition of Care [facility]) - Discharge Note   Patient Details  Name: Diane [name] MRN: [id number] Date of Birth: 1951/08/16  [facility] CM/SW Contact:  NORMAN ASPEN, LCSW Phone Number: 09/14/2024, 1:16 PM   Clinical Narrative:     Met with pt who confirms she has needed DME in the home.  Pt aware HHPT prearranged with [facility] via ortho MD office prior to surgery.  No further IP CM needs.  Final next level of care: Home w Home Health Services Barriers to Discharge: No Barriers Identified   Patient Goals and CMS Choice Patient states their goals for this hospitalization and ongoing recovery are:: return home          Discharge Placement                       Discharge Plan and Services Additional resources added to the After Visit Summary for                  DME Arranged: N/A DME Agency: NA       HH Arranged: PT HH Agency: [facility]        Social Drivers of Health (SDOH) Interventions SDOH Screenings   Food Insecurity: No Food Insecurity (09/13/2024)  Housing: Low Risk  (09/13/2024)  Transportation Needs: No Transportation Needs (09/13/2024)  Utilities: Not At Risk (09/13/2024)  Social Connections: Unknown (09/13/2024)  Recent Concern: Social Connections - Moderately Isolated (09/13/2024)  Tobacco Use: Medium Risk (09/13/2024)     Readmission Risk Interventions     No data to display

## 2024-10-04 ENCOUNTER — Other Ambulatory Visit (HOSPITAL_BASED_OUTPATIENT_CLINIC_OR_DEPARTMENT_OTHER): Payer: Self-pay | Admitting: Internal Medicine

## 2024-10-04 DIAGNOSIS — M810 Age-related osteoporosis without current pathological fracture: Secondary | ICD-10-CM
